# Patient Record
Sex: Male | Born: 1945 | Race: White | Hispanic: No | Marital: Married | Smoking: Former smoker
Health system: Southern US, Community
[De-identification: ages and names within clinical notes are randomized; demographics above are authoritative.]

## PROBLEM LIST (undated history)

## (undated) DIAGNOSIS — I2510 Atherosclerotic heart disease of native coronary artery without angina pectoris: Secondary | ICD-10-CM

## (undated) DIAGNOSIS — G2581 Restless legs syndrome: Secondary | ICD-10-CM

## (undated) DIAGNOSIS — M5136 Other intervertebral disc degeneration, lumbar region: Secondary | ICD-10-CM

## (undated) DIAGNOSIS — G4733 Obstructive sleep apnea (adult) (pediatric): Secondary | ICD-10-CM

## (undated) DIAGNOSIS — C61 Malignant neoplasm of prostate: Secondary | ICD-10-CM

## (undated) DIAGNOSIS — M51369 Other intervertebral disc degeneration, lumbar region without mention of lumbar back pain or lower extremity pain: Secondary | ICD-10-CM

## (undated) DIAGNOSIS — G4700 Insomnia, unspecified: Secondary | ICD-10-CM

## (undated) DIAGNOSIS — E119 Type 2 diabetes mellitus without complications: Secondary | ICD-10-CM

## (undated) DIAGNOSIS — I10 Essential (primary) hypertension: Secondary | ICD-10-CM

## (undated) DIAGNOSIS — K219 Gastro-esophageal reflux disease without esophagitis: Secondary | ICD-10-CM

## (undated) DIAGNOSIS — J45909 Unspecified asthma, uncomplicated: Secondary | ICD-10-CM

## (undated) DIAGNOSIS — J309 Allergic rhinitis, unspecified: Secondary | ICD-10-CM

## (undated) DIAGNOSIS — J189 Pneumonia, unspecified organism: Secondary | ICD-10-CM

## (undated) HISTORY — DX: Atherosclerotic heart disease of native coronary artery without angina pectoris: I25.10

## (undated) HISTORY — DX: Essential (primary) hypertension: I10

## (undated) HISTORY — DX: Other intervertebral disc degeneration, lumbar region: M51.36

## (undated) HISTORY — DX: Malignant neoplasm of prostate: C61

## (undated) HISTORY — DX: Unspecified asthma, uncomplicated: J45.909

## (undated) HISTORY — PX: TONSILLECTOMY: SUR1361

## (undated) HISTORY — DX: Obstructive sleep apnea (adult) (pediatric): G47.33

## (undated) HISTORY — PX: CORONARY ANGIOPLASTY: SHX604

## (undated) HISTORY — DX: Type 2 diabetes mellitus without complications: E11.9

## (undated) HISTORY — DX: Insomnia, unspecified: G47.00

## (undated) HISTORY — DX: Allergic rhinitis, unspecified: J30.9

## (undated) HISTORY — DX: Gastro-esophageal reflux disease without esophagitis: K21.9

## (undated) HISTORY — DX: Other intervertebral disc degeneration, lumbar region without mention of lumbar back pain or lower extremity pain: M51.369

---

## 1981-05-26 HISTORY — PX: OTHER SURGICAL HISTORY: SHX169

## 2002-05-17 ENCOUNTER — Ambulatory Visit (HOSPITAL_COMMUNITY): Admission: RE | Admit: 2002-05-17 | Discharge: 2002-05-17 | Payer: Self-pay | Admitting: Family Medicine

## 2002-05-17 ENCOUNTER — Encounter: Payer: Self-pay | Admitting: Family Medicine

## 2003-09-07 ENCOUNTER — Ambulatory Visit (HOSPITAL_COMMUNITY): Admission: RE | Admit: 2003-09-07 | Discharge: 2003-09-07 | Payer: Self-pay | Admitting: Family Medicine

## 2003-09-15 ENCOUNTER — Other Ambulatory Visit: Payer: Self-pay

## 2007-03-05 ENCOUNTER — Ambulatory Visit (HOSPITAL_COMMUNITY): Admission: RE | Admit: 2007-03-05 | Discharge: 2007-03-05 | Payer: Self-pay | Admitting: Family Medicine

## 2007-03-05 ENCOUNTER — Encounter: Payer: Self-pay | Admitting: Orthopedic Surgery

## 2007-04-12 ENCOUNTER — Ambulatory Visit: Payer: Self-pay | Admitting: Orthopedic Surgery

## 2007-04-12 DIAGNOSIS — M25569 Pain in unspecified knee: Secondary | ICD-10-CM

## 2007-04-12 DIAGNOSIS — I10 Essential (primary) hypertension: Secondary | ICD-10-CM | POA: Insufficient documentation

## 2007-04-12 DIAGNOSIS — M1710 Unilateral primary osteoarthritis, unspecified knee: Secondary | ICD-10-CM

## 2007-04-12 DIAGNOSIS — E1159 Type 2 diabetes mellitus with other circulatory complications: Secondary | ICD-10-CM | POA: Insufficient documentation

## 2009-05-26 HISTORY — PX: PROSTATECTOMY: SHX69

## 2009-12-11 ENCOUNTER — Encounter (INDEPENDENT_AMBULATORY_CARE_PROVIDER_SITE_OTHER): Payer: Self-pay | Admitting: *Deleted

## 2009-12-31 ENCOUNTER — Encounter: Payer: Self-pay | Admitting: Gastroenterology

## 2009-12-31 ENCOUNTER — Ambulatory Visit: Payer: Self-pay | Admitting: Gastroenterology

## 2009-12-31 DIAGNOSIS — D649 Anemia, unspecified: Secondary | ICD-10-CM

## 2009-12-31 DIAGNOSIS — K625 Hemorrhage of anus and rectum: Secondary | ICD-10-CM

## 2010-01-09 ENCOUNTER — Ambulatory Visit (HOSPITAL_COMMUNITY): Admission: RE | Admit: 2010-01-09 | Discharge: 2010-01-09 | Payer: Self-pay | Admitting: Gastroenterology

## 2010-01-09 ENCOUNTER — Ambulatory Visit: Payer: Self-pay | Admitting: Gastroenterology

## 2010-01-15 ENCOUNTER — Ambulatory Visit (HOSPITAL_COMMUNITY): Admission: RE | Admit: 2010-01-15 | Discharge: 2010-01-15 | Payer: Self-pay | Admitting: Family Medicine

## 2010-01-16 ENCOUNTER — Encounter: Payer: Self-pay | Admitting: Gastroenterology

## 2010-01-25 ENCOUNTER — Encounter: Payer: Self-pay | Admitting: Gastroenterology

## 2010-01-30 LAB — CONVERTED CEMR LAB
BUN: 20 mg/dL (ref 6–23)
Calcium: 9.1 mg/dL (ref 8.4–10.5)
Creatinine, Ser: 1.07 mg/dL (ref 0.40–1.50)
Ferritin: 21 ng/mL — ABNORMAL LOW (ref 22–322)

## 2010-06-13 ENCOUNTER — Encounter (INDEPENDENT_AMBULATORY_CARE_PROVIDER_SITE_OTHER): Payer: Self-pay | Admitting: *Deleted

## 2010-06-25 NOTE — Miscellaneous (Signed)
Summary: Orders Update  Clinical Lists Changes  Orders: Added new Test order of T-CBC w/Diff [phone]) - Signed Added new Test order of T-Basic Metabolic Panel [phone]) - Signed Added new Test order of T-Ferritin [phone]) - Signed

## 2010-06-25 NOTE — Assessment & Plan Note (Signed)
Summary: ANEMIA,CONSULT FOR TCS/SS   Visit Type:  consult Referring Provider:  Oval Linsey Primary Care Provider:  Oval Linsey  Chief Complaint:  anemia and consult for tcs.  History of Present Illness: Johnny Huber is a pleasant 65 y/o WM, patient of Dr. Janna Arch, who presents for further evaluation of anemia and colonoscopy. BM regular. No abd pain. Appetite good. No heartburn. No dysphagia. Some brbpr on toilet tissue occasionally. No melena. Weight stable. No prior TCS.   On Naproxen since 2007, for OA of right knee. Rarely takes Advil. No other NSAIDS.  Recent Hgb reportedly 9.6. Per patient, hemoccult negative X 3.  Current Medications (verified): 1)  Janumet 50-500 Mg Tabs (Sitagliptin-Metformin Hcl) .... Two Times A Day 2)  Naproxen 500 Mg Tabs (Naproxen) .... Two Times A Day 3)  Maxifed-G 40-400 Mg Tabs (Pseudoephedrine-Guaifenesin) .... Two Times A Day As Needed 4)  Diovan Hct 320-25 Mg Tabs (Valsartan-Hydrochlorothiazide) .... Once Daily 5)  Amlodipine Besylate 5 Mg Tabs (Amlodipine Besylate) .... Once Daily 6)  Cipro 500 Mg Tabs (Ciprofloxacin Hcl) .... Two Times A Day X 14 Days 7)  Iron Otc .... Two Daily  Allergies (verified): No Known Drug Allergies  Past History:  Past Medical History: High Blood Pressure Diabetes, diagnosed 1987 Sinusitis/allergies Prostatitis Arthritis  Past Surgical History: Reviewed history from 04/12/2007 and no changes required. knee  arthroscopy rt 1983/arthritis  Family History: FH heart disease No FH of CRC, liver, chronic GI illnesses. Mother, breast cancer.  Social History: Patient is married. One biological son. Two step-dgts. Quit tob in 1984. [place]. No alcohol, none in six years, used to drink socially.  Review of Systems General:  Denies fever, chills, sweats, anorexia, fatigue, weakness, and weight loss. Eyes:  Denies vision loss. ENT:  Denies nasal congestion, sore throat, hoarseness, and difficulty  swallowing. CV:  Denies chest pains, angina, palpitations, dyspnea on exertion, and peripheral edema. Resp:  Denies dyspnea at rest, dyspnea with exercise, cough, sputum, and wheezing. GI:  See HPI. GU:  Denies urinary burning and blood in urine. MS:  Complains of joint pain / LOM. Derm:  Denies rash and itching. Neuro:  Denies weakness, frequent headaches, memory loss, and confusion. Psych:  Denies depression and anxiety. Endo:  Denies unusual weight change. Heme:  Denies bruising and bleeding. Allergy:  Denies hives and rash.  Vital Signs:  Patient profile:   65 year old male Height:      71 inches Weight:      224 pounds BMI:     31.35 Temp:     98.3 degrees F oral Pulse rate:   76 / minute BP sitting:   112 / 70  (left arm) Cuff size:   regular  Vitals Entered By: Hendricks Limes LPN (December 31, 2009 10:39 AM)  Physical Exam  General:  Well developed, well nourished, no acute distress. Head:  Normocephalic and atraumatic. Eyes:  No sclera icterus. Mouth:  Oropharyngeal mucosa moist, pink.  No lesions, erythema or exudate.    Neck:  Supple; no masses or thyromegaly. Lungs:  Clear throughout to auscultation. Heart:  Regular rate and rhythm; no murmurs, rubs,  or bruits. Abdomen:  Bowel sounds normal.  Abdomen is soft, nontender, nondistended.  No rebound or guarding.  No hepatosplenomegaly, masses or hernias.  No abdominal bruits.  Rectal:  deferred until time of colonoscopy.   Extremities:  No clubbing, cyanosis, edema or deformities noted. Neurologic:  Alert and  oriented x4;  grossly normal neurologically. Skin:  Intact  without significant lesions or rashes. Cervical Nodes:  No significant cervical adenopathy. Psych:  Alert and cooperative. Normal mood and affect.  Impression & Recommendations:  Problem # 1:  ANEMIA (ICD-285.9)  Recent anemia. Heme negative X 3 per pt. Have requested records. He has brbpr on toilet tissue at times. Colonoscopy to be performed in near  future.  Risks, alternatives, and benefits including but not limited to the risk of reaction to medication, bleeding, infection, and perforation were addressed.  Patient voiced understanding and provided verbal consent.   Day of prep, 1/2 dose Janumet. Hold iron 7 days.  Orders: Consultation Level III (16109) I would like to thank Dr. Janna Arch for allowing [place] to take part in the care of this nice patient.  Appended Document: ANEMIA,CONSULT FOR TCS/SS ? NSAID COLITIS, polyp or less likely colon CA.

## 2010-06-25 NOTE — Letter (Signed)
Summary: Unable to Reach, Consult Scheduled  [facility]  [address]   [place], [place] [address]   Phone: [phone]  Fax: [phone]    12/11/2009  Johnny Huber [address] [place], [place]  [address] 30-Apr-1946   Dear Mr. Isidro,   We have been unable to reach you by phone.  Please contact our office with an updated phone number.  At the recommendation of DR DONDIEGO  we have been asked to schedule you a consult with DR FIELDS for ANEMIA/CONSULT FOR COLONOSCOPY.  Your appointment is scheduled for  12/31/09 @ 1030AM . If you are unable to keep this appointment, or if you have any questions,  please call our office at [phone].     Thank you,    Diana Eves  [facility] R. Roetta Sessions, M.D.    Jonette Eva, M.D. Lorenza Burton, FNP-BC    Tana Coast, PA-C Phone: [phone]    Fax: [phone]

## 2010-06-25 NOTE — Letter (Signed)
Summary: TCS Order   TCS Order   Imported By: Peggyann Shoals 12/31/2009 11:53:23  _____________________________________________________________________  External Attachment:    Type:   Image     Comment:   External Document

## 2010-06-27 NOTE — Letter (Signed)
Summary: Recall Office Visit  [facility]  [address]   [place], [place] [address]   Phone: [phone]  Fax: [phone]      June 13, 2010   Johnny Huber [address] [place], [place]  [address] 04-13-1946   Dear Johnny Huber,   According to our records, it is time for you to schedule a follow-up office visit with [place].   At your convenience, please call [phone] to schedule an office visit. If you have any questions, concerns, or feel that this letter is in error, we would appreciate your call.   Sincerely,    Diana Eves  [facility] Ph: [phone]   Fax: [phone]  Appended Document: Recall Office Visit PATIENT IS FOLLOWING ONCOLOGY WITH LABS AND RIGHT NOW DOES NOT NEED GASTROENTEROLOGY FOLLOW UP PER PATIENTS WIFE

## 2010-08-09 LAB — BASIC METABOLIC PANEL
CO2: 26 mEq/L (ref 19–32)
Calcium: 8.6 mg/dL (ref 8.4–10.5)
Chloride: 105 mEq/L (ref 96–112)
Creatinine, Ser: 1.09 mg/dL (ref 0.4–1.5)
GFR calc Af Amer: >60 mL/min (ref >60)
Glucose, Bld: 140 mg/dL — ABNORMAL HIGH (ref 70–99)
Potassium: 4.1 mEq/L (ref 3.5–5.1)
Sodium: 135 mEq/L (ref 135–145)

## 2010-08-09 LAB — CBC
HCT: 35.1 % — ABNORMAL LOW (ref 39.0–52.0)
Hemoglobin: 11.5 g/dL — ABNORMAL LOW (ref 13.0–17.0)
MCV: 86.9 fL (ref 78.0–100.0)
RBC: 4.04 MIL/uL — ABNORMAL LOW (ref 4.22–5.81)
RDW: 19.8 % — ABNORMAL HIGH (ref 11.5–15.5)
WBC: 3.7 10*3/uL — ABNORMAL LOW (ref 4.0–10.5)

## 2010-08-09 LAB — FERRITIN: Ferritin: 23 ng/mL (ref 22–322)

## 2013-05-26 HISTORY — PX: CARDIAC CATHETERIZATION: SHX172

## 2016-01-31 ENCOUNTER — Encounter: Payer: Self-pay | Admitting: *Deleted

## 2016-01-31 LAB — HEMOGLOBIN A1C: Hemoglobin A1C: 10.2

## 2016-02-01 ENCOUNTER — Encounter: Payer: Self-pay | Admitting: Cardiology

## 2016-02-01 ENCOUNTER — Ambulatory Visit (INDEPENDENT_AMBULATORY_CARE_PROVIDER_SITE_OTHER): Payer: Medicare Other | Admitting: Cardiology

## 2016-02-01 VITALS — BP 127/72 | HR 62 | Ht 71.0 in | Wt 251.0 lb

## 2016-02-01 DIAGNOSIS — I2510 Atherosclerotic heart disease of native coronary artery without angina pectoris: Secondary | ICD-10-CM | POA: Diagnosis not present

## 2016-02-01 DIAGNOSIS — E785 Hyperlipidemia, unspecified: Secondary | ICD-10-CM | POA: Diagnosis not present

## 2016-02-01 DIAGNOSIS — Z139 Encounter for screening, unspecified: Secondary | ICD-10-CM

## 2016-02-01 DIAGNOSIS — I10 Essential (primary) hypertension: Secondary | ICD-10-CM | POA: Diagnosis not present

## 2016-02-01 MED ORDER — ATORVASTATIN CALCIUM 40 MG PO TABS: 40.0000 mg | ORAL_TABLET | Freq: Every day | ORAL | 3 refills | Status: DC

## 2016-02-01 NOTE — Progress Notes (Signed)
Clinical Summary Mr. Hockey is a 70 y.o.male seen today as a new patient, referred by Dr Wenda Overland. He is a former patient of Dr Hamilton Capri at [facility] cardiology  1. CAD - pci to RCA Nov 2015 after abnormal stress test. Normal LV function at that time.  -  No recent chest pain. No SOB or DOE.  - compliant with meds  2. DM2 - followed by pcp  3. HTN - compliant with meds  4. Hyperlipidemia - unclear why not on statin. Last pcp note lists he had been on atorvastatin.  - 01/2016 TC 147 TG 205 HDL 28 LDL 77  5. AAA screen - former smoker x 11 years  Past Medical History:  Diagnosis Date  . Asthmatic bronchitis   . Coronary atherosclerosis of native coronary artery   . DDD (degenerative disc disease), lumbar   . Diabetes ([place])   . Gastroesophageal reflux disease   . Hypertension   . Insomnia, unspecified   . Obstructive sleep apnea   . Prostate cancer ([place])   . Rhinitis, allergic      Allergies no known allergies   Current Outpatient Prescriptions  Medication Sig Dispense Refill  . albuterol (PROVENTIL HFA;VENTOLIN HFA) 108 (90 Base) MCG/ACT inhaler Inhale into the lungs every 6 (six) hours as needed for wheezing or shortness of breath.    Marland Kitchen amLODipine (NORVASC) 10 MG tablet Take 10 mg by mouth daily.    Marland Kitchen aspirin EC 81 MG tablet Take 81 mg by mouth daily.    Marland Kitchen atorvastatin (LIPITOR) 20 MG tablet Take 20 mg by mouth daily.    . cetirizine (ZYRTEC) 10 MG tablet Take 10 mg by mouth daily.    . Cholecalciferol (VITAMIN D3) 1000 units CAPS Take by mouth daily.    . citalopram (CELEXA) 20 MG tablet Take 20 mg by mouth daily.    Marland Kitchen glimepiride (AMARYL) 4 MG tablet Take 4 mg by mouth daily with breakfast.    . glucose monitoring kit (FREESTYLE) monitoring kit 1 each by Does not apply route as needed for other.    . metFORMIN (GLUCOPHAGE) 500 MG tablet Take 500 mg by mouth 2 (two) times daily with a meal.    . mometasone (NASONEX) 50 MCG/ACT nasal spray Place 2 sprays into the  nose daily.    . nitroGLYCERIN (NITROSTAT) 0.4 MG SL tablet Place 0.4 mg under the tongue every 5 (five) minutes as needed for chest pain.    . prasugrel (EFFIENT) 10 MG TABS tablet Take 10 mg by mouth daily.    . sitaGLIPtin (JANUVIA) 100 MG tablet Take 100 mg by mouth daily.    . traMADol (ULTRAM) 50 MG tablet Take 50 mg by mouth every 6 (six) hours as needed.    . valsartan-hydrochlorothiazide (DIOVAN-HCT) 160-12.5 MG tablet Take 1 tablet by mouth daily.     No current facility-administered medications for this visit.      Past Surgical History:  Procedure Laterality Date  . CARDIAC CATHETERIZATION  2015  . OTHER SURGICAL HISTORY  1983   R KNEE ARTHORSCOPY     Allergies no known allergies    Family History  Problem Relation Age of Onset  . Family history unknown: Yes     Social History Mr. Golebiewski reports that he has quit smoking. He does not have any smokeless tobacco history on file. Mr. Mcpartlin has no alcohol history on file.   Review of Systems CONSTITUTIONAL: No weight loss, fever, chills, weakness or fatigue.  HEENT: Eyes: No visual loss, blurred vision, double vision or yellow sclerae.No hearing loss, sneezing, congestion, runny nose or sore throat.  SKIN: No rash or itching.  CARDIOVASCULAR: per HPI RESPIRATORY: No shortness of breath, cough or sputum.  GASTROINTESTINAL: No anorexia, nausea, vomiting or diarrhea. No abdominal pain or blood.  GENITOURINARY: No burning on urination, no polyuria NEUROLOGICAL: No headache, dizziness, syncope, paralysis, ataxia, numbness or tingling in the extremities. No change in bowel or bladder control.  MUSCULOSKELETAL: No muscle, back pain, joint pain or stiffness.  LYMPHATICS: No enlarged nodes. No history of splenectomy.  PSYCHIATRIC: No history of depression or anxiety.  ENDOCRINOLOGIC: No reports of sweating, cold or heat intolerance. No polyuria or polydipsia.  Marland Kitchen   Physical Examination Vitals:   02/01/16 0835  BP:  127/72  Pulse: 62   Vitals:   02/01/16 0835  Weight: 251 lb (113.9 kg)  Height: _0  (1.803 m)    Gen: resting comfortably, no acute distress HEENT: no scleral icterus, pupils equal round and reactive, no palptable cervical adenopathy,  CV: RRR, no m/r/g, no jvd Resp: Clear to auscultation bilaterally GI: abdomen is soft, non-tender, non-distended, normal bowel sounds, no hepatosplenomegaly MSK: extremities are warm, no edema.  Skin: warm, no rash Neuro:  no focal deficits Psych: appropriate affect      Assessment and Plan  1. CAD - no recent symptoms, no indicaiton for continued effient, we will d/c - EKG in clinic shows NSR, no ischemic changes  2 .HTN - at goal, continue current meds  3. Hyperlipidemia - restart statin, atorva 78m daily  4. AAA screen  male over 89with tobacco history, order AAA screening [place]     JArnoldo Lenis M.D.

## 2016-02-01 NOTE — Patient Instructions (Signed)
Medication Instructions:   Stop Effient.  Increase Atorvastatin to 40mg  daily.  Continue all other medications.    Labwork: none  Testing/Procedures:  Your physician has requested that you have an abdominal aorta duplex. During this test, an ultrasound is used to evaluate the aorta. Allow 30 minutes for this exam. Do not eat after midnight the day before and avoid carbonated beverages  Office will contact with results via phone or letter.    Follow-Up: Your physician wants you to follow up in: 6 months.  You will receive a reminder letter in the mail one-two months in advance.  If you don't receive a letter, please call our office to schedule the follow up appointment   Any Other Special Instructions Will Be Listed Below (If Applicable).  If you need a refill on your cardiac medications before your next appointment, please call your pharmacy.

## 2016-03-11 ENCOUNTER — Ambulatory Visit (INDEPENDENT_AMBULATORY_CARE_PROVIDER_SITE_OTHER): Payer: Medicare Other | Admitting: "Endocrinology

## 2016-03-11 ENCOUNTER — Encounter: Payer: Self-pay | Admitting: "Endocrinology

## 2016-03-11 VITALS — BP 109/65 | HR 65 | Ht 71.0 in | Wt 252.0 lb

## 2016-03-11 DIAGNOSIS — I10 Essential (primary) hypertension: Secondary | ICD-10-CM

## 2016-03-11 DIAGNOSIS — IMO0002 Reserved for concepts with insufficient information to code with codable children: Secondary | ICD-10-CM

## 2016-03-11 DIAGNOSIS — E1165 Type 2 diabetes mellitus with hyperglycemia: Secondary | ICD-10-CM

## 2016-03-11 DIAGNOSIS — E1159 Type 2 diabetes mellitus with other circulatory complications: Secondary | ICD-10-CM | POA: Diagnosis not present

## 2016-03-11 DIAGNOSIS — E7800 Pure hypercholesterolemia, unspecified: Secondary | ICD-10-CM

## 2016-03-11 DIAGNOSIS — I2510 Atherosclerotic heart disease of native coronary artery without angina pectoris: Secondary | ICD-10-CM | POA: Diagnosis not present

## 2016-03-11 DIAGNOSIS — Z6834 Body mass index (BMI) 34.0-34.9, adult: Secondary | ICD-10-CM

## 2016-03-11 DIAGNOSIS — E6609 Other obesity due to excess calories: Secondary | ICD-10-CM | POA: Diagnosis not present

## 2016-03-11 DIAGNOSIS — E118 Type 2 diabetes mellitus with unspecified complications: Secondary | ICD-10-CM | POA: Diagnosis not present

## 2016-03-11 NOTE — Progress Notes (Signed)
Subjective:    Patient ID: Johnny Huber, male    DOB: 12-31-1945. Patient is being seen in consultation for management of diabetes requested by  Celedonio Savage, MD  Past Medical History:  Diagnosis Date  . Asthmatic bronchitis   . Coronary atherosclerosis of native coronary artery   . DDD (degenerative disc disease), lumbar   . Diabetes ([place])   . Gastroesophageal reflux disease   . Hypertension   . Insomnia, unspecified   . Obstructive sleep apnea   . Prostate cancer ([place])   . Rhinitis, allergic    Past Surgical History:  Procedure Laterality Date  . CARDIAC CATHETERIZATION  2015  . OTHER SURGICAL HISTORY  1983   R KNEE ARTHORSCOPY   Social History   Social History  . Marital status: Married    Spouse name: N/A  . Number of children: N/A  . Years of education: N/A   Social History Main Topics  . Smoking status: Former Smoker    Quit date: 09/01/1982  . Smokeless tobacco: Never Used  . Alcohol use No  . Drug use: No  . Sexual activity: Yes    Partners: Female   Other Topics Concern  . None   Social History Narrative  . None   Outpatient Encounter Prescriptions as of 03/11/2016  Medication Sig  . albuterol (PROVENTIL HFA;VENTOLIN HFA) 108 (90 Base) MCG/ACT inhaler Inhale into the lungs every 6 (six) hours as needed for wheezing or shortness of breath.  Marland Kitchen amLODipine (NORVASC) 10 MG tablet Take 10 mg by mouth daily.  Marland Kitchen aspirin EC 81 MG tablet Take 81 mg by mouth daily.  Marland Kitchen atorvastatin (LIPITOR) 40 MG tablet Take 1 tablet (40 mg total) by mouth daily.  . cetirizine (ZYRTEC) 10 MG tablet Take 10 mg by mouth daily.  . Cholecalciferol (VITAMIN D3) 1000 units CAPS Take by mouth daily.  . citalopram (CELEXA) 20 MG tablet Take 20 mg by mouth daily.  Marland Kitchen gabapentin (NEURONTIN) 300 MG capsule Take 300 mg by mouth 3 (three) times daily.   Marland Kitchen glucose monitoring kit (FREESTYLE) monitoring kit 1 each by Does not apply route as needed for other.  . metFORMIN (GLUCOPHAGE) 500  MG tablet Take 500 mg by mouth 2 (two) times daily with a meal.  . mometasone (NASONEX) 50 MCG/ACT nasal spray Place 2 sprays into the nose daily.  . nitroGLYCERIN (NITROSTAT) 0.4 MG SL tablet Place 0.4 mg under the tongue every 5 (five) minutes as needed for chest pain.  . sitaGLIPtin (JANUVIA) 100 MG tablet Take 100 mg by mouth daily.  . traMADol (ULTRAM) 50 MG tablet Take 50 mg by mouth every 6 (six) hours as needed.  . valsartan-hydrochlorothiazide (DIOVAN-HCT) 160-12.5 MG tablet Take 1 tablet by mouth daily.  . [DISCONTINUED] glimepiride (AMARYL) 4 MG tablet Take 4 mg by mouth daily with breakfast.   No facility-administered encounter medications on file as of 03/11/2016.    ALLERGIES: No Known Allergies VACCINATION STATUS:  There is no immunization history on file for this patient.  Diabetes  He presents for his initial diabetic visit. He has type 2 diabetes mellitus. Onset time: He was diagnosed at approximate age of 32 years. His disease course has been worsening. There are no hypoglycemic associated symptoms. Pertinent negatives for hypoglycemia include no confusion, headaches, pallor or seizures. Associated symptoms include blurred vision, polydipsia and polyuria. Pertinent negatives for diabetes include no chest pain, no fatigue, no polyphagia and no weakness. There are no hypoglycemic complications. Symptoms are  worsening. Risk factors for coronary artery disease include diabetes mellitus, dyslipidemia, hypertension, family history, male sex, obesity, sedentary lifestyle and tobacco exposure. Current diabetic treatment includes oral agent (triple therapy) (He is on Januvia 100 mg by mouth daily, metformin 500 mg by mouth twice a day, glimepiride 4 mg by mouth daily). His weight is stable. He is following a generally unhealthy diet. When asked about meal planning, he reported none. He has not had a previous visit with a dietitian. He rarely participates in exercise. Home blood sugar  record trend: He brought a log showing monitoring one time a day averaging between 120 and 200. His overall blood glucose range is 180-200 mg/dl. An ACE inhibitor/angiotensin II receptor blocker is being taken. Eye exam is current.  Hyperlipidemia  This is a chronic problem. The current episode started more than 1 year ago. Pertinent negatives include no chest pain, myalgias or shortness of breath. Current antihyperlipidemic treatment includes statins. Risk factors for coronary artery disease include dyslipidemia, diabetes mellitus, hypertension, male sex, obesity and a sedentary lifestyle.  Hypertension  This is a chronic problem. The current episode started more than 1 year ago. Associated symptoms include blurred vision. Pertinent negatives include no chest pain, headaches, neck pain, palpitations or shortness of breath. Risk factors for coronary artery disease include smoking/tobacco exposure, male gender, obesity, dyslipidemia, diabetes mellitus, sedentary lifestyle and family history. Past treatments include ACE inhibitors.       Review of Systems  Constitutional: Negative for chills, fatigue, fever and unexpected weight change.  HENT: Negative for dental problem, mouth sores and trouble swallowing.   Eyes: Positive for blurred vision. Negative for visual disturbance.  Respiratory: Negative for cough, choking, chest tightness, shortness of breath and wheezing.   Cardiovascular: Negative for chest pain, palpitations and leg swelling.  Gastrointestinal: Negative for abdominal distention, abdominal pain, constipation, diarrhea, nausea and vomiting.  Endocrine: Positive for polydipsia and polyuria. Negative for polyphagia.  Genitourinary: Negative for dysuria, flank pain, hematuria and urgency.  Musculoskeletal: Negative for back pain, gait problem, myalgias and neck pain.  Skin: Negative for pallor, rash and wound.  Neurological: Negative for seizures, syncope, weakness, numbness and  headaches.  Psychiatric/Behavioral: Negative.  Negative for confusion and dysphoric mood.    Objective:    BP 109/65   Pulse 65   Ht '5\' 11"'$  (1.803 m)   Wt 252 lb (114.3 kg)   BMI 35.15 kg/m   Wt Readings from Last 3 Encounters:  03/11/16 252 lb (114.3 kg)  02/01/16 251 lb (113.9 kg)  01/30/16 252 lb (114.3 kg)    Physical Exam  Constitutional: He is oriented to person, place, and time. He appears well-developed. He is cooperative. No distress.  HENT:  Head: Normocephalic and atraumatic.  Eyes: EOM are normal.  Neck: Normal range of motion. Neck supple. No tracheal deviation present. No thyromegaly present.  Cardiovascular: Normal rate, S1 normal, S2 normal and normal heart sounds.  Exam reveals no gallop.   No murmur heard. Pulses:      Dorsalis pedis pulses are 1+ on the right side, and 1+ on the left side.       Posterior tibial pulses are 1+ on the right side, and 1+ on the left side.  Pulmonary/Chest: Breath sounds normal. No respiratory distress. He has no wheezes.  Abdominal: Soft. Bowel sounds are normal. He exhibits no distension. There is no tenderness. There is no guarding and no CVA tenderness.  Musculoskeletal: He exhibits no edema.  Right shoulder: He exhibits no swelling and no deformity.  Neurological: He is alert and oriented to person, place, and time. He has normal strength and normal reflexes. No cranial nerve deficit or sensory deficit. Gait normal.  Skin: Skin is warm and dry. No rash noted. No cyanosis. Nails show no clubbing.  Psychiatric: He has a normal mood and affect. His speech is normal and behavior is normal. Judgment and thought content normal. Cognition and memory are normal.     CMP ( most recent) CMP     Component Value Date/Time   NA 135 01/25/2010 2350   K 4.0 01/25/2010 2350   CL 97 01/25/2010 2350   CO2 25 01/25/2010 2350   GLUCOSE 238 (H) 01/25/2010 2350   BUN 20 01/25/2010 2350   CREATININE 1.07 01/25/2010 2350   CALCIUM  9.1 01/25/2010 2350   GFRNONAA >60 01/09/2010 1138   GFRAA  01/09/2010 1138    >60        The eGFR has been calculated using the MDRD equation. This calculation has not been validated in all clinical situations. eGFR's persistently <60 mL/min signify possible Chronic Kidney Disease.       Assessment & Plan:   1. Uncontrolled type 2 diabetes mellitus with complication, without long-term current use of insulin ([place])   - Patient has currently uncontrolled symptomatic type 2 DM since  70 years of age,  with most recent A1c of 10.2 %. Recent labs reviewed.   His diabetes is complicated by coronary artery disease requiring stent placement, obesity/sedentary life and patient remains at a high risk for more acute and chronic complications of diabetes which include CAD, CVA, CKD, retinopathy, and neuropathy. These are all discussed in detail with the patient.  - I have counseled the patient on diet management and weight loss, by adopting a carbohydrate restricted/protein rich diet.  - Suggestion is made for patient to avoid simple carbohydrates   from their diet including Cakes , Desserts, Ice Cream,  Soda (  diet and regular) , Sweet Tea , Candies,  Chips, Cookies, Artificial Sweeteners,   and "Sugar-free" Products . This will help patient to have stable blood glucose profile and potentially avoid unintended weight gain.  - I encouraged the patient to switch to  unprocessed or minimally processed complex starch and increased protein intake (animal or plant source), fruits, and vegetables.  - Patient is advised to stick to a routine mealtimes to eat 3 meals  a day and avoid unnecessary snacks ( to snack only to correct hypoglycemia).  - The patient will be scheduled with Jearld Fenton, RDN, CDE for individualized DM education.  - I have approached patient with the following individualized plan to manage diabetes and patient agrees:   - Given his significant glycemic burden, he will  likely need insulin therapy. However, it is essential to assure his commitment for monitoring blood glucose properly. - I  will proceed to initiate strict monitoring of glucose  AC and HS. - He will return in 1 week with meter and logs. - I did not prescribe insulin today, however I advised him to continue metformin 500 mg by mouth twice a day and Januvia 100 mg by mouth every morning. -Patient is encouraged to call clinic for blood glucose levels less than 70 or above 300 mg /dl.  - I will discontinue glimepiride, risk outweighs benefit for this patient.  - Patient will be considered for  GLP1 therapy as appropriate next visit. - Patient specific target  A1c;  LDL, HDL, Triglycerides, and  Waist Circumference were discussed in detail.  2) BP/HTN: Controlled. Continue current medications including ACEI/ARB. 3) Lipids/HPL:  Control unknown, continue statins. 4)  Weight/Diet: CDE Consult will be initiated , exercise, and detailed carbohydrates information provided.  5) Chronic Care/Health Maintenance:  -Patient is on ACEI/ARB and Statin medications and encouraged to continue to follow up with Ophthalmology, Podiatrist at least yearly or according to recommendations, and advised to  stay away from smoking. I have recommended yearly flu vaccine and pneumonia vaccination at least every 5 years; moderate intensity exercise for up to 150 minutes weekly; and  sleep for at least 7 hours a day.  - 60 minutes of time was spent on the care of this patient , 50% of which was applied for counseling on diabetes complications and their preventions.  - Patient to bring meter and  blood glucose logs during their next visit.   - I advised patient to maintain close follow up with Celedonio Savage, MD for primary care needs.  Follow up plan: - Return in about 1 week (around 03/18/2016) for follow up with meter and logs- no labs.  Glade Lloyd, MD Phone: [phone]  Fax: [phone]   03/11/2016, 4:20 PM

## 2016-03-11 NOTE — Patient Instructions (Signed)

## 2016-03-17 ENCOUNTER — Telehealth: Payer: Self-pay | Admitting: Cardiology

## 2016-03-17 NOTE — Telephone Encounter (Signed)
Wife calleded about oxygen levels being in the low to medium 80s at rest   She is very concerned about him not feeling well

## 2016-03-17 NOTE — Telephone Encounter (Signed)
Pt wife aware

## 2016-03-17 NOTE — Telephone Encounter (Signed)
Initial workup for this would best be with his primary, who would be able to consider a broad range of possible causes and obtain initial sets of tests to help narrow down the possible causes. If after there initial workup they think its a heart related problem we would be happy to see him.    Zandra Abts MD

## 2016-03-17 NOTE — Telephone Encounter (Signed)
Pt wife says O2 was 86% - has been this way for 1 month - HR 66 BP WNL - denies chest pain SOB dizziness - says that for the last month pt has been sleeping a lot more (up to 4 hour naps daily) and remains tired most of the time. Will forward to Dr Harl Bowie for further suggestions.

## 2016-03-18 ENCOUNTER — Encounter: Payer: Self-pay | Admitting: Nutrition

## 2016-03-18 ENCOUNTER — Encounter: Payer: Medicare Other | Attending: "Endocrinology | Admitting: Nutrition

## 2016-03-18 VITALS — Ht 71.0 in | Wt 246.0 lb

## 2016-03-18 DIAGNOSIS — Z713 Dietary counseling and surveillance: Secondary | ICD-10-CM | POA: Insufficient documentation

## 2016-03-18 DIAGNOSIS — E118 Type 2 diabetes mellitus with unspecified complications: Secondary | ICD-10-CM | POA: Diagnosis not present

## 2016-03-18 DIAGNOSIS — E669 Obesity, unspecified: Secondary | ICD-10-CM

## 2016-03-18 DIAGNOSIS — E1165 Type 2 diabetes mellitus with hyperglycemia: Secondary | ICD-10-CM | POA: Diagnosis not present

## 2016-03-18 DIAGNOSIS — I10 Essential (primary) hypertension: Secondary | ICD-10-CM

## 2016-03-18 DIAGNOSIS — E7800 Pure hypercholesterolemia, unspecified: Secondary | ICD-10-CM

## 2016-03-18 NOTE — Patient Instructions (Addendum)
Goals 1. Follow My Plate 2. Eat 3-4 carb choices per meal (45-60 grams per meal) 3. No snacks between meals unless veggies or protein 4. Drink only water; no peach tea. May have UNSWEETENED tea without sweetners. Try adding a slice of fruit in water if desired. 5. Exercise 30 minutes daily. 6. Increase low carb vegetables. 7. Get A1C down to 7%

## 2016-03-18 NOTE — Progress Notes (Signed)
  Medical Nutrition Therapy:  Appt start time: 1100 end time:  1200.   Assessment:  Primary concerns today: Diabetes. Type 2. Lives with his wife. His wife does cooking and shopping.  Eats three meals per day.  Didn't bring meter and BS logs. Family history of DM. Just lost a  Niece from Type 1 DM.  FBS: 150-160's  PM 160-300's at night. Lab Results  Component Value Date   HGBA1C 10.2 01/31/2016      Preferred Learning Style:    Auditory  Visual  Hands on  Learning Readiness:   Ready  Change in progress  MEDICATIONS:    DIETARY INTAKE: 24-hr recall:  B ( AM): Sausage 2 patties, blueberry waffles, smalls 2, and sugar free syrup, water Snk ( AM): apples,  L ( PM): Steak and potato soup, cracker 6, water Snk ( PM): apple 2 water D ( PM): Pork loin, green beans, white cheddar mac and cheese and peaches, water Snk ( PM): apple,  Beverages: water  Usual physical activity:  ADL   Estimated energy needs: 1800 calories 200 g carbohydrates 135 g protein 50 g fat  Progress Towards Goal(s):  In progress.   Nutritional Diagnosis:  NB-1.1 Food and nutrition-related knowledge deficit As related to Diabetes.  As evidenced by A1C 10.2%.    Intervention: Nutrition and Diabetes education provided on My Plate, CHO counting, meal planning, portion sizes, timing of meals, avoiding snacks between meals unless having a low blood sugar, target ranges for A1C and blood sugars, signs/symptoms and treatment of hyper/hypoglycemia, monitoring blood sugars, taking medications as prescribed, benefits of exercising 30 minutes per day and prevention of complications of DM.  Goals 1. Follow My Plate 2. Eat 3-4 carb choices per meal (45-60 grams per meal) 3. No snacks between meals unless veggies or protein 4. Drink only water; no peach tea. May have UNSWEETENED tea without sweetners. Try adding a slice of fruit in water if desired. 5. Exercise 30 minutes daily. 6. Increase low carb  vegetables. 7. Get A1C down to 7%  Teaching Method Utilized:  Visual Auditory Hands on  Handouts given during visit include:  The Plate Method  Meal Plan Card  Diabetes Instructions.   Barriers to learning/adherence to lifestyle change: None  Demonstrated degree of understanding via:  Teach Back   Monitoring/Evaluation:  Dietary intake, exercise, meal planning, SBG, and body weight in 1 month(s). He would benefit from long acting insulin to help improve blood sugars.

## 2016-03-19 ENCOUNTER — Encounter: Payer: Self-pay | Admitting: "Endocrinology

## 2016-03-19 ENCOUNTER — Ambulatory Visit (INDEPENDENT_AMBULATORY_CARE_PROVIDER_SITE_OTHER): Payer: Medicare Other | Admitting: "Endocrinology

## 2016-03-19 VITALS — BP 100/64 | HR 68 | Ht 71.0 in | Wt 246.0 lb

## 2016-03-19 DIAGNOSIS — I2510 Atherosclerotic heart disease of native coronary artery without angina pectoris: Secondary | ICD-10-CM | POA: Diagnosis not present

## 2016-03-19 DIAGNOSIS — I10 Essential (primary) hypertension: Secondary | ICD-10-CM | POA: Diagnosis not present

## 2016-03-19 DIAGNOSIS — E1159 Type 2 diabetes mellitus with other circulatory complications: Secondary | ICD-10-CM

## 2016-03-19 DIAGNOSIS — E7800 Pure hypercholesterolemia, unspecified: Secondary | ICD-10-CM | POA: Diagnosis not present

## 2016-03-19 DIAGNOSIS — Z6834 Body mass index (BMI) 34.0-34.9, adult: Secondary | ICD-10-CM

## 2016-03-19 DIAGNOSIS — E6609 Other obesity due to excess calories: Secondary | ICD-10-CM | POA: Diagnosis not present

## 2016-03-19 MED ORDER — INSULIN PEN NEEDLE 31G X 8 MM MISC: 1.0000 | 3 refills | Status: AC

## 2016-03-19 MED ORDER — INSULIN GLARGINE 100 UNIT/ML SOLOSTAR PEN: 20.0000 [IU] | PEN_INJECTOR | Freq: Every day | SUBCUTANEOUS | 2 refills | Status: DC

## 2016-03-19 NOTE — Patient Instructions (Signed)
Advice for weight management -For most of us the best way to lose weight is by diet management. Generally speaking, diet management means restricting carbohydrate consumption to minimum possible (and to unprocessed or minimally processed complex starch) and increasing protein intake (animal or plant source), fruits, and vegetables.  -Sticking to a routine mealtime to eat 3 meals a day and avoiding unnecessary snacks is shown to have a big role in weight control.  -It is better to avoid simple carbohydrates including: Cakes, Desserts, Ice Cream, Soda (diet and regular), Sweet Tea, Candies, Chips, Cookies, Artificial Sweeteners, and "Sugar-free" Products.   -Exercise: 30 minutes a day 3-4 days a week, or 150 minutes a week. Combine stretch, strength, and aerobic activities. You may seek evaluation by your heart doctor prior to initiating exercise if you have high risk for heart disease.  -If you are interested, we can schedule a visit with Johnny Huber, RDN, CDE for individualized nutrition education.  

## 2016-03-19 NOTE — Progress Notes (Signed)
Subjective:    Patient ID: Johnny Huber, male    DOB: Jun 02, 1945. Patient is being seen in consultation for management of diabetes requested by  Celedonio Savage, MD  Past Medical History:  Diagnosis Date  . Asthmatic bronchitis   . Coronary atherosclerosis of native coronary artery   . DDD (degenerative disc disease), lumbar   . Diabetes ([place])   . Gastroesophageal reflux disease   . Hypertension   . Insomnia, unspecified   . Obstructive sleep apnea   . Prostate cancer ([place])   . Rhinitis, allergic    Past Surgical History:  Procedure Laterality Date  . CARDIAC CATHETERIZATION  2015  . OTHER SURGICAL HISTORY  1983   R KNEE ARTHORSCOPY   Social History   Social History  . Marital status: Married    Spouse name: N/A  . Number of children: N/A  . Years of education: N/A   Social History Main Topics  . Smoking status: Former Smoker    Quit date: 09/01/1982  . Smokeless tobacco: Never Used  . Alcohol use No  . Drug use: No  . Sexual activity: Yes    Partners: Female   Other Topics Concern  . None   Social History Narrative  . None   Outpatient Encounter Prescriptions as of 03/19/2016  Medication Sig  . albuterol (PROVENTIL HFA;VENTOLIN HFA) 108 (90 Base) MCG/ACT inhaler Inhale into the lungs every 6 (six) hours as needed for wheezing or shortness of breath.  Marland Kitchen amLODipine (NORVASC) 10 MG tablet Take 10 mg by mouth daily.  Marland Kitchen aspirin EC 81 MG tablet Take 81 mg by mouth daily.  Marland Kitchen atorvastatin (LIPITOR) 40 MG tablet Take 1 tablet (40 mg total) by mouth daily.  . cetirizine (ZYRTEC) 10 MG tablet Take 10 mg by mouth daily.  . Cholecalciferol (VITAMIN D3) 1000 units CAPS Take by mouth daily.  . citalopram (CELEXA) 20 MG tablet Take 20 mg by mouth daily.  Marland Kitchen gabapentin (NEURONTIN) 300 MG capsule Take 300 mg by mouth 3 (three) times daily.   Marland Kitchen glucose monitoring kit (FREESTYLE) monitoring kit 1 each by Does not apply route as needed for other.  . Insulin Glargine (LANTUS  SOLOSTAR) 100 UNIT/ML Solostar Pen Inject 20 Units into the skin daily at 10 pm.  . Insulin Pen Needle (B-D ULTRAFINE III SHORT PEN) 31G X 8 MM MISC 1 each by Does not apply route as directed.  . metFORMIN (GLUCOPHAGE) 500 MG tablet Take 500 mg by mouth 2 (two) times daily with a meal.  . mometasone (NASONEX) 50 MCG/ACT nasal spray Place 2 sprays into the nose daily.  . nitroGLYCERIN (NITROSTAT) 0.4 MG SL tablet Place 0.4 mg under the tongue every 5 (five) minutes as needed for chest pain.  . sitaGLIPtin (JANUVIA) 100 MG tablet Take 100 mg by mouth daily.  . traMADol (ULTRAM) 50 MG tablet Take 50 mg by mouth every 6 (six) hours as needed.  . valsartan-hydrochlorothiazide (DIOVAN-HCT) 160-12.5 MG tablet Take 1 tablet by mouth daily.   No facility-administered encounter medications on file as of 03/19/2016.    ALLERGIES: No Known Allergies VACCINATION STATUS:  There is no immunization history on file for this patient.  Diabetes  He presents for his follow-up diabetic visit. He has type 2 diabetes mellitus. Onset time: He was diagnosed at approximate age of 29 years. His disease course has been worsening. There are no hypoglycemic associated symptoms. Pertinent negatives for hypoglycemia include no confusion, headaches, pallor or seizures. Associated symptoms  include blurred vision, polydipsia and polyuria. Pertinent negatives for diabetes include no chest pain, no fatigue, no polyphagia and no weakness. There are no hypoglycemic complications. Symptoms are worsening. Risk factors for coronary artery disease include diabetes mellitus, dyslipidemia, hypertension, family history, male sex, obesity, sedentary lifestyle and tobacco exposure. Current diabetic treatment includes oral agent (triple therapy) (He is on Januvia 100 mg by mouth daily, metformin 500 mg by mouth twice a day, glimepiride 4 mg by mouth daily). His weight is decreasing steadily. He is following a generally unhealthy diet. When asked  about meal planning, he reported none. He has not had a previous visit with a dietitian. He rarely participates in exercise. Home blood sugar record trend: His average blood glucose for the last 7 days is 216 of 24 readings. His breakfast blood glucose range is generally >200 mg/dl. His lunch blood glucose range is generally >200 mg/dl. His dinner blood glucose range is generally >200 mg/dl. His overall blood glucose range is >200 mg/dl. An ACE inhibitor/angiotensin II receptor blocker is being taken. Eye exam is current.  Hyperlipidemia  This is a chronic problem. The current episode started more than 1 year ago. Pertinent negatives include no chest pain, myalgias or shortness of breath. Current antihyperlipidemic treatment includes statins. Risk factors for coronary artery disease include dyslipidemia, diabetes mellitus, hypertension, male sex, obesity and a sedentary lifestyle.  Hypertension  This is a chronic problem. The current episode started more than 1 year ago. Associated symptoms include blurred vision. Pertinent negatives include no chest pain, headaches, neck pain, palpitations or shortness of breath. Risk factors for coronary artery disease include smoking/tobacco exposure, male gender, obesity, dyslipidemia, diabetes mellitus, sedentary lifestyle and family history. Past treatments include ACE inhibitors.     Review of Systems  Constitutional: Negative for chills, fatigue, fever and unexpected weight change.  HENT: Negative for dental problem, mouth sores and trouble swallowing.   Eyes: Positive for blurred vision. Negative for visual disturbance.  Respiratory: Negative for cough, choking, chest tightness, shortness of breath and wheezing.   Cardiovascular: Negative for chest pain, palpitations and leg swelling.  Gastrointestinal: Negative for abdominal distention, abdominal pain, constipation, diarrhea, nausea and vomiting.  Endocrine: Positive for polydipsia and polyuria. Negative for  polyphagia.  Genitourinary: Negative for dysuria, flank pain, hematuria and urgency.  Musculoskeletal: Negative for back pain, gait problem, myalgias and neck pain.  Skin: Negative for pallor, rash and wound.  Neurological: Negative for seizures, syncope, weakness, numbness and headaches.  Psychiatric/Behavioral: Negative.  Negative for confusion and dysphoric mood.    Objective:    BP 100/64   Pulse 68   Ht '5\' 11"'$  (1.803 m)   Wt 246 lb (111.6 kg)   BMI 34.31 kg/m   Wt Readings from Last 3 Encounters:  03/19/16 246 lb (111.6 kg)  03/18/16 246 lb (111.6 kg)  03/11/16 252 lb (114.3 kg)    Physical Exam  Constitutional: He is oriented to person, place, and time. He appears well-developed. He is cooperative. No distress.  HENT:  Head: Normocephalic and atraumatic.  Eyes: EOM are normal.  Neck: Normal range of motion. Neck supple. No tracheal deviation present. No thyromegaly present.  Cardiovascular: Normal rate, S1 normal, S2 normal and normal heart sounds.  Exam reveals no gallop.   No murmur heard. Pulses:      Dorsalis pedis pulses are 1+ on the right side, and 1+ on the left side.       Posterior tibial pulses are 1+ on the right side,  and 1+ on the left side.  Pulmonary/Chest: Breath sounds normal. No respiratory distress. He has no wheezes.  Abdominal: Soft. Bowel sounds are normal. He exhibits no distension. There is no tenderness. There is no guarding and no CVA tenderness.  Musculoskeletal: He exhibits no edema.       Right shoulder: He exhibits no swelling and no deformity.  Neurological: He is alert and oriented to person, place, and time. He has normal strength and normal reflexes. No cranial nerve deficit or sensory deficit. Gait normal.  Skin: Skin is warm and dry. No rash noted. No cyanosis. Nails show no clubbing.  Psychiatric: He has a normal mood and affect. His speech is normal and behavior is normal. Judgment and thought content normal. Cognition and memory are  normal.     CMP ( most recent) CMP     Component Value Date/Time   NA 135 01/25/2010 2350   K 4.0 01/25/2010 2350   CL 97 01/25/2010 2350   CO2 25 01/25/2010 2350   GLUCOSE 238 (H) 01/25/2010 2350   BUN 20 01/25/2010 2350   CREATININE 1.07 01/25/2010 2350   CALCIUM 9.1 01/25/2010 2350   GFRNONAA >60 01/09/2010 1138   GFRAA  01/09/2010 1138    >60        The eGFR has been calculated using the MDRD equation. This calculation has not been validated in all clinical situations. eGFR's persistently <60 mL/min signify possible Chronic Kidney Disease.      Assessment & Plan:   1. Uncontrolled type 2 diabetes mellitus with complication, without long-term current use of insulin ([place])   - Patient has currently uncontrolled symptomatic type 2 DM since  70 years of age,  with most recent A1c of 10.2 %. Recent labs reviewed.   His diabetes is complicated by coronary artery disease requiring stent placement, obesity/sedentary life and patient remains at a high risk for more acute and chronic complications of diabetes which include CAD, CVA, CKD, retinopathy, and neuropathy. These are all discussed in detail with the patient.  - I have counseled the patient on diet management and weight loss, by adopting a carbohydrate restricted/protein rich diet.  - Suggestion is made for patient to avoid simple carbohydrates   from their diet including Cakes , Desserts, Ice Cream,  Soda (  diet and regular) , Sweet Tea , Candies,  Chips, Cookies, Artificial Sweeteners,   and "Sugar-free" Products . This will help patient to have stable blood glucose profile and potentially avoid unintended weight gain.  - I encouraged the patient to switch to  unprocessed or minimally processed complex starch and increased protein intake (animal or plant source), fruits, and vegetables.  - Patient is advised to stick to a routine mealtimes to eat 3 meals  a day and avoid unnecessary snacks ( to snack only to correct  hypoglycemia).  - The patient will be scheduled with Jearld Fenton, RDN, CDE for individualized DM education.  - I have approached patient with the following individualized plan to manage diabetes and patient agrees:   - Given his significant glycemic burden, he will need insulin therapy.  - I discussed with him and his wife and he agrees with plan. I will proceed to initiate basal insulin with Lantus 20 units daily at bedtime associated with monitoring of blood glucose before breakfast and at bedtime.  - I advised him to continue metformin 500 mg by mouth twice a day and Januvia 100 mg by mouth every morning. -Patient is encouraged to  call clinic for blood glucose levels less than 70 or above 300 mg /dl.  - Patient will be considered for  GLP1 therapy as appropriate next visit. - Patient specific target  A1c;  LDL, HDL, Triglycerides, and  Waist Circumference were discussed in detail.  2) BP/HTN: Controlled. Continue current medications including ACEI/ARB. 3) Lipids/HPL:  Control unknown, continue statins. 4)  Weight/Diet: CDE Consult will be initiated , exercise, and detailed carbohydrates information provided.  5) Chronic Care/Health Maintenance:  -Patient is on ACEI/ARB and Statin medications and encouraged to continue to follow up with Ophthalmology, Podiatrist at least yearly or according to recommendations, and advised to  stay away from smoking. I have recommended yearly flu vaccine and pneumonia vaccination at least every 5 years; moderate intensity exercise for up to 150 minutes weekly; and  sleep for at least 7 hours a day.  - 30 minutes of time was spent on the care of this patient , 50% of which was applied for counseling on diabetes complications and their preventions.  - Patient to bring meter and  blood glucose logs during their next visit.   - I advised patient to maintain close follow up with Celedonio Savage, MD for primary care needs.  Follow up plan: - Return in about 7  weeks (around 05/07/2016).  Glade Lloyd, MD Phone: [phone]  Fax: [phone]   03/19/2016, 9:37 AM

## 2016-04-09 ENCOUNTER — Ambulatory Visit: Payer: Medicare Other

## 2016-04-09 DIAGNOSIS — I700 Atherosclerosis of aorta: Secondary | ICD-10-CM | POA: Diagnosis not present

## 2016-04-09 DIAGNOSIS — Z136 Encounter for screening for cardiovascular disorders: Secondary | ICD-10-CM | POA: Diagnosis not present

## 2016-04-09 DIAGNOSIS — Z139 Encounter for screening, unspecified: Secondary | ICD-10-CM

## 2016-05-07 ENCOUNTER — Ambulatory Visit (INDEPENDENT_AMBULATORY_CARE_PROVIDER_SITE_OTHER): Payer: Medicare Other | Admitting: "Endocrinology

## 2016-05-07 ENCOUNTER — Encounter: Payer: Self-pay | Admitting: "Endocrinology

## 2016-05-07 VITALS — BP 109/66 | HR 66 | Ht 71.0 in | Wt 245.0 lb

## 2016-05-07 DIAGNOSIS — E1159 Type 2 diabetes mellitus with other circulatory complications: Secondary | ICD-10-CM

## 2016-05-07 DIAGNOSIS — I10 Essential (primary) hypertension: Secondary | ICD-10-CM | POA: Diagnosis not present

## 2016-05-07 DIAGNOSIS — I2510 Atherosclerotic heart disease of native coronary artery without angina pectoris: Secondary | ICD-10-CM | POA: Diagnosis not present

## 2016-05-07 DIAGNOSIS — E7800 Pure hypercholesterolemia, unspecified: Secondary | ICD-10-CM | POA: Diagnosis not present

## 2016-05-07 MED ORDER — INSULIN GLARGINE 100 UNIT/ML SOLOSTAR PEN: 30.0000 [IU] | PEN_INJECTOR | Freq: Every day | SUBCUTANEOUS | 2 refills | Status: DC

## 2016-05-07 NOTE — Progress Notes (Signed)
Subjective:    Patient ID: Johnny Huber, male    DOB: May 25, 1946. Patient is being seen in f/u  for management of diabetes requested by  Celedonio Savage, MD  Past Medical History:  Diagnosis Date  . Asthmatic bronchitis   . Coronary atherosclerosis of native coronary artery   . DDD (degenerative disc disease), lumbar   . Diabetes ([place])   . Gastroesophageal reflux disease   . Hypertension   . Insomnia, unspecified   . Obstructive sleep apnea   . Prostate cancer ([place])   . Rhinitis, allergic    Past Surgical History:  Procedure Laterality Date  . CARDIAC CATHETERIZATION  2015  . OTHER SURGICAL HISTORY  1983   R KNEE ARTHORSCOPY   Social History   Social History  . Marital status: Married    Spouse name: N/A  . Number of children: N/A  . Years of education: N/A   Social History Main Topics  . Smoking status: Former Smoker    Quit date: 09/01/1982  . Smokeless tobacco: Never Used  . Alcohol use No  . Drug use: No  . Sexual activity: Yes    Partners: Female   Other Topics Concern  . None   Social History Narrative  . None   Outpatient Encounter Prescriptions as of 05/07/2016  Medication Sig  . albuterol (PROVENTIL HFA;VENTOLIN HFA) 108 (90 Base) MCG/ACT inhaler Inhale into the lungs every 6 (six) hours as needed for wheezing or shortness of breath.  Marland Kitchen amLODipine (NORVASC) 10 MG tablet Take 10 mg by mouth daily.  Marland Kitchen aspirin EC 81 MG tablet Take 81 mg by mouth daily.  Marland Kitchen atorvastatin (LIPITOR) 40 MG tablet Take 1 tablet (40 mg total) by mouth daily.  . cetirizine (ZYRTEC) 10 MG tablet Take 10 mg by mouth daily.  . Cholecalciferol (VITAMIN D3) 1000 units CAPS Take by mouth daily.  . citalopram (CELEXA) 20 MG tablet Take 20 mg by mouth daily.  Marland Kitchen gabapentin (NEURONTIN) 300 MG capsule Take 300 mg by mouth 3 (three) times daily.   Marland Kitchen glucose monitoring kit (FREESTYLE) monitoring kit 1 each by Does not apply route as needed for other.  . Insulin Glargine (LANTUS SOLOSTAR)  100 UNIT/ML Solostar Pen Inject 20 Units into the skin daily at 10 pm.  . Insulin Pen Needle (B-D ULTRAFINE III SHORT PEN) 31G X 8 MM MISC 1 each by Does not apply route as directed.  . mometasone (NASONEX) 50 MCG/ACT nasal spray Place 2 sprays into the nose daily.  . nitroGLYCERIN (NITROSTAT) 0.4 MG SL tablet Place 0.4 mg under the tongue every 5 (five) minutes as needed for chest pain.  . sitaGLIPtin (JANUVIA) 100 MG tablet Take 100 mg by mouth daily.  . traMADol (ULTRAM) 50 MG tablet Take 50 mg by mouth every 6 (six) hours as needed.  . valsartan-hydrochlorothiazide (DIOVAN-HCT) 160-12.5 MG tablet Take 1 tablet by mouth daily.  . [DISCONTINUED] metFORMIN (GLUCOPHAGE) 500 MG tablet Take 500 mg by mouth 2 (two) times daily with a meal.   No facility-administered encounter medications on file as of 05/07/2016.    ALLERGIES: No Known Allergies VACCINATION STATUS:  There is no immunization history on file for this patient.  Diabetes  He presents for his follow-up diabetic visit. He has type 2 diabetes mellitus. Onset time: He was diagnosed at approximate age of 70 years. His disease course has been worsening. There are no hypoglycemic associated symptoms. Pertinent negatives for hypoglycemia include no confusion, headaches, pallor or seizures.  Associated symptoms include blurred vision, polydipsia and polyuria. Pertinent negatives for diabetes include no chest pain, no fatigue, no polyphagia and no weakness. There are no hypoglycemic complications. Symptoms are worsening. Risk factors for coronary artery disease include diabetes mellitus, dyslipidemia, hypertension, family history, male sex, obesity, sedentary lifestyle and tobacco exposure. Current diabetic treatment includes oral agent (triple therapy) (He is on Januvia 100 mg by mouth daily, metformin 500 mg by mouth twice a day, glimepiride 4 mg by mouth daily). His weight is decreasing steadily. He is following a generally unhealthy diet. When  asked about meal planning, he reported none. He has not had a previous visit with a dietitian. He rarely participates in exercise. Home blood sugar record trend: His average blood glucose for the last 7 days is 216 of 24 readings. His breakfast blood glucose range is generally >200 mg/dl. His lunch blood glucose range is generally >200 mg/dl. His dinner blood glucose range is generally >200 mg/dl. His overall blood glucose range is >200 mg/dl. An ACE inhibitor/angiotensin II receptor blocker is being taken. Eye exam is current.  Hyperlipidemia  This is a chronic problem. The current episode started more than 1 year ago. Pertinent negatives include no chest pain, myalgias or shortness of breath. Current antihyperlipidemic treatment includes statins. Risk factors for coronary artery disease include dyslipidemia, diabetes mellitus, hypertension, male sex, obesity and a sedentary lifestyle.  Hypertension  This is a chronic problem. The current episode started more than 1 year ago. Associated symptoms include blurred vision. Pertinent negatives include no chest pain, headaches, neck pain, palpitations or shortness of breath. Risk factors for coronary artery disease include smoking/tobacco exposure, male gender, obesity, dyslipidemia, diabetes mellitus, sedentary lifestyle and family history. Past treatments include ACE inhibitors.     Review of Systems  Constitutional: Negative for chills, fatigue, fever and unexpected weight change.  HENT: Negative for dental problem, mouth sores and trouble swallowing.   Eyes: Positive for blurred vision. Negative for visual disturbance.  Respiratory: Negative for cough, choking, chest tightness, shortness of breath and wheezing.   Cardiovascular: Negative for chest pain, palpitations and leg swelling.  Gastrointestinal: Negative for abdominal distention, abdominal pain, constipation, diarrhea, nausea and vomiting.  Endocrine: Positive for polydipsia and polyuria.  Negative for polyphagia.  Genitourinary: Negative for dysuria, flank pain, hematuria and urgency.  Musculoskeletal: Negative for back pain, gait problem, myalgias and neck pain.  Skin: Negative for pallor, rash and wound.  Neurological: Negative for seizures, syncope, weakness, numbness and headaches.  Psychiatric/Behavioral: Negative.  Negative for confusion and dysphoric mood.    Objective:    BP 109/66   Pulse 66   Ht '5\' 11"'$  (1.803 m)   Wt 245 lb (111.1 kg)   BMI 34.17 kg/m   Wt Readings from Last 3 Encounters:  05/07/16 245 lb (111.1 kg)  03/19/16 246 lb (111.6 kg)  03/18/16 246 lb (111.6 kg)    Physical Exam  Constitutional: He is oriented to person, place, and time. He appears well-developed. He is cooperative. No distress.  HENT:  Head: Normocephalic and atraumatic.  Eyes: EOM are normal.  Neck: Normal range of motion. Neck supple. No tracheal deviation present. No thyromegaly present.  Cardiovascular: Normal rate, S1 normal, S2 normal and normal heart sounds.  Exam reveals no gallop.   No murmur heard. Pulses:      Dorsalis pedis pulses are 1+ on the right side, and 1+ on the left side.       Posterior tibial pulses are 1+ on the  right side, and 1+ on the left side.  Pulmonary/Chest: Breath sounds normal. No respiratory distress. He has no wheezes.  Abdominal: Soft. Bowel sounds are normal. He exhibits no distension. There is no tenderness. There is no guarding and no CVA tenderness.  Musculoskeletal: He exhibits no edema.       Right shoulder: He exhibits no swelling and no deformity.  Neurological: He is alert and oriented to person, place, and time. He has normal strength and normal reflexes. No cranial nerve deficit or sensory deficit. Gait normal.  Skin: Skin is warm and dry. No rash noted. No cyanosis. Nails show no clubbing.  Psychiatric: He has a normal mood and affect. His speech is normal and behavior is normal. Judgment and thought content normal. Cognition  and memory are normal.     CMP ( most recent) CMP     Component Value Date/Time   NA 135 01/25/2010 2350   K 4.0 01/25/2010 2350   CL 97 01/25/2010 2350   CO2 25 01/25/2010 2350   GLUCOSE 238 (H) 01/25/2010 2350   BUN 20 01/25/2010 2350   CREATININE 1.07 01/25/2010 2350   CALCIUM 9.1 01/25/2010 2350   GFRNONAA >60 01/09/2010 1138   GFRAA  01/09/2010 1138    >60        The eGFR has been calculated using the MDRD equation. This calculation has not been validated in all clinical situations. eGFR's persistently <60 mL/min signify possible Chronic Kidney Disease.      Assessment & Plan:   1. Uncontrolled type 2 diabetes mellitus with complication, without long-term current use of insulin ([place])   - Patient has currently uncontrolled symptomatic type 2 DM since  70 years of age.  - His A1c prior to his last visit was 10.2%, he was supposed to do lab tests before this visit however he missed his blood work appointment.  His diabetes is complicated by coronary artery disease requiring stent placement, obesity/sedentary life and patient remains at a high risk for more acute and chronic complications of diabetes which include CAD, CVA, CKD, retinopathy, and neuropathy. These are all discussed in detail with the patient.  - I have counseled the patient on diet management and weight loss, by adopting a carbohydrate restricted/protein rich diet.  - Suggestion is made for patient to avoid simple carbohydrates   from their diet including Cakes , Desserts, Ice Cream,  Soda (  diet and regular) , Sweet Tea , Candies,  Chips, Cookies, Artificial Sweeteners,   and "Sugar-free" Products . This will help patient to have stable blood glucose profile and potentially avoid unintended weight gain.  - I encouraged the patient to switch to  unprocessed or minimally processed complex starch and increased protein intake (animal or plant source), fruits, and vegetables.  - Patient is advised to stick  to a routine mealtimes to eat 3 meals  a day and avoid unnecessary snacks ( to snack only to correct hypoglycemia).  - The patient will be scheduled with Jearld Fenton, RDN, CDE for individualized DM education.  - I have approached patient with the following individualized plan to manage diabetes and patient agrees:   - Given his significant glycemic burden, he will  Still need insulin therapy.  - I will proceed to increase basal insulin with Lantus to 30 units daily at bedtime associated with monitoring of blood glucose before breakfast and at bedtime.  - He misread My last instructions and did not pick up  metformin. He would still benefit  from metformin therapy. I advised him to continue metformin 500 mg by mouth twice a day and Januvia 100 mg by mouth every morning. -Patient is encouraged to call clinic for blood glucose levels less than 70 or above 300 mg /dl.  - Patient specific target  A1c;  LDL, HDL, Triglycerides, and  Waist Circumference were discussed in detail.  2) BP/HTN: Controlled. Continue current medications including ACEI/ARB. 3) Lipids/HPL:  Control unknown, continue statins. 4)  Weight/Diet: CDE Consult will be initiated , exercise, and detailed carbohydrates information provided.  5) Chronic Care/Health Maintenance:  -Patient is on ACEI/ARB and Statin medications and encouraged to continue to follow up with Ophthalmology, Podiatrist at least yearly or according to recommendations, and advised to  stay away from smoking. I have recommended yearly flu vaccine and pneumonia vaccination at least every 5 years; moderate intensity exercise for up to 150 minutes weekly; and  sleep for at least 7 hours a day.  - 30 minutes of time was spent on the care of this patient , 50% of which was applied for counseling on diabetes complications and their preventions.  He would have fasting labwork this week and will return in 2 weeks with his meter and logs. - I advised patient to  maintain close follow up with Celedonio Savage, MD for primary care needs.  Follow up plan: - Return in about 2 weeks (around 05/21/2016) for meter, and logs.  Glade Lloyd, MD Phone: [phone]  Fax: [phone]   05/07/2016, 2:19 PM

## 2016-05-07 NOTE — Patient Instructions (Signed)

## 2016-05-27 ENCOUNTER — Ambulatory Visit: Payer: Medicare Other | Admitting: "Endocrinology

## 2016-06-10 ENCOUNTER — Other Ambulatory Visit: Payer: Self-pay | Admitting: "Endocrinology

## 2016-06-10 LAB — COMPREHENSIVE METABOLIC PANEL
ALBUMIN: 4.1 g/dL (ref 3.6–5.1)
ALT: 32 U/L (ref 9–46)
AST: 37 U/L — ABNORMAL HIGH (ref 10–35)
Alkaline Phosphatase: 91 U/L (ref 40–115)
BILIRUBIN TOTAL: 0.6 mg/dL (ref 0.2–1.2)
BUN: 11 mg/dL (ref 7–25)
CHLORIDE: 101 mmol/L (ref 98–110)
CO2: 27 mmol/L (ref 20–31)
CREATININE: 1.28 mg/dL — AB (ref 0.70–1.18)
Calcium: 9.5 mg/dL (ref 8.6–10.3)
Glucose, Bld: 173 mg/dL — ABNORMAL HIGH (ref 65–99)
Potassium: 4.2 mmol/L (ref 3.5–5.3)
SODIUM: 139 mmol/L (ref 135–146)
TOTAL PROTEIN: 7 g/dL (ref 6.1–8.1)

## 2016-06-10 LAB — LIPID PANEL
Cholesterol: 147 mg/dL (ref <200)
HDL: 30 mg/dL — ABNORMAL LOW (ref >40)
LDL Cholesterol: 91 mg/dL (ref <100)
Total CHOL/HDL Ratio: 4.9 Ratio (ref <5.0)
Triglycerides: 132 mg/dL (ref <150)
VLDL: 26 mg/dL (ref <30)

## 2016-06-11 LAB — MICROALBUMIN / CREATININE URINE RATIO
Creatinine, Urine: 204 mg/dL (ref 20–370)
Microalb Creat Ratio: 7 mcg/mg creat (ref <30)
Microalb, Ur: 1.4 mg/dL

## 2016-06-11 LAB — HEMOGLOBIN A1C
HEMOGLOBIN A1C: 9.2 % — AB (ref <5.7)
MEAN PLASMA GLUCOSE: 217 mg/dL

## 2016-06-16 ENCOUNTER — Encounter: Payer: Self-pay | Admitting: "Endocrinology

## 2016-06-16 ENCOUNTER — Ambulatory Visit (INDEPENDENT_AMBULATORY_CARE_PROVIDER_SITE_OTHER): Payer: Medicare Other | Admitting: "Endocrinology

## 2016-06-16 VITALS — BP 136/74 | HR 71 | Ht 71.0 in | Wt 249.0 lb

## 2016-06-16 DIAGNOSIS — I10 Essential (primary) hypertension: Secondary | ICD-10-CM | POA: Diagnosis not present

## 2016-06-16 DIAGNOSIS — E1159 Type 2 diabetes mellitus with other circulatory complications: Secondary | ICD-10-CM

## 2016-06-16 DIAGNOSIS — E7800 Pure hypercholesterolemia, unspecified: Secondary | ICD-10-CM

## 2016-06-16 MED ORDER — INSULIN GLARGINE 100 UNIT/ML SOLOSTAR PEN: 20.0000 [IU] | PEN_INJECTOR | Freq: Every day | SUBCUTANEOUS | 2 refills | Status: DC

## 2016-06-16 NOTE — Progress Notes (Signed)
Subjective:    Patient ID: Johnny Huber, male    DOB: 1946-01-07. Patient is being seen in f/u  for management of diabetes requested by  Celedonio Savage, MD  Past Medical History:  Diagnosis Date  . Asthmatic bronchitis   . Coronary atherosclerosis of native coronary artery   . DDD (degenerative disc disease), lumbar   . Diabetes ([place])   . Gastroesophageal reflux disease   . Hypertension   . Insomnia, unspecified   . Obstructive sleep apnea   . Prostate cancer ([place])   . Rhinitis, allergic    Past Surgical History:  Procedure Laterality Date  . CARDIAC CATHETERIZATION  2015  . OTHER SURGICAL HISTORY  1983   R KNEE ARTHORSCOPY   Social History   Social History  . Marital status: Married    Spouse name: N/A  . Number of children: N/A  . Years of education: N/A   Social History Main Topics  . Smoking status: Former Smoker    Quit date: 09/01/1982  . Smokeless tobacco: Never Used  . Alcohol use No  . Drug use: No  . Sexual activity: Yes    Partners: Female   Other Topics Concern  . None   Social History Narrative  . None   Outpatient Encounter Prescriptions as of 06/16/2016  Medication Sig  . metFORMIN (GLUCOPHAGE) 500 MG tablet Take 500 mg by mouth 2 (two) times daily with a meal.  . albuterol (PROVENTIL HFA;VENTOLIN HFA) 108 (90 Base) MCG/ACT inhaler Inhale into the lungs every 6 (six) hours as needed for wheezing or shortness of breath.  Marland Kitchen amLODipine (NORVASC) 10 MG tablet Take 10 mg by mouth daily.  Marland Kitchen aspirin EC 81 MG tablet Take 81 mg by mouth daily.  Marland Kitchen atorvastatin (LIPITOR) 40 MG tablet Take 1 tablet (40 mg total) by mouth daily.  . cetirizine (ZYRTEC) 10 MG tablet Take 10 mg by mouth daily.  . Cholecalciferol (VITAMIN D3) 1000 units CAPS Take by mouth daily.  . citalopram (CELEXA) 20 MG tablet Take 20 mg by mouth daily.  Marland Kitchen gabapentin (NEURONTIN) 300 MG capsule Take 300 mg by mouth 3 (three) times daily.   Marland Kitchen glucose monitoring kit (FREESTYLE) monitoring  kit 1 each by Does not apply route as needed for other.  . Insulin Glargine (LANTUS SOLOSTAR) 100 UNIT/ML Solostar Pen Inject 20 Units into the skin daily at 10 pm.  . Insulin Pen Needle (B-D ULTRAFINE III SHORT PEN) 31G X 8 MM MISC 1 each by Does not apply route as directed.  . mometasone (NASONEX) 50 MCG/ACT nasal spray Place 2 sprays into the nose daily.  . nitroGLYCERIN (NITROSTAT) 0.4 MG SL tablet Place 0.4 mg under the tongue every 5 (five) minutes as needed for chest pain.  . traMADol (ULTRAM) 50 MG tablet Take 50 mg by mouth every 6 (six) hours as needed.  . valsartan-hydrochlorothiazide (DIOVAN-HCT) 160-12.5 MG tablet Take 1 tablet by mouth daily.  . [DISCONTINUED] Insulin Glargine (LANTUS SOLOSTAR) 100 UNIT/ML Solostar Pen Inject 30 Units into the skin daily at 10 pm. (Patient taking differently: Inject 25 Units into the skin daily at 10 pm. )  . [DISCONTINUED] sitaGLIPtin (JANUVIA) 100 MG tablet Take 100 mg by mouth daily.   No facility-administered encounter medications on file as of 06/16/2016.    ALLERGIES: No Known Allergies VACCINATION STATUS:  There is no immunization history on file for this patient.  Diabetes  He presents for his follow-up diabetic visit. He has type 2 diabetes  mellitus. Onset time: He was diagnosed at approximate age of 59 years. His disease course has been improving. There are no hypoglycemic associated symptoms. Pertinent negatives for hypoglycemia include no confusion, headaches, pallor or seizures. Associated symptoms include blurred vision. Pertinent negatives for diabetes include no chest pain, no fatigue, no polydipsia, no polyphagia, no polyuria and no weakness. There are no hypoglycemic complications. Symptoms are improving. Risk factors for coronary artery disease include diabetes mellitus, dyslipidemia, hypertension, family history, male sex, obesity, sedentary lifestyle and tobacco exposure. Current diabetic treatment includes oral agent (triple  therapy) (He is on Januvia 100 mg by mouth daily, metformin 500 mg by mouth twice a day, glimepiride 4 mg by mouth daily). His weight is increasing steadily. He is following a generally unhealthy diet. When asked about meal planning, he reported none. He has not had a previous visit with a dietitian. He rarely participates in exercise. Home blood sugar record trend: His average blood glucose for the last 7 days is 216 of 24 readings. His breakfast blood glucose range is generally 180-200 mg/dl. His dinner blood glucose range is generally 180-200 mg/dl. His overall blood glucose range is 180-200 mg/dl. An ACE inhibitor/angiotensin II receptor blocker is being taken. Eye exam is current.  Hyperlipidemia  This is a chronic problem. The current episode started more than 1 year ago. Pertinent negatives include no chest pain, myalgias or shortness of breath. Current antihyperlipidemic treatment includes statins. Risk factors for coronary artery disease include dyslipidemia, diabetes mellitus, hypertension, male sex, obesity and a sedentary lifestyle.  Hypertension  This is a chronic problem. The current episode started more than 1 year ago. Associated symptoms include blurred vision. Pertinent negatives include no chest pain, headaches, neck pain, palpitations or shortness of breath. Risk factors for coronary artery disease include smoking/tobacco exposure, male gender, obesity, dyslipidemia, diabetes mellitus, sedentary lifestyle and family history. Past treatments include ACE inhibitors.     Review of Systems  Constitutional: Negative for chills, fatigue, fever and unexpected weight change.  HENT: Negative for dental problem, mouth sores and trouble swallowing.   Eyes: Positive for blurred vision. Negative for visual disturbance.  Respiratory: Negative for cough, choking, chest tightness, shortness of breath and wheezing.   Cardiovascular: Negative for chest pain, palpitations and leg swelling.   Gastrointestinal: Negative for abdominal distention, abdominal pain, constipation, diarrhea, nausea and vomiting.  Endocrine: Negative for polydipsia, polyphagia and polyuria.  Genitourinary: Negative for dysuria, flank pain, hematuria and urgency.  Musculoskeletal: Negative for back pain, gait problem, myalgias and neck pain.  Skin: Negative for pallor, rash and wound.  Neurological: Negative for seizures, syncope, weakness, numbness and headaches.  Psychiatric/Behavioral: Negative.  Negative for confusion and dysphoric mood.    Objective:    BP 136/74   Pulse 71   Ht 5' 11" (1.803 m)   Wt 249 lb (112.9 kg)   BMI 34.73 kg/m   Wt Readings from Last 3 Encounters:  06/16/16 249 lb (112.9 kg)  05/07/16 245 lb (111.1 kg)  03/19/16 246 lb (111.6 kg)    Physical Exam  Constitutional: He is oriented to person, place, and time. He appears well-developed. He is cooperative. No distress.  HENT:  Head: Normocephalic and atraumatic.  Eyes: EOM are normal.  Neck: Normal range of motion. Neck supple. No tracheal deviation present. No thyromegaly present.  Cardiovascular: Normal rate, S1 normal, S2 normal and normal heart sounds.  Exam reveals no gallop.   No murmur heard. Pulses:      Dorsalis pedis pulses  are 1+ on the right side, and 1+ on the left side.       Posterior tibial pulses are 1+ on the right side, and 1+ on the left side.  Pulmonary/Chest: Breath sounds normal. No respiratory distress. He has no wheezes.  Abdominal: Soft. Bowel sounds are normal. He exhibits no distension. There is no tenderness. There is no guarding and no CVA tenderness.  Musculoskeletal: He exhibits no edema.       Right shoulder: He exhibits no swelling and no deformity.  Neurological: He is alert and oriented to person, place, and time. He has normal strength and normal reflexes. No cranial nerve deficit or sensory deficit. Gait normal.  Skin: Skin is warm and dry. No rash noted. No cyanosis. Nails show  no clubbing.  Psychiatric: He has a normal mood and affect. His speech is normal and behavior is normal. Judgment and thought content normal. Cognition and memory are normal.    Recent Results (from the past 2160 hour(s))  Microalbumin / creatinine urine ratio     Status: None   Collection Time: 06/10/16  7:42 AM  Result Value Ref Range   Creatinine, Urine 204 20 - 370 mg/dL   Microalb, Ur 1.4 Not estab mg/dL   Microalb Creat Ratio 7 <30 mcg/mg creat    Comment: The ADA has defined abnormalities in albumin excretion as follows:           Category           Result                            (mcg/mg creatinine)                 Normal:    <30       Microalbuminuria:    30 - 299   Clinical albuminuria:    > or = 300   The ADA recommends that at least two of three specimens collected within a 3 - 6 month period be abnormal before considering a patient to be within a diagnostic category.     Comprehensive metabolic panel     Status: Abnormal   Collection Time: 06/10/16  7:42 AM  Result Value Ref Range   Sodium 139 135 - 146 mmol/L   Potassium 4.2 3.5 - 5.3 mmol/L   Chloride 101 98 - 110 mmol/L   CO2 27 20 - 31 mmol/L   Glucose, Bld 173 (H) 65 - 99 mg/dL   BUN 11 7 - 25 mg/dL   Creat 1.28 (H) 0.70 - 1.18 mg/dL    Comment:   For patients > or = 71 years of age: The upper reference limit for Creatinine is approximately 13% higher for people identified as African-American.      Total Bilirubin 0.6 0.2 - 1.2 mg/dL   Alkaline Phosphatase 91 40 - 115 U/L   AST 37 (H) 10 - 35 U/L   ALT 32 9 - 46 U/L   Total Protein 7.0 6.1 - 8.1 g/dL   Albumin 4.1 3.6 - 5.1 g/dL   Calcium 9.5 8.6 - 10.3 mg/dL  Lipid panel     Status: Abnormal   Collection Time: 06/10/16  7:42 AM  Result Value Ref Range   Cholesterol 147 <200 mg/dL   Triglycerides 132 <150 mg/dL   HDL 30 (L) >40 mg/dL   Total CHOL/HDL Ratio 4.9 <5.0 Ratio   VLDL 26 <30 mg/dL   LDL  Cholesterol 91 <100 mg/dL  Hemoglobin A1c      Status: Abnormal   Collection Time: 06/10/16  7:42 AM  Result Value Ref Range   Hgb A1c MFr Bld 9.2 (H) <5.7 %    Comment:   For someone without known diabetes, a hemoglobin A1c value of 6.5% or greater indicates that they may have diabetes and this should be confirmed with a follow-up test.   For someone with known diabetes, a value <7% indicates that their diabetes is well controlled and a value greater than or equal to 7% indicates suboptimal control. A1c targets should be individualized based on duration of diabetes, age, comorbid conditions, and other considerations.   Currently, no consensus exists for use of hemoglobin A1c for diagnosis of diabetes for children.      Mean Plasma Glucose 217 mg/dL     Assessment & Plan:   1. Uncontrolled type 2 diabetes mellitus with complication, without long-term current use of insulin ([place])   - Patient has currently uncontrolled symptomatic type 2 DM since  71 years of age. - He continued to improve and tolerated his insulin and metformin.  - His A1c Is improved to 9.2% from 10.2%.   His diabetes is complicated by coronary artery disease requiring stent placement, obesity/sedentary life and patient remains at a high risk for more acute and chronic complications of diabetes which include CAD, CVA, CKD, retinopathy, and neuropathy. These are all discussed in detail with the patient.  - I have counseled the patient on diet management and weight loss, by adopting a carbohydrate restricted/protein rich diet.  - Suggestion is made for patient to avoid simple carbohydrates   from their diet including Cakes , Desserts, Ice Cream,  Soda (  diet and regular) , Sweet Tea , Candies,  Chips, Cookies, Artificial Sweeteners,   and "Sugar-free" Products . This will help patient to have stable blood glucose profile and potentially avoid unintended weight gain.  - I encouraged the patient to switch to  unprocessed or minimally processed complex starch  and increased protein intake (animal or plant source), fruits, and vegetables.  - Patient is advised to stick to a routine mealtimes to eat 3 meals  a day and avoid unnecessary snacks ( to snack only to correct hypoglycemia).  - The patient will be scheduled with Jearld Fenton, RDN, CDE for individualized DM education.  - I have approached patient with the following individualized plan to manage diabetes and patient agrees:     - I will proceed to decrease basal insulin with Lantus to 20 units daily at bedtime associated with monitoring of blood glucose before breakfast and at bedtime.  - I advised him to continue metformin 500 mg by mouth twice a day and Januvia 100 mg by mouth every morning- as long as he has it. -Patient is encouraged to call clinic for blood glucose levels less than 70 or above 300 mg /dl.  - Patient specific target  A1c;  LDL, HDL, Triglycerides, and  Waist Circumference were discussed in detail.  2) BP/HTN: Controlled. Continue current medications including ACEI/ARB. 3) Lipids/HPL:  Control unknown, continue statins. 4)  Weight/Diet: CDE Consult will be initiated , exercise, and detailed carbohydrates information provided.  5) Chronic Care/Health Maintenance:  -Patient is on ACEI/ARB and Statin medications and encouraged to continue to follow up with Ophthalmology, Podiatrist at least yearly or according to recommendations, and advised to  stay away from smoking. I have recommended yearly flu vaccine and pneumonia vaccination at least  every 5 years; moderate intensity exercise for up to 150 minutes weekly; and  sleep for at least 7 hours a day.  - 30 minutes of time was spent on the care of this patient , 50% of which was applied for counseling on diabetes complications and their preventions.  He would have fasting labwork this week and will return in 2 weeks with his meter and logs. - I advised patient to maintain close follow up with Celedonio Savage, MD for primary  care needs.  Follow up plan: - Return in about 3 months (around 09/14/2016) for meter, and logs.  Glade Lloyd, MD Phone: [phone]  Fax: [phone]   06/16/2016, 10:16 AM

## 2016-06-16 NOTE — Patient Instructions (Signed)

## 2016-06-19 ENCOUNTER — Telehealth: Payer: Self-pay | Admitting: "Endocrinology

## 2016-06-19 NOTE — Telephone Encounter (Signed)
Correct dosage given to pt

## 2016-06-19 NOTE — Telephone Encounter (Signed)
Patient's wife left VM asking for nurse to please call her to help her clarify his Metformin dosage.

## 2016-09-15 ENCOUNTER — Ambulatory Visit: Payer: Medicare Other | Admitting: "Endocrinology

## 2016-09-23 ENCOUNTER — Other Ambulatory Visit: Payer: Self-pay | Admitting: "Endocrinology

## 2016-09-23 LAB — COMPREHENSIVE METABOLIC PANEL
ALT: 19 U/L (ref 9–46)
AST: 26 U/L (ref 10–35)
Albumin: 3.9 g/dL (ref 3.6–5.1)
Alkaline Phosphatase: 79 U/L (ref 40–115)
BUN: 19 mg/dL (ref 7–25)
CHLORIDE: 101 mmol/L (ref 98–110)
CO2: 27 mmol/L (ref 20–31)
CREATININE: 1.23 mg/dL — AB (ref 0.70–1.18)
Calcium: 9 mg/dL (ref 8.6–10.3)
Glucose, Bld: 161 mg/dL — ABNORMAL HIGH (ref 65–99)
Potassium: 4.1 mmol/L (ref 3.5–5.3)
SODIUM: 138 mmol/L (ref 135–146)
Total Bilirubin: 0.9 mg/dL (ref 0.2–1.2)
Total Protein: 6.7 g/dL (ref 6.1–8.1)

## 2016-09-24 ENCOUNTER — Ambulatory Visit: Payer: Medicare Other | Admitting: "Endocrinology

## 2016-09-24 LAB — HEMOGLOBIN A1C
HEMOGLOBIN A1C: 8.3 % — AB (ref <5.7)
Mean Plasma Glucose: 192 mg/dL

## 2016-09-30 ENCOUNTER — Encounter: Payer: Self-pay | Admitting: "Endocrinology

## 2016-09-30 ENCOUNTER — Ambulatory Visit (INDEPENDENT_AMBULATORY_CARE_PROVIDER_SITE_OTHER): Payer: Medicare Other | Admitting: "Endocrinology

## 2016-09-30 VITALS — BP 124/67 | HR 64 | Ht 71.0 in | Wt 245.0 lb

## 2016-09-30 DIAGNOSIS — E7800 Pure hypercholesterolemia, unspecified: Secondary | ICD-10-CM | POA: Diagnosis not present

## 2016-09-30 DIAGNOSIS — E1159 Type 2 diabetes mellitus with other circulatory complications: Secondary | ICD-10-CM

## 2016-09-30 DIAGNOSIS — E6609 Other obesity due to excess calories: Secondary | ICD-10-CM

## 2016-09-30 DIAGNOSIS — I10 Essential (primary) hypertension: Secondary | ICD-10-CM | POA: Diagnosis not present

## 2016-09-30 DIAGNOSIS — Z6834 Body mass index (BMI) 34.0-34.9, adult: Secondary | ICD-10-CM | POA: Diagnosis not present

## 2016-09-30 NOTE — Progress Notes (Signed)
Subjective:    Patient ID: Johnny Huber, male    DOB: [birth year]. Patient is being seen in f/u  for management of diabetes requested by  Celedonio Savage, MD  Past Medical History:  Diagnosis Date  . Asthmatic bronchitis   . Coronary atherosclerosis of native coronary artery   . DDD (degenerative disc disease), lumbar   . Diabetes ([place])   . Gastroesophageal reflux disease   . Hypertension   . Insomnia, unspecified   . Obstructive sleep apnea   . Prostate cancer ([place])   . Rhinitis, allergic    Past Surgical History:  Procedure Laterality Date  . CARDIAC CATHETERIZATION  2015  . OTHER SURGICAL HISTORY  1983   R KNEE ARTHORSCOPY   Social History   Social History  . Marital status: Married    Spouse name: N/A  . Number of children: N/A  . Years of education: N/A   Social History Main Topics  . Smoking status: Former Smoker    Quit date: 09/01/1982  . Smokeless tobacco: Never Used  . Alcohol use No  . Drug use: No  . Sexual activity: Yes    Partners: Female   Other Topics Concern  . None   Social History Narrative  . None   Outpatient Encounter Prescriptions as of 09/30/2016  Medication Sig  . sitaGLIPtin (JANUVIA) 100 MG tablet Take 100 mg by mouth daily.  Marland Kitchen albuterol (PROVENTIL HFA;VENTOLIN HFA) 108 (90 Base) MCG/ACT inhaler Inhale into the lungs every 6 (six) hours as needed for wheezing or shortness of breath.  Marland Kitchen amLODipine (NORVASC) 10 MG tablet Take 10 mg by mouth daily.  Marland Kitchen aspirin EC 81 MG tablet Take 81 mg by mouth daily.  Marland Kitchen atorvastatin (LIPITOR) 40 MG tablet Take 1 tablet (40 mg total) by mouth daily.  . cetirizine (ZYRTEC) 10 MG tablet Take 10 mg by mouth daily.  . Cholecalciferol (VITAMIN D3) 1000 units CAPS Take by mouth daily.  . citalopram (CELEXA) 20 MG tablet Take 20 mg by mouth daily.  Marland Kitchen gabapentin (NEURONTIN) 300 MG capsule Take 300 mg by mouth 3 (three) times daily.   Marland Kitchen glucose monitoring kit (FREESTYLE) monitoring kit 1 each by Does not apply  route as needed for other.  . Insulin Pen Needle (B-D ULTRAFINE III SHORT PEN) 31G X 8 MM MISC 1 each by Does not apply route as directed.  . metFORMIN (GLUCOPHAGE) 500 MG tablet Take 500 mg by mouth 2 (two) times daily with a meal.  . mometasone (NASONEX) 50 MCG/ACT nasal spray Place 2 sprays into the nose daily.  . nitroGLYCERIN (NITROSTAT) 0.4 MG SL tablet Place 0.4 mg under the tongue every 5 (five) minutes as needed for chest pain.  . traMADol (ULTRAM) 50 MG tablet Take 50 mg by mouth every 6 (six) hours as needed.  . valsartan-hydrochlorothiazide (DIOVAN-HCT) 160-12.5 MG tablet Take 1 tablet by mouth daily.  . [DISCONTINUED] Insulin Glargine (LANTUS SOLOSTAR) 100 UNIT/ML Solostar Pen Inject 20 Units into the skin daily at 10 pm. (Patient not taking: Reported on 09/30/2016)   No facility-administered encounter medications on file as of 09/30/2016.    ALLERGIES: No Known Allergies VACCINATION STATUS:  There is no immunization history on file for this patient.  Diabetes  He presents for his follow-up diabetic visit. He has type 2 diabetes mellitus. Onset time: He was diagnosed at approximate age of 45 years. His disease course has been improving. There are no hypoglycemic associated symptoms. Pertinent negatives for hypoglycemia include  no confusion, headaches, pallor or seizures. Associated symptoms include blurred vision. Pertinent negatives for diabetes include no chest pain, no fatigue, no polydipsia, no polyphagia, no polyuria and no weakness. There are no hypoglycemic complications. Symptoms are improving. Risk factors for coronary artery disease include diabetes mellitus, dyslipidemia, hypertension, family history, male sex, obesity, sedentary lifestyle and tobacco exposure. Current diabetic treatment includes oral agent (triple therapy) (He is on Januvia 100 mg by mouth daily, metformin 500 mg by mouth twice a day, glimepiride 4 mg by mouth daily). His weight is stable. He is following a  generally unhealthy diet. When asked about meal planning, he reported none. He has not had a previous visit with a dietitian. He rarely participates in exercise. Home blood sugar record trend: His average blood glucose for the last 7 days is 216 of 24 readings. His breakfast blood glucose range is generally 140-180 mg/dl. His dinner blood glucose range is generally 180-200 mg/dl. His overall blood glucose range is 180-200 mg/dl. An ACE inhibitor/angiotensin II receptor blocker is being taken. Eye exam is current.  Hyperlipidemia  This is a chronic problem. The current episode started more than 1 year ago. Pertinent negatives include no chest pain, myalgias or shortness of breath. Current antihyperlipidemic treatment includes statins. Risk factors for coronary artery disease include dyslipidemia, diabetes mellitus, hypertension, male sex, obesity and a sedentary lifestyle.  Hypertension  This is a chronic problem. The current episode started more than 1 year ago. Associated symptoms include blurred vision. Pertinent negatives include no chest pain, headaches, neck pain, palpitations or shortness of breath. Risk factors for coronary artery disease include smoking/tobacco exposure, male gender, obesity, dyslipidemia, diabetes mellitus, sedentary lifestyle and family history. Past treatments include ACE inhibitors.     Review of Systems  Constitutional: Negative for chills, fatigue, fever and unexpected weight change.  HENT: Negative for dental problem, mouth sores and trouble swallowing.   Eyes: Positive for blurred vision. Negative for visual disturbance.  Respiratory: Negative for cough, choking, chest tightness, shortness of breath and wheezing.   Cardiovascular: Negative for chest pain, palpitations and leg swelling.  Gastrointestinal: Negative for abdominal distention, abdominal pain, constipation, diarrhea, nausea and vomiting.  Endocrine: Negative for polydipsia, polyphagia and polyuria.   Genitourinary: Negative for dysuria, flank pain, hematuria and urgency.  Musculoskeletal: Negative for back pain, gait problem, myalgias and neck pain.  Skin: Negative for pallor, rash and wound.  Neurological: Negative for seizures, syncope, weakness, numbness and headaches.  Psychiatric/Behavioral: Negative.  Negative for confusion and dysphoric mood.    Objective:    BP 124/67   Pulse 64   Ht '5\' 11"'$  (1.803 m)   Wt 245 lb (111.1 kg)   BMI 34.17 kg/m   Wt Readings from Last 3 Encounters:  09/30/16 245 lb (111.1 kg)  06/16/16 249 lb (112.9 kg)  05/07/16 245 lb (111.1 kg)    Physical Exam  Constitutional: He is oriented to person, place, and time. He appears well-developed. He is cooperative. No distress.  HENT:  Head: Normocephalic and atraumatic.  Eyes: EOM are normal.  Neck: Normal range of motion. Neck supple. No tracheal deviation present. No thyromegaly present.  Cardiovascular: Normal rate, S1 normal, S2 normal and normal heart sounds.  Exam reveals no gallop.   No murmur heard. Pulses:      Dorsalis pedis pulses are 1+ on the right side, and 1+ on the left side.       Posterior tibial pulses are 1+ on the right side, and 1+ on  the left side.  Pulmonary/Chest: Breath sounds normal. No respiratory distress. He has no wheezes.  Abdominal: Soft. Bowel sounds are normal. He exhibits no distension. There is no tenderness. There is no guarding and no CVA tenderness.  Musculoskeletal: He exhibits no edema.       Right shoulder: He exhibits no swelling and no deformity.  Neurological: He is alert and oriented to person, place, and time. He has normal strength and normal reflexes. No cranial nerve deficit or sensory deficit. Gait normal.  Skin: Skin is warm and dry. No rash noted. No cyanosis. Nails show no clubbing.  Psychiatric: He has a normal mood and affect. His speech is normal and behavior is normal. Judgment and thought content normal. Cognition and memory are normal.     Recent Results (from the past 2160 hour(s))  Comprehensive metabolic panel     Status: Abnormal   Collection Time: 09/23/16  9:04 AM  Result Value Ref Range   Sodium 138 135 - 146 mmol/L   Potassium 4.1 3.5 - 5.3 mmol/L   Chloride 101 98 - 110 mmol/L   CO2 27 20 - 31 mmol/L   Glucose, Bld 161 (H) 65 - 99 mg/dL   BUN 19 7 - 25 mg/dL   Creat 1.23 (H) 0.70 - 1.18 mg/dL    Comment:   For patients > or = 71 years of age: The upper reference limit for Creatinine is approximately 13% higher for people identified as African-American.      Total Bilirubin 0.9 0.2 - 1.2 mg/dL   Alkaline Phosphatase 79 40 - 115 U/L   AST 26 10 - 35 U/L   ALT 19 9 - 46 U/L   Total Protein 6.7 6.1 - 8.1 g/dL   Albumin 3.9 3.6 - 5.1 g/dL   Calcium 9.0 8.6 - 10.3 mg/dL  Hemoglobin A1c     Status: Abnormal   Collection Time: 09/23/16  9:04 AM  Result Value Ref Range   Hgb A1c MFr Bld 8.3 (H) <5.7 %    Comment:   For someone without known diabetes, a hemoglobin A1c value of 6.5% or greater indicates that they may have diabetes and this should be confirmed with a follow-up test.   For someone with known diabetes, a value <7% indicates that their diabetes is well controlled and a value greater than or equal to 7% indicates suboptimal control. A1c targets should be individualized based on duration of diabetes, age, comorbid conditions, and other considerations.   Currently, no consensus exists for use of hemoglobin A1c for diagnosis of diabetes for children.      Mean Plasma Glucose 192 mg/dL     Assessment & Plan:   1. Uncontrolled type 2 diabetes mellitus with complication, without long-term current use of insulin ([place])   - Patient has currently uncontrolled symptomatic type 2 DM since  71 years of age. - He Stopped Lantus due to cost. He stayed on Januvia and metformin, tolerating both.  - His A1c Continued to improve to 8.3%, slowly improving from 10.2%.   His diabetes is complicated by  coronary artery disease requiring stent placement, obesity/sedentary life and patient remains at a high risk for more acute and chronic complications of diabetes which include CAD, CVA, CKD, retinopathy, and neuropathy. These are all discussed in detail with the patient.  - I have counseled the patient on diet management and weight loss, by adopting a carbohydrate restricted/protein rich diet.  - Suggestion is made for patient to avoid simple  carbohydrates   from their diet including Cakes , Desserts, Ice Cream,  Soda (  diet and regular) , Sweet Tea , Candies,  Chips, Cookies, Artificial Sweeteners,   and "Sugar-free" Products . This will help patient to have stable blood glucose profile and potentially avoid unintended weight gain.  - I encouraged the patient to switch to  unprocessed or minimally processed complex starch and increased protein intake (animal or plant source), fruits, and vegetables.  - Patient is advised to stick to a routine mealtimes to eat 3 meals  a day and avoid unnecessary snacks ( to snack only to correct hypoglycemia).  - The patient will be scheduled with Jearld Fenton, RDN, CDE for individualized DM education.  - I have approached patient with the following individualized plan to manage diabetes and patient agrees:     - He wishes to stay away from insulin for now mainly due to cost. I offered the cheaper premixed insulin Novolin 70/30, however, he will consider this if he loses control by next visit.  - He will continue  monitoring of blood glucose before breakfast and at bedtime.  - I advised him to continue metformin 500 mg by mouth twice a day and Januvia 100 mg by mouth every morning- as long as he has it. -Patient is encouraged to call clinic for blood glucose levels less than 70 or above 300 mg /dl.  - Patient specific target  A1c;  LDL, HDL, Triglycerides, and  Waist Circumference were discussed in detail.  2) BP/HTN: Controlled. Continue current  medications including ACEI/ARB. 3) Lipids/HPL:  Control unknown, continue statins. 4)  Weight/Diet: CDE Consult will be initiated , exercise, and detailed carbohydrates information provided.  5) Chronic Care/Health Maintenance:  -Patient is on ACEI/ARB and Statin medications and encouraged to continue to follow up with Ophthalmology, Podiatrist at least yearly or according to recommendations, and advised to  stay away from smoking. I have recommended yearly flu vaccine and pneumonia vaccination at least every 5 years; moderate intensity exercise for up to 150 minutes weekly; and  sleep for at least 7 hours a day.  - 30 minutes of time was spent on the care of this patient , 50% of which was applied for counseling on diabetes complications and their preventions.   - I advised patient to maintain close follow up with Celedonio Savage, MD for primary care needs.  Follow up plan: - Return in about 3 months (around 12/31/2016) for meter, and logs.  Glade Lloyd, MD Phone: [phone]  Fax: [phone]   09/30/2016, 11:55 AM

## 2016-09-30 NOTE — Patient Instructions (Signed)

## 2016-10-08 ENCOUNTER — Ambulatory Visit (INDEPENDENT_AMBULATORY_CARE_PROVIDER_SITE_OTHER): Payer: Medicare Other | Admitting: Cardiology

## 2016-10-08 ENCOUNTER — Encounter: Payer: Self-pay | Admitting: Cardiology

## 2016-10-08 VITALS — BP 123/72 | HR 65 | Ht 71.0 in | Wt 246.0 lb

## 2016-10-08 DIAGNOSIS — I10 Essential (primary) hypertension: Secondary | ICD-10-CM | POA: Diagnosis not present

## 2016-10-08 DIAGNOSIS — E1159 Type 2 diabetes mellitus with other circulatory complications: Secondary | ICD-10-CM

## 2016-10-08 DIAGNOSIS — E669 Obesity, unspecified: Secondary | ICD-10-CM

## 2016-10-08 DIAGNOSIS — I2510 Atherosclerotic heart disease of native coronary artery without angina pectoris: Secondary | ICD-10-CM | POA: Diagnosis not present

## 2016-10-08 DIAGNOSIS — E782 Mixed hyperlipidemia: Secondary | ICD-10-CM | POA: Diagnosis not present

## 2016-10-08 MED ORDER — ATORVASTATIN CALCIUM 80 MG PO TABS: 80.0000 mg | ORAL_TABLET | Freq: Every day | ORAL | 1 refills | Status: DC

## 2016-10-08 NOTE — Patient Instructions (Signed)
Your physician wants you to follow-up in: Johnny Huber will receive a reminder letter in the mail two months in advance. If you don't receive a letter, please call our office to schedule the follow-up appointment.  Your physician has recommended you make the following change in your medication:   INCREASE ATORVASTATIN 80 MG DAILY  You have been referred to NUTRITION   Thank you for choosing [facility]!!

## 2016-10-08 NOTE — Progress Notes (Signed)
Clinical Summary Johnny Huber is a 71 y.o.male seen today for follow up of the following medical problems.   1. CAD - pci to RCA Nov 2015 after abnormal stress test. Normal LV function at that time.    - no recent chest pain. No SOB or DOE - compliant with meds  2. DM2 - followed by pcp  3. HTN -he iscompliant with bp meds   4. Hyperlipidemia - Jan 2018 TC 147 TG 132 HDL 30 LDL 91 - he is on atorvastatin 62m daily   Past Medical History:  Diagnosis Date  . Asthmatic bronchitis   . Coronary atherosclerosis of native coronary artery   . DDD (degenerative disc disease), lumbar   . [place]   . Gastroesophageal reflux disease   . Hypertension   . Insomnia, unspecified   . Obstructive sleep apnea   . [place]   . Rhinitis, allergic      No Known Allergies   Current Outpatient Prescriptions  Medication Sig Dispense Refill  . albuterol (PROVENTIL HFA;VENTOLIN HFA) 108 (90 Base) MCG/ACT inhaler Inhale into the lungs every 6 (six) hours as needed for wheezing or shortness of breath.    .Marland KitchenamLODipine (NORVASC) 10 MG tablet Take 10 mg by mouth daily.    .Marland Kitchenaspirin EC 81 MG tablet Take 81 mg by mouth daily.    .Marland Kitchenatorvastatin (LIPITOR) 40 MG tablet Take 1 tablet (40 mg total) by mouth daily. 90 tablet 3  . cetirizine (ZYRTEC) 10 MG tablet Take 10 mg by mouth daily.    . Cholecalciferol (VITAMIN D3) 1000 units CAPS Take by mouth daily.    . citalopram (CELEXA) 20 MG tablet Take 20 mg by mouth daily.    .Marland Kitchengabapentin (NEURONTIN) 300 MG capsule Take 300 mg by mouth 3 (three) times daily.     .Marland Kitchenglucose monitoring kit (FREESTYLE) monitoring kit 1 each by Does not apply route as needed for other.    . Insulin Pen Needle (B-D ULTRAFINE III SHORT PEN) 31G X 8 MM MISC 1 each by Does not apply route as directed. 100 each 3  . metFORMIN (GLUCOPHAGE) 500 MG tablet Take 500 mg by mouth 2 (two) times daily with a meal.    . mometasone (NASONEX) 50 MCG/ACT nasal  spray Place 2 sprays into the nose daily.    . nitroGLYCERIN (NITROSTAT) 0.4 MG SL tablet Place 0.4 mg under the tongue every 5 (five) minutes as needed for chest pain.    . sitaGLIPtin (JANUVIA) 100 MG tablet Take 100 mg by mouth daily.    . traMADol (ULTRAM) 50 MG tablet Take 50 mg by mouth every 6 (six) hours as needed.    . valsartan-hydrochlorothiazide (DIOVAN-HCT) 160-12.5 MG tablet Take 1 tablet by mouth daily.     No current facility-administered medications for this visit.      Past Surgical History:  Procedure Laterality Date  . CARDIAC CATHETERIZATION  2015  . OTHER SURGICAL HISTORY  1983   R KNEE ARTHORSCOPY     No Known Allergies    Family History  Problem Relation Age of Onset  . Cancer Sister      Social History Mr. BKnerrreports that he quit smoking about 34 years ago. He has never used smokeless tobacco. Mr. BBoneyreports that he does not drink alcohol.   Review of Systems CONSTITUTIONAL: No weight loss, fever, chills, weakness or fatigue.  HEENT: Eyes: No visual loss, blurred vision, double vision or  yellow sclerae.No hearing loss, sneezing, congestion, runny nose or sore throat.  SKIN: No rash or itching.  CARDIOVASCULAR: per hpi RESPIRATORY: No shortness of breath, cough or sputum.  GASTROINTESTINAL: No anorexia, nausea, vomiting or diarrhea. No abdominal pain or blood.  GENITOURINARY: No burning on urination, no polyuria NEUROLOGICAL: No headache, dizziness, syncope, paralysis, ataxia, numbness or tingling in the extremities. No change in bowel or bladder control.  MUSCULOSKELETAL: No muscle, back pain, joint pain or stiffness.  LYMPHATICS: No enlarged nodes. No history of splenectomy.  PSYCHIATRIC: No history of depression or anxiety.  ENDOCRINOLOGIC: No reports of sweating, cold or heat intolerance. No polyuria or polydipsia.  Marland Kitchen   Physical Examination Vitals:   10/08/16 1309  BP: 123/72  Pulse: 65   Vitals:   10/08/16 1309  Weight: 246  lb (111.6 kg)  Height: _0  (1.803 m)    Gen: resting comfortably, no acute distress HEENT: no scleral icterus, pupils equal round and reactive, no palptable cervical adenopathy,  CV: RRR, no m/r/g, no jvd Resp: Clear to auscultation bilaterally GI: abdomen is soft, non-tender, non-distended, normal bowel sounds, no hepatosplenomegaly MSK: extremities are warm, no edema.  Skin: warm, no rash Neuro:  no focal deficits Psych: appropriate affect   Diagnostic Studies  03/2016 [facility] [place] No anerusym   Assessment and Plan   1. CAD - no chest pain or SOB - continue current meds  2 .HTN - his bp is at goal, continue current meds  3. Hyperlipidemia - increase atorvastatin 2m, LDL not at goal.   4. Obesity - refer to nutrtion  5. DM2 - last A1c 8.3, pcp working to intensify glucose control - from cardiac standpoint he is on ASA, statin, ARB - refer to nutrition    JArnoldo Lenis M.D.

## 2016-10-14 ENCOUNTER — Telehealth: Payer: Self-pay | Admitting: "Endocrinology

## 2016-10-14 MED ORDER — GLUCOSE BLOOD VI STRP: ORAL_STRIP | 1 refills | Status: AC

## 2016-10-14 MED ORDER — GLUCOSE BLOOD VI STRP: ORAL_STRIP | 1 refills | Status: DC

## 2016-10-14 NOTE — Telephone Encounter (Signed)
Needs new Rx for test strips free style life testing twice a day send 1 refill to [facility] in [place] and then [facility] for home delivery, please advise?

## 2016-10-30 ENCOUNTER — Encounter: Payer: Self-pay | Admitting: "Endocrinology

## 2016-10-30 ENCOUNTER — Ambulatory Visit (INDEPENDENT_AMBULATORY_CARE_PROVIDER_SITE_OTHER): Payer: Medicare Other | Admitting: "Endocrinology

## 2016-10-30 VITALS — BP 116/66 | HR 69 | Ht 71.0 in | Wt 249.0 lb

## 2016-10-30 DIAGNOSIS — Z6834 Body mass index (BMI) 34.0-34.9, adult: Secondary | ICD-10-CM

## 2016-10-30 DIAGNOSIS — I2510 Atherosclerotic heart disease of native coronary artery without angina pectoris: Secondary | ICD-10-CM | POA: Diagnosis not present

## 2016-10-30 DIAGNOSIS — E7800 Pure hypercholesterolemia, unspecified: Secondary | ICD-10-CM

## 2016-10-30 DIAGNOSIS — I10 Essential (primary) hypertension: Secondary | ICD-10-CM

## 2016-10-30 DIAGNOSIS — E1159 Type 2 diabetes mellitus with other circulatory complications: Secondary | ICD-10-CM | POA: Diagnosis not present

## 2016-10-30 DIAGNOSIS — E6609 Other obesity due to excess calories: Secondary | ICD-10-CM

## 2016-10-30 MED ORDER — INSULIN DEGLUDEC 200 UNIT/ML ~~LOC~~ SOPN: 20.0000 [IU] | PEN_INJECTOR | Freq: Every day | SUBCUTANEOUS | 0 refills | Status: DC

## 2016-10-30 NOTE — Progress Notes (Signed)
Subjective:    Patient ID: Johnny Huber, male    DOB: June 04, 1945. Patient is being seen in f/u  for management of diabetes requested by  Celedonio Savage, MD  Past Medical History:  Diagnosis Date  . Asthmatic bronchitis   . Coronary atherosclerosis of native coronary artery   . DDD (degenerative disc disease), lumbar   . Diabetes ([place])   . Gastroesophageal reflux disease   . Hypertension   . Insomnia, unspecified   . Obstructive sleep apnea   . Prostate cancer ([place])   . Rhinitis, allergic    Past Surgical History:  Procedure Laterality Date  . CARDIAC CATHETERIZATION  2015  . OTHER SURGICAL HISTORY  1983   R KNEE ARTHORSCOPY   Social History   Social History  . Marital status: Married    Spouse name: N/A  . Number of children: N/A  . Years of education: N/A   Social History Main Topics  . Smoking status: Former Smoker    Quit date: 09/01/1982  . Smokeless tobacco: Never Used  . Alcohol use No  . Drug use: No  . Sexual activity: Yes    Partners: Female   Other Topics Concern  . None   Social History Narrative  . None   Outpatient Encounter Prescriptions as of 10/30/2016  Medication Sig  . albuterol (PROVENTIL HFA;VENTOLIN HFA) 108 (90 Base) MCG/ACT inhaler Inhale into the lungs every 6 (six) hours as needed for wheezing or shortness of breath.  Marland Kitchen amLODipine (NORVASC) 10 MG tablet Take 10 mg by mouth daily.  Marland Kitchen aspirin EC 81 MG tablet Take 81 mg by mouth daily.  Marland Kitchen atorvastatin (LIPITOR) 80 MG tablet Take 1 tablet (80 mg total) by mouth daily.  . cetirizine (ZYRTEC) 10 MG tablet Take 10 mg by mouth daily.  . Cholecalciferol (VITAMIN D3) 1000 units CAPS Take by mouth daily.  . citalopram (CELEXA) 20 MG tablet Take 20 mg by mouth daily.  Marland Kitchen gabapentin (NEURONTIN) 300 MG capsule Take 300 mg by mouth 3 (three) times daily.   Marland Kitchen glucose blood (FREESTYLE LITE) test strip Use as instructed bid  . glucose monitoring kit (FREESTYLE) monitoring kit 1 each by Does not apply  route as needed for other.  . Insulin Degludec (TRESIBA FLEXTOUCH) 200 UNIT/ML SOPN Inject 20 Units into the skin at bedtime.  . Insulin Pen Needle (B-D ULTRAFINE III SHORT PEN) 31G X 8 MM MISC 1 each by Does not apply route as directed.  . metFORMIN (GLUCOPHAGE) 500 MG tablet Take 500 mg by mouth 2 (two) times daily with a meal.  . mometasone (NASONEX) 50 MCG/ACT nasal spray Place 2 sprays into the nose daily.  . nitroGLYCERIN (NITROSTAT) 0.4 MG SL tablet Place 0.4 mg under the tongue every 5 (five) minutes as needed for chest pain.  . sitaGLIPtin (JANUVIA) 100 MG tablet Take 100 mg by mouth daily.  . traMADol (ULTRAM) 50 MG tablet Take 50 mg by mouth every 6 (six) hours as needed.  . valsartan-hydrochlorothiazide (DIOVAN-HCT) 160-12.5 MG tablet Take 1 tablet by mouth daily.   No facility-administered encounter medications on file as of 10/30/2016.    ALLERGIES: No Known Allergies VACCINATION STATUS:  There is no immunization history on file for this patient.  Diabetes  He presents for his follow-up (Returns before his scheduled appointment due to hyperglycemia.) diabetic visit. He has type 2 diabetes mellitus. Onset time: He was diagnosed at approximate age of 7 years. His disease course has been worsening. There  are no hypoglycemic associated symptoms. Pertinent negatives for hypoglycemia include no confusion, headaches, pallor or seizures. Associated symptoms include blurred vision. Pertinent negatives for diabetes include no chest pain, no fatigue, no polydipsia, no polyphagia, no polyuria and no weakness. There are no hypoglycemic complications. Symptoms are worsening. Risk factors for coronary artery disease include diabetes mellitus, dyslipidemia, hypertension, family history, male sex, obesity, sedentary lifestyle and tobacco exposure. Current diabetic treatment includes oral agent (triple therapy) (He is on Januvia 100 mg by mouth daily, metformin 500 mg by mouth twice a day, glimepiride  4 mg by mouth daily). His weight is increasing steadily. He is following a generally unhealthy diet. When asked about meal planning, he reported none. He has not had a previous visit with a dietitian. He rarely participates in exercise. Home blood sugar record trend: His average blood glucose for the last 7 days is 216 of 24 readings. His breakfast blood glucose range is generally >200 mg/dl. His dinner blood glucose range is generally >200 mg/dl. His overall blood glucose range is >200 mg/dl. An ACE inhibitor/angiotensin II receptor blocker is being taken. Eye exam is current.  Hyperlipidemia  This is a chronic problem. The current episode started more than 1 year ago. Pertinent negatives include no chest pain, myalgias or shortness of breath. Current antihyperlipidemic treatment includes statins. Risk factors for coronary artery disease include dyslipidemia, diabetes mellitus, hypertension, male sex, obesity and a sedentary lifestyle.  Hypertension  This is a chronic problem. The current episode started more than 1 year ago. Associated symptoms include blurred vision. Pertinent negatives include no chest pain, headaches, neck pain, palpitations or shortness of breath. Risk factors for coronary artery disease include smoking/tobacco exposure, male gender, obesity, dyslipidemia, diabetes mellitus, sedentary lifestyle and family history. Past treatments include ACE inhibitors.     Review of Systems  Constitutional: Negative for chills, fatigue, fever and unexpected weight change.  HENT: Negative for dental problem, mouth sores and trouble swallowing.   Eyes: Positive for blurred vision. Negative for visual disturbance.  Respiratory: Negative for cough, choking, chest tightness, shortness of breath and wheezing.   Cardiovascular: Negative for chest pain, palpitations and leg swelling.  Gastrointestinal: Negative for abdominal distention, abdominal pain, constipation, diarrhea, nausea and vomiting.   Endocrine: Negative for polydipsia, polyphagia and polyuria.  Genitourinary: Negative for dysuria, flank pain, hematuria and urgency.  Musculoskeletal: Negative for back pain, gait problem, myalgias and neck pain.  Skin: Negative for pallor, rash and wound.  Neurological: Negative for seizures, syncope, weakness, numbness and headaches.  Psychiatric/Behavioral: Negative.  Negative for confusion and dysphoric mood.    Objective:    BP 116/66   Pulse 69   Ht _0  (1.803 m)   Wt 249 lb (112.9 kg)   BMI 34.73 kg/m   Wt Readings from Last 3 Encounters:  10/30/16 249 lb (112.9 kg)  10/08/16 246 lb (111.6 kg)  09/30/16 245 lb (111.1 kg)    Physical Exam  Constitutional: He is oriented to person, place, and time. He appears well-developed. He is cooperative. No distress.  HENT:  Head: Normocephalic and atraumatic.  Eyes: EOM are normal.  Neck: Normal range of motion. Neck supple. No tracheal deviation present. No thyromegaly present.  Cardiovascular: Normal rate, S1 normal, S2 normal and normal heart sounds.  Exam reveals no gallop.   No murmur heard. Pulses:      Dorsalis pedis pulses are 1+ on the right side, and 1+ on the left side.       Posterior  tibial pulses are 1+ on the right side, and 1+ on the left side.  Pulmonary/Chest: Breath sounds normal. No respiratory distress. He has no wheezes.  Abdominal: Soft. Bowel sounds are normal. He exhibits no distension. There is no tenderness. There is no guarding and no CVA tenderness.  Musculoskeletal: He exhibits no edema.       Right shoulder: He exhibits no swelling and no deformity.  Neurological: He is alert and oriented to person, place, and time. He has normal strength and normal reflexes. No cranial nerve deficit or sensory deficit. Gait normal.  Skin: Skin is warm and dry. No rash noted. No cyanosis. Nails show no clubbing.  Psychiatric: He has a normal mood and affect. His speech is normal and behavior is normal. Judgment  and thought content normal. Cognition and memory are normal.    Recent Results (from the past 2160 hour(s))  Comprehensive metabolic panel     Status: Abnormal   Collection Time: 09/23/16  9:04 AM  Result Value Ref Range   Sodium 138 135 - 146 mmol/L   Potassium 4.1 3.5 - 5.3 mmol/L   Chloride 101 98 - 110 mmol/L   CO2 27 20 - 31 mmol/L   Glucose, Bld 161 (H) 65 - 99 mg/dL   BUN 19 7 - 25 mg/dL   Creat 1.23 (H) 0.70 - 1.18 mg/dL    Comment:   For patients > or = 72 years of age: The upper reference limit for Creatinine is approximately 13% higher for people identified as African-American.      Total Bilirubin 0.9 0.2 - 1.2 mg/dL   Alkaline Phosphatase 79 40 - 115 U/L   AST 26 10 - 35 U/L   ALT 19 9 - 46 U/L   Total Protein 6.7 6.1 - 8.1 g/dL   Albumin 3.9 3.6 - 5.1 g/dL   Calcium 9.0 8.6 - 10.3 mg/dL  Hemoglobin A1c     Status: Abnormal   Collection Time: 09/23/16  9:04 AM  Result Value Ref Range   Hgb A1c MFr Bld 8.3 (H) <5.7 %    Comment:   For someone without known diabetes, a hemoglobin A1c value of 6.5% or greater indicates that they may have diabetes and this should be confirmed with a follow-up test.   For someone with known diabetes, a value <7% indicates that their diabetes is well controlled and a value greater than or equal to 7% indicates suboptimal control. A1c targets should be individualized based on duration of diabetes, age, comorbid conditions, and other considerations.   Currently, no consensus exists for use of hemoglobin A1c for diagnosis of diabetes for children.      Mean Plasma Glucose 192 mg/dL     Assessment & Plan:   1. Uncontrolled type 2 diabetes mellitus with complication, without long-term current use of insulin ([place])   - Patient has currently uncontrolled symptomatic type 2 DM since  71 years of age. - He Stopped Lantus due to cost. He stayed on Januvia and metformin, tolerating both.  - His A1c Continued to improve to 8.3%,  slowly improving from 10.2%.   His diabetes is complicated by coronary artery disease requiring stent placement, obesity/sedentary life and patient remains at a high risk for more acute and chronic complications of diabetes which include CAD, CVA, CKD, retinopathy, and neuropathy. These are all discussed in detail with the patient.  - I have counseled the patient on diet management and weight loss, by adopting a carbohydrate restricted/protein rich  diet.  - Suggestion is made for patient to avoid simple carbohydrates   from their diet including Cakes , Desserts, Ice Cream,  Soda (  diet and regular) , Sweet Tea , Candies,  Chips, Cookies, Artificial Sweeteners,   and "Sugar-free" Products . This will help patient to have stable blood glucose profile and potentially avoid unintended weight gain.  - I encouraged the patient to switch to  unprocessed or minimally processed complex starch and increased protein intake (animal or plant source), fruits, and vegetables.  - Patient is advised to stick to a routine mealtimes to eat 3 meals  a day and avoid unnecessary snacks ( to snack only to correct hypoglycemia).  - The patient will be scheduled with Jearld Fenton, RDN, CDE for individualized DM education.  - I have approached patient with the following individualized plan to manage diabetes and patient agrees:     - He returns to due to loss of control of glycemia averaging above 200 mg/dL since last visit. -   he  and his wife express his wish now to get treated with his next options.  - I discussed and initiated basal insulin with Tresiba 20 units daily at bedtime associated with monitoring of blood glucose 2 times a day-before breakfast and at bedtime. -Patient is encouraged to call clinic for blood glucose levels less than 70 or above 200 mg /dl. - I advised him to continue metformin 500 mg by mouth twice a day and Januvia 100 mg by mouth every morning- as long as he has it.  - Patient specific  target  A1c;  LDL, HDL, Triglycerides, and  Waist Circumference were discussed in detail.  2) BP/HTN: Controlled. Continue current medications including ACEI/ARB. 3) Lipids/HPL:  Control unknown, continue statins. 4)  Weight/Diet: CDE Consult will be initiated , exercise, and detailed carbohydrates information provided.  5) Chronic Care/Health Maintenance:  -Patient is on ACEI/ARB and Statin medications and encouraged to continue to follow up with Ophthalmology, Podiatrist at least yearly or according to recommendations, and advised to  stay away from smoking. I have recommended yearly flu vaccine and pneumonia vaccination at least every 5 years; moderate intensity exercise for up to 150 minutes weekly; and  sleep for at least 7 hours a day.  - 30 minutes of time was spent on the care of this patient , 50% of which was applied for counseling on diabetes complications and their preventions. - He is advised to bring his meter and logs on his next return visit in 10 weeks with labs.  - I advised patient to maintain close follow up with Celedonio Savage, MD for primary care needs.  Follow up plan: - Return in about 10 weeks (around 01/08/2017) for follow up with pre-visit labs, meter, and logs.  Glade Lloyd, MD Phone: [phone]  Fax: [phone]   10/30/2016, 1:59 PM

## 2016-10-30 NOTE — Patient Instructions (Signed)

## 2016-11-03 ENCOUNTER — Telehealth: Payer: Self-pay

## 2016-11-03 NOTE — Telephone Encounter (Signed)
Pts insurance will not cover [place]. Which insulin would you like to switch to

## 2016-11-04 NOTE — Telephone Encounter (Signed)
Try Toujeo , if not, Lantus or Levemir, same dose

## 2016-11-06 ENCOUNTER — Other Ambulatory Visit: Payer: Self-pay

## 2016-11-06 ENCOUNTER — Ambulatory Visit (INDEPENDENT_AMBULATORY_CARE_PROVIDER_SITE_OTHER): Payer: Medicare Other

## 2016-11-06 ENCOUNTER — Ambulatory Visit (INDEPENDENT_AMBULATORY_CARE_PROVIDER_SITE_OTHER): Payer: Medicare Other | Admitting: Orthopaedic Surgery

## 2016-11-06 ENCOUNTER — Encounter (INDEPENDENT_AMBULATORY_CARE_PROVIDER_SITE_OTHER): Payer: Self-pay | Admitting: Orthopaedic Surgery

## 2016-11-06 VITALS — BP 119/60 | HR 62 | Ht 70.0 in | Wt 250.0 lb

## 2016-11-06 DIAGNOSIS — I2510 Atherosclerotic heart disease of native coronary artery without angina pectoris: Secondary | ICD-10-CM

## 2016-11-06 DIAGNOSIS — M545 Low back pain: Secondary | ICD-10-CM | POA: Diagnosis not present

## 2016-11-06 DIAGNOSIS — G8929 Other chronic pain: Secondary | ICD-10-CM

## 2016-11-06 MED ORDER — INSULIN DEGLUDEC 200 UNIT/ML ~~LOC~~ SOPN: 20.0000 [IU] | PEN_INJECTOR | Freq: Every day | SUBCUTANEOUS | 0 refills | Status: DC

## 2016-11-06 NOTE — Progress Notes (Signed)
Office Visit Note   Patient: Johnny Huber           Date of Birth: [birth year]           MRN: [id number] Visit Date: 11/06/2016              Requested by: Celedonio Savage, MD [address] [place], [place] [id number] PCP: Celedonio Savage, MD   Assessment & Plan: Visit Diagnoses:  1. Chronic bilateral low back pain, with sciatica presence unspecified          With some neurogenic claudication.  Plan: At present his symptoms are not severe enough to consider diagnostic imaging or consideration for surgery. He has increased problems and is able to stand a much shorter time. Then current 15 minutes or has more difficulty walking with increased pain he can let us know otherwise I will recheck him in 4 months. We discussed walking ,resting ,and repeating to maintain cardiovascular fitness and help with his diabetes.  Follow-Up Instructions: Return in about 4 months (around 03/08/2017).   Orders:  Orders Placed This Encounter  Procedures  . XR Lumbar Spine 2-3 Views   No orders of the defined types were placed in this encounter.     Procedures: No procedures performed   Clinical Data: No additional findings.   Subjective: Chief Complaint  Patient presents with  . Lower Back - Pain    HPI 71 year old male with one-year history of back pain that radiates down the posterior knees with cramping type pain. He states is worse with driving. He can walk around the grocery store as long as he leans on a cart. He can stand for 15 minutes and then has to sit. He has not fallen. He is used ibuprofen with slight improvement. For longer drive such as going to gr versus stop get out and walk. Past history of prostate cancer 2011 knee arthroscopy 2010. He denies associated bowel or bladder symptoms no fever chills. He uses tramadol for pain and also is on Neurontin. He does have diabetes.  Review of Systems 14 point review of systems updated positive for type 2 diabetes., Hypertension, high  cholesterol. Class I obesity. Otherwise negative as it pertains to his history of present illness.   Objective: Vital Signs: BP 119/60   Pulse 62   Ht 5\' 10"  (1.778 m)   Wt 250 lb (113.4 kg)   BMI 35.87 kg/m   Physical Exam  Constitutional: He is oriented to person, place, and time. He appears well-developed and well-nourished.  HENT:  Head: Normocephalic and atraumatic.  Eyes: EOM are normal. Pupils are equal, round, and reactive to light.  Neck: No tracheal deviation present. No thyromegaly present.  Cardiovascular: Normal rate.   Pulmonary/Chest: Effort normal. He has no wheezes.  Abdominal: Soft. Bowel sounds are normal.  Musculoskeletal:  Patient can get from sitting standing on off the exam table. He's able to heel and toe walk. Posterior tibial pulses are palpable. Mild sciatic notch tenderness. Knee and ankle jerk are symmetrical. No calf atrophy. Normal hip range of motion these reach full extension mild crepitus right knee with range of motion. Negative Faber test.  Neurological: He is alert and oriented to person, place, and time.  Skin: Skin is warm and dry. Capillary refill takes less than 2 seconds.  Psychiatric: He has a normal mood and affect. His behavior is normal. Judgment and thought content normal.    Ortho Exam  Specialty Comments:  No specialty comments available.  Imaging: No results found.   PMFS History: Patient Active Problem List   Diagnosis Date Noted  . Hypercholesteremia 03/11/2016  . Class 1 obesity due to excess calories with serious comorbidity and body mass index (BMI) of 34.0 to 34.9 in adult 03/11/2016  . ANEMIA 12/31/2009  . RECTAL BLEEDING 12/31/2009  . DM type 2 causing vascular disease ([place]) 04/12/2007  . DEGENERATIVE JOINT DISEASE, RIGHT KNEE 04/12/2007  . KNEE PAIN 04/12/2007  . Essential hypertension, benign 04/12/2007   Past Medical History:  Diagnosis Date  . Asthmatic bronchitis   . Coronary atherosclerosis of native  coronary artery   . DDD (degenerative disc disease), lumbar   . Diabetes ([place])   . Gastroesophageal reflux disease   . Hypertension   . Insomnia, unspecified   . Obstructive sleep apnea   . Prostate cancer ([place])   . Rhinitis, allergic     Family History  Problem Relation Age of Onset  . Cancer Sister     Past Surgical History:  Procedure Laterality Date  . CARDIAC CATHETERIZATION  2015  . OTHER SURGICAL HISTORY  1983   R KNEE ARTHORSCOPY   Social History   Occupational History  . Not on file.   Social History Main Topics  . Smoking status: Former Smoker    Quit date: 09/01/1982  . Smokeless tobacco: Never Used  . Alcohol use No  . Drug use: No  . Sexual activity: Yes    Partners: Female

## 2016-11-06 NOTE — Telephone Encounter (Signed)
Will try to appeal PA. Pt happy with [place]. Samples given

## 2016-11-27 ENCOUNTER — Encounter (INDEPENDENT_AMBULATORY_CARE_PROVIDER_SITE_OTHER): Payer: Self-pay | Admitting: Orthopaedic Surgery

## 2016-11-27 ENCOUNTER — Ambulatory Visit (INDEPENDENT_AMBULATORY_CARE_PROVIDER_SITE_OTHER): Payer: Medicare Other | Admitting: Orthopaedic Surgery

## 2016-11-27 ENCOUNTER — Telehealth (INDEPENDENT_AMBULATORY_CARE_PROVIDER_SITE_OTHER): Payer: Self-pay

## 2016-11-27 VITALS — BP 103/61 | HR 64 | Ht 70.0 in | Wt 250.0 lb

## 2016-11-27 DIAGNOSIS — I2510 Atherosclerotic heart disease of native coronary artery without angina pectoris: Secondary | ICD-10-CM | POA: Diagnosis not present

## 2016-11-27 DIAGNOSIS — M5442 Lumbago with sciatica, left side: Secondary | ICD-10-CM | POA: Diagnosis not present

## 2016-11-27 DIAGNOSIS — G8929 Other chronic pain: Secondary | ICD-10-CM

## 2016-11-27 DIAGNOSIS — M5441 Lumbago with sciatica, right side: Secondary | ICD-10-CM

## 2016-11-27 NOTE — Progress Notes (Signed)
Office Visit Note   Patient: Johnny Huber           Date of Birth: 1945/08/02           MRN: [id number] Visit Date: 11/27/2016              Requested by: Celedonio Savage, MD [address], [place] [address] PCP: Celedonio Savage, MD   Assessment & Plan: Visit Diagnoses:  1. Chronic midline low back pain with bilateral sciatica     Plan: We'll proceed with lumbar MRI scan for evaluation of his persistent symptoms greater than the right than left leg with some claudication symptoms.  Follow-Up Instructions: No Follow-up on file.   Orders:  No orders of the defined types were placed in this encounter.  No orders of the defined types were placed in this encounter.     Procedures: No procedures performed   Clinical Data: No additional findings.   Subjective: Chief Complaint  Patient presents with  . Lower Back - Pain    HPI 71 year old male here for chronic low back pain with worse pain down the right than left leg. Patient states that he is here and ready to discuss surgery. He has increased pain with standing after 15 minutes and difficulty walking more than 10 minutes. He gets relief with sitting or supine position. Does better leaning over grocery cart. No associated bowel or bladder symptoms. Past history of prostate cancer 2011. Negative for chills or fever. Patient states his pain prohibits him from doing many things that he wants to do. Review of Systems 14 point review of systems is updated and is unchanged from 11/06/2016 office visit other than as mentioned above. Of note his obesity increased cholesterol, hypertension, type 2 diabetes with some PAD, degenerative right knee osteoarthritis.   Objective: Vital Signs: BP 103/61   Pulse 64   Ht 5\' 10"  (1.778 m)   Wt 250 lb (113.4 kg)   BMI 35.87 kg/m   Physical Exam  Constitutional: He is oriented to person, place, and time. He appears well-developed and well-nourished.  HENT:  Head: Normocephalic and  atraumatic.  Eyes: Pupils are equal, round, and reactive to light. EOM are normal.  Neck: No tracheal deviation present. No thyromegaly present.  Cardiovascular: Normal rate.   Pulmonary/Chest: Effort normal. He has no wheezes.  Abdominal: Soft. Bowel sounds are normal.  Neurological: He is alert and oriented to person, place, and time.  Skin: Skin is warm and dry. Capillary refill takes less than 2 seconds.  Psychiatric: He has a normal mood and affect. His behavior is normal. Judgment and thought content normal.    Ortho Exam patient is able to heel and toe walk posterior tibial pulses are palpable. He has mild sciatic notch tenderness. Knee jerk and ankle jerk 1+ and symmetrical. No calf atrophy negative [place]. Normal hip range of motion. No rash over exposed skin.  Specialty Comments:  No specialty comments available.  Imaging: No results found.   PMFS History: Patient Active Problem List   Diagnosis Date Noted  . Hypercholesteremia 03/11/2016  . Class 1 obesity due to excess calories with serious comorbidity and body mass index (BMI) of 34.0 to 34.9 in adult 03/11/2016  . ANEMIA 12/31/2009  . RECTAL BLEEDING 12/31/2009  . DM type 2 causing vascular disease ([place]) 04/12/2007  . DEGENERATIVE JOINT DISEASE, RIGHT KNEE 04/12/2007  . KNEE PAIN 04/12/2007  . Essential hypertension, benign 04/12/2007   Past Medical History:  Diagnosis  Date  . Asthmatic bronchitis   . Coronary atherosclerosis of native coronary artery   . DDD (degenerative disc disease), lumbar   . Diabetes ([place])   . Gastroesophageal reflux disease   . Hypertension   . Insomnia, unspecified   . Obstructive sleep apnea   . Prostate cancer ([place])   . Rhinitis, allergic     Family History  Problem Relation Age of Onset  . Cancer Sister     Past Surgical History:  Procedure Laterality Date  . CARDIAC CATHETERIZATION  2015  . OTHER SURGICAL HISTORY  1983   R KNEE ARTHORSCOPY   Social History    Occupational History  . Not on file.   Social History Main Topics  . Smoking status: Former Smoker    Quit date: 09/01/1982  . Smokeless tobacco: Never Used  . Alcohol use No  . Drug use: No  . Sexual activity: Yes    Partners: Female

## 2016-11-27 NOTE — Telephone Encounter (Signed)
Patient was seen by Dr Lorin Mercy today in [facility]. Was referred for lumbar spine MRI eval claudication. This will be precerted by staff that works in [place]. It was scheduled for 07/11/18o arriving at 845am

## 2016-12-03 ENCOUNTER — Encounter: Payer: Self-pay | Admitting: Orthopaedic Surgery

## 2016-12-04 ENCOUNTER — Telehealth: Payer: Self-pay | Admitting: "Endocrinology

## 2016-12-04 ENCOUNTER — Ambulatory Visit (INDEPENDENT_AMBULATORY_CARE_PROVIDER_SITE_OTHER): Payer: Medicare Other | Admitting: Orthopaedic Surgery

## 2016-12-04 ENCOUNTER — Encounter (INDEPENDENT_AMBULATORY_CARE_PROVIDER_SITE_OTHER): Payer: Self-pay | Admitting: Orthopaedic Surgery

## 2016-12-04 VITALS — BP 121/64 | HR 57 | Ht 70.0 in | Wt 250.0 lb

## 2016-12-04 DIAGNOSIS — I2510 Atherosclerotic heart disease of native coronary artery without angina pectoris: Secondary | ICD-10-CM

## 2016-12-04 DIAGNOSIS — M5136 Other intervertebral disc degeneration, lumbar region: Secondary | ICD-10-CM | POA: Diagnosis not present

## 2016-12-04 NOTE — Telephone Encounter (Signed)
[name] is calling on behalf of Johnny Huber they have questions regarding Metformin dosage, please advise?

## 2016-12-04 NOTE — Progress Notes (Signed)
Office Visit Note   Patient: Johnny Huber           Date of Birth: 14-Mar-1946           MRN: [id number] Visit Date: 12/04/2016              Requested by: Celedonio Savage, MD [address] [place], [place] [address] PCP: Celedonio Savage, MD   Assessment & Plan: Visit Diagnoses:  1. Other intervertebral disc degeneration, lumbar region     L5-S1   Plan: We'll set him up for physical therapy needs to work on weight loss core strengthening. I'll see him back in 4-5 weeks if he is not making progress will consider a single epidural 5 one level where he has broad-based disc protrusion with osteophyte formation.  Follow-Up Instructions: Return in about 4 weeks (around 01/01/2017).   Orders:  No orders of the defined types were placed in this encounter.  No orders of the defined types were placed in this encounter.     Procedures: No procedures performed   Clinical Data: No additional findings.   Subjective: Chief Complaint  Patient presents with  . Lower Back - Pain, Follow-up    HPI 71 year old male returns with ongoing chronic back pain that radiates into his thighs. He's worse with increased activities turning twisting. It's bothering him for many years gradually since tense to increase. He denies bowel or bladder symptoms no claudication symptoms. He does have diabetes and obesity.  Review of Systems 14 point review of systems updated and is unchanged from 11/06/2016 other than as mentioned in history of present illness. MRI scan lumbar has been obtained and is reviewed today with the patient and his wife. I gave him a copy of the report.   Objective: Vital Signs: BP 121/64   Pulse (!) 57   Ht 5\' 10"  (1.778 m)   Wt 250 lb (113.4 kg)   BMI 35.87 kg/m   Physical Exam  Constitutional: He is oriented to person, place, and time. He appears well-developed and well-nourished.  HENT:  Head: Normocephalic and atraumatic.  Eyes: Pupils are equal, round, and reactive to light.  EOM are normal.  Neck: No tracheal deviation present. No thyromegaly present.  Cardiovascular: Normal rate.   Pulmonary/Chest: Effort normal. He has no wheezes.  Abdominal: Soft. Bowel sounds are normal.  Truncal obesity  Musculoskeletal:  Patient has tenderness lumbar spine. Some discomfort straight leg raising mild sciatic notch tenderness. Normal hip range of motion. Reflexes are 2+ and symmetrical. Distal pulses are intact. No lymphadenopathy. Normal hip and knee range of motion.  Neurological: He is alert and oriented to person, place, and time.  Skin: Skin is warm and dry. Capillary refill takes less than 2 seconds.  Psychiatric: He has a normal mood and affect. His behavior is normal. Judgment and thought content normal.    Ortho Exam  Specialty Comments:  No specialty comments available.  Imaging:Lumbar MRI 12/03/2016 shows chronic disc degeneration at L5-S1 with small broad-based disc protrusion and accompanying spurring without neural impingement. He has some disc degeneration at other levels without impingement.    PMFS History: Patient Active Problem List   Diagnosis Date Noted  . Hypercholesteremia 03/11/2016  . Class 1 obesity due to excess calories with serious comorbidity and body mass index (BMI) of 34.0 to 34.9 in adult 03/11/2016  . ANEMIA 12/31/2009  . RECTAL BLEEDING 12/31/2009  . DM type 2 causing vascular disease ([place]) 04/12/2007  . DEGENERATIVE JOINT DISEASE,  RIGHT KNEE 04/12/2007  . KNEE PAIN 04/12/2007  . Essential hypertension, benign 04/12/2007   Past Medical History:  Diagnosis Date  . Asthmatic bronchitis   . Coronary atherosclerosis of native coronary artery   . DDD (degenerative disc disease), lumbar   . Diabetes ([place])   . Gastroesophageal reflux disease   . Hypertension   . Insomnia, unspecified   . Obstructive sleep apnea   . Prostate cancer ([place])   . Rhinitis, allergic     Family History  Problem Relation Age of Onset  . Cancer Sister      Past Surgical History:  Procedure Laterality Date  . CARDIAC CATHETERIZATION  2015  . OTHER SURGICAL HISTORY  1983   R KNEE ARTHORSCOPY   Social History   Occupational History  . Not on file.   Social History Main Topics  . Smoking status: Former Smoker    Quit date: 09/01/1982  . Smokeless tobacco: Never Used  . Alcohol use No  . Drug use: No  . Sexual activity: Yes    Partners: Female

## 2016-12-04 NOTE — Telephone Encounter (Signed)
Pt given Dr Emi Holes instruction on Metformin

## 2016-12-04 NOTE — Addendum Note (Signed)
Addended by: Meyer Cory on: 12/04/2016 11:52 AM   Modules accepted: Orders

## 2016-12-29 ENCOUNTER — Ambulatory Visit: Payer: Medicare Other | Admitting: Nutrition

## 2016-12-31 ENCOUNTER — Ambulatory Visit: Payer: Medicare Other | Admitting: "Endocrinology

## 2017-01-01 ENCOUNTER — Encounter (INDEPENDENT_AMBULATORY_CARE_PROVIDER_SITE_OTHER): Payer: Self-pay | Admitting: Orthopaedic Surgery

## 2017-01-01 ENCOUNTER — Ambulatory Visit (INDEPENDENT_AMBULATORY_CARE_PROVIDER_SITE_OTHER): Payer: Medicare Other | Admitting: Orthopaedic Surgery

## 2017-01-01 VITALS — BP 105/51 | HR 62 | Ht 71.0 in | Wt 249.0 lb

## 2017-01-01 DIAGNOSIS — M5441 Lumbago with sciatica, right side: Secondary | ICD-10-CM

## 2017-01-01 DIAGNOSIS — M1711 Unilateral primary osteoarthritis, right knee: Secondary | ICD-10-CM | POA: Diagnosis not present

## 2017-01-01 DIAGNOSIS — G8929 Other chronic pain: Secondary | ICD-10-CM | POA: Insufficient documentation

## 2017-01-01 DIAGNOSIS — I2510 Atherosclerotic heart disease of native coronary artery without angina pectoris: Secondary | ICD-10-CM | POA: Diagnosis not present

## 2017-01-01 NOTE — Progress Notes (Signed)
Office Visit Note   Patient: Johnny Huber           Date of Birth: 12-Feb-1946           MRN: [id number] Visit Date: 01/01/2017              Requested by: Celedonio Savage, MD [address] [place], [place] [address] PCP: Celedonio Savage, MD   Assessment & Plan: Visit Diagnoses:  1. Chronic right-sided low back pain with right-sided sciatica   2. Unilateral primary osteoarthritis, right knee     Plan: Patient got improvement with therapy. He's also but await his sugars are running better. He has problems with arthritis in his right knee but this point wants to defer knee arthroplasty. Check him back again as needed.  Follow-Up Instructions: Return if symptoms worsen or fail to improve.   Orders:  No orders of the defined types were placed in this encounter.  No orders of the defined types were placed in this encounter.     Procedures: No procedures performed   Clinical Data: No additional findings.   Subjective: Chief Complaint  Patient presents with  . Lower Back - Pain    HPI patient turns his gotten improvement in his back back is no longer hurting still has some pain in his knee on the right side sometimes it runs down his leg into his calf. He is working on losing weight which is helping his diabetes. His mobility is improved. He is sleeping better.  Review of Systems updated from 12/04/2016 and is unchanged other than mentioned in history of present illness.   Objective: Vital Signs: BP (!) 105/51   Pulse 62   Ht 5\' 11"  (1.803 m)   Wt 249 lb (112.9 kg)   BMI 34.73 kg/m   Physical Exam  Constitutional: He is oriented to person, place, and time. He appears well-developed and well-nourished.  HENT:  Head: Normocephalic and atraumatic.  Eyes: Pupils are equal, round, and reactive to light. EOM are normal.  Neck: No tracheal deviation present. No thyromegaly present.  Cardiovascular: Normal rate.   Pulmonary/Chest: Effort normal. He has no wheezes.    Abdominal: Soft. Bowel sounds are normal.  Musculoskeletal:  Persistent History leg raising normal heel toe gait. No hitting edema. No rash or exposed skin. Crepitus with right knee range of motion. He's wearing a knee brace on the outside of his jeans which helps his knee. No sciatic notch tenderness. EXAM Table Comfortably. Anterior Tib EHL Gastrocsoleus Is Strong.   Neurological: He is alert and oriented to person, place, and time.  Skin: Skin is warm and dry. Capillary refill takes less than 2 seconds.  Psychiatric: He has a normal mood and affect. His behavior is normal. Judgment and thought content normal.    Ortho Exam  Specialty Comments:  No specialty comments available.  Imaging: No results found.   PMFS History: Patient Active Problem List   Diagnosis Date Noted  . Chronic right-sided low back pain with right-sided sciatica 01/01/2017  . Hypercholesteremia 03/11/2016  . Class 1 obesity due to excess calories with serious comorbidity and body mass index (BMI) of 34.0 to 34.9 in adult 03/11/2016  . ANEMIA 12/31/2009  . RECTAL BLEEDING 12/31/2009  . DM type 2 causing vascular disease ([place]) 04/12/2007  . DEGENERATIVE JOINT DISEASE, RIGHT KNEE 04/12/2007  . KNEE PAIN 04/12/2007  . Essential hypertension, benign 04/12/2007   Past Medical History:  Diagnosis Date  . Asthmatic bronchitis   .  Coronary atherosclerosis of native coronary artery   . DDD (degenerative disc disease), lumbar   . Diabetes ([place])   . Gastroesophageal reflux disease   . Hypertension   . Insomnia, unspecified   . Obstructive sleep apnea   . Prostate cancer ([place])   . Rhinitis, allergic     Family History  Problem Relation Age of Onset  . Cancer Sister     Past Surgical History:  Procedure Laterality Date  . CARDIAC CATHETERIZATION  2015  . OTHER SURGICAL HISTORY  1983   R KNEE ARTHORSCOPY   Social History   Occupational History  . Not on file.   Social History Main Topics  .  Smoking status: Former Smoker    Quit date: 09/01/1982  . Smokeless tobacco: Never Used  . Alcohol use No  . Drug use: No  . Sexual activity: Yes    Partners: Female

## 2017-01-14 ENCOUNTER — Ambulatory Visit: Payer: Medicare Other | Admitting: "Endocrinology

## 2017-02-24 ENCOUNTER — Ambulatory Visit: Payer: Medicare Other | Admitting: Cardiology

## 2017-02-24 NOTE — Progress Notes (Deleted)
Clinical Summary Johnny Huber is a 71 y.o.male seen today for follow up of the following medical problems.   1. CAD - pci to RCA Nov 2015 after abnormal stress test. Normal LV function at that time.    - no recent chest pain. No SOB or DOE - compliant with meds  2. DM2 - followed by pcp  3. HTN -he iscompliant with bp meds   4. Hyperlipidemia - Jan 2018 TC 147 TG 132 HDL 30 LDL 91 - he is on atorvastatin '40mg'$  daily   Past Medical History:  Diagnosis Date  . Asthmatic bronchitis   . Coronary atherosclerosis of native coronary artery   . DDD (degenerative disc disease), lumbar   . Diabetes ([place])   . Gastroesophageal reflux disease   . Hypertension   . Insomnia, unspecified   . Obstructive sleep apnea   . Prostate cancer ([place])   . Rhinitis, allergic      No Known Allergies   Current Outpatient Prescriptions  Medication Sig Dispense Refill  . albuterol (PROVENTIL HFA;VENTOLIN HFA) 108 (90 Base) MCG/ACT inhaler Inhale into the lungs every 6 (six) hours as needed for wheezing or shortness of breath.    Marland Kitchen amLODipine (NORVASC) 10 MG tablet Take 10 mg by mouth daily.    Marland Kitchen aspirin EC 81 MG tablet Take 81 mg by mouth daily.    Marland Kitchen atorvastatin (LIPITOR) 80 MG tablet Take 1 tablet (80 mg total) by mouth daily. 90 tablet 1  . cetirizine (ZYRTEC) 10 MG tablet Take 10 mg by mouth daily.    . Cholecalciferol (VITAMIN D3) 1000 units CAPS Take by mouth daily.    . citalopram (CELEXA) 20 MG tablet Take 20 mg by mouth daily.    Marland Kitchen EFFIENT 10 MG TABS tablet     . gabapentin (NEURONTIN) 300 MG capsule Take 300 mg by mouth 3 (three) times daily.     Marland Kitchen glimepiride (AMARYL) 4 MG tablet     . glucose blood (FREESTYLE LITE) test strip Use as instructed bid 100 each 1  . glucose monitoring kit (FREESTYLE) monitoring kit 1 each by Does not apply route as needed for other.    . Insulin Degludec (TRESIBA FLEXTOUCH) 200 UNIT/ML SOPN Inject 20 Units into the skin at bedtime. 9 mL 0  .  Insulin Pen Needle (B-D ULTRAFINE III SHORT PEN) 31G X 8 MM MISC 1 each by Does not apply route as directed. 100 each 3  . metFORMIN (GLUCOPHAGE) 500 MG tablet Take 500 mg by mouth 2 (two) times daily with a meal.    . mometasone (NASONEX) 50 MCG/ACT nasal spray Place 2 sprays into the nose daily.    . nitroGLYCERIN (NITROSTAT) 0.4 MG SL tablet Place 0.4 mg under the tongue every 5 (five) minutes as needed for chest pain.    . sitaGLIPtin (JANUVIA) 100 MG tablet Take 100 mg by mouth daily.    . traMADol (ULTRAM) 50 MG tablet Take 50 mg by mouth every 6 (six) hours as needed.    . valsartan-hydrochlorothiazide (DIOVAN-HCT) 160-12.5 MG tablet Take 1 tablet by mouth daily.     No current facility-administered medications for this visit.      Past Surgical History:  Procedure Laterality Date  . CARDIAC CATHETERIZATION  2015  . OTHER SURGICAL HISTORY  1983   R KNEE ARTHORSCOPY     No Known Allergies    Family History  Problem Relation Age of Onset  . Cancer Sister  Social History Johnny Huber reports that he quit smoking about 34 years ago. He has never used smokeless tobacco. Johnny Huber reports that he does not drink alcohol.   Review of Systems CONSTITUTIONAL: No weight loss, fever, chills, weakness or fatigue.  HEENT: Eyes: No visual loss, blurred vision, double vision or yellow sclerae.No hearing loss, sneezing, congestion, runny nose or sore throat.  SKIN: No rash or itching.  CARDIOVASCULAR:  RESPIRATORY: No shortness of breath, cough or sputum.  GASTROINTESTINAL: No anorexia, nausea, vomiting or diarrhea. No abdominal pain or blood.  GENITOURINARY: No burning on urination, no polyuria NEUROLOGICAL: No headache, dizziness, syncope, paralysis, ataxia, numbness or tingling in the extremities. No change in bowel or bladder control.  MUSCULOSKELETAL: No muscle, back pain, joint pain or stiffness.  LYMPHATICS: No enlarged nodes. No history of splenectomy.  PSYCHIATRIC: No  history of depression or anxiety.  ENDOCRINOLOGIC: No reports of sweating, cold or heat intolerance. No polyuria or polydipsia.  Marland Kitchen   Physical Examination There were no vitals filed for this visit. There were no vitals filed for this visit.  Gen: resting comfortably, no acute distress HEENT: no scleral icterus, pupils equal round and reactive, no palptable cervical adenopathy,  CV Resp: Clear to auscultation bilaterally GI: abdomen is soft, non-tender, non-distended, normal bowel sounds, no hepatosplenomegaly MSK: extremities are warm, no edema.  Skin: warm, no rash Neuro:  no focal deficits Psych: appropriate affect   Diagnostic Studies 03/2016 [facility] [place] No anerusym    Assessment and Plan  1. CAD - no chest pain or SOB - continue current meds  2 .HTN - his bp is at goal, continue current meds  3. Hyperlipidemia - increase atorvastatin '80mg'$ , LDL not at goal.   4. Obesity - refer to nutrtion  5. DM2 - last A1c 8.3, pcp working to intensify glucose control - from cardiac standpoint he is on ASA, statin, ARB - refer to nutrition       Arnoldo Lenis, M.D., F.A.C.C.

## 2017-03-05 ENCOUNTER — Encounter (INDEPENDENT_AMBULATORY_CARE_PROVIDER_SITE_OTHER): Payer: Self-pay | Admitting: Orthopaedic Surgery

## 2017-03-05 ENCOUNTER — Ambulatory Visit (INDEPENDENT_AMBULATORY_CARE_PROVIDER_SITE_OTHER): Payer: Medicare Other | Admitting: Orthopaedic Surgery

## 2017-03-05 VITALS — BP 102/56 | HR 60

## 2017-03-05 DIAGNOSIS — I2510 Atherosclerotic heart disease of native coronary artery without angina pectoris: Secondary | ICD-10-CM | POA: Diagnosis not present

## 2017-03-05 DIAGNOSIS — M5441 Lumbago with sciatica, right side: Secondary | ICD-10-CM | POA: Diagnosis not present

## 2017-03-05 DIAGNOSIS — G8929 Other chronic pain: Secondary | ICD-10-CM

## 2017-03-05 DIAGNOSIS — M1711 Unilateral primary osteoarthritis, right knee: Secondary | ICD-10-CM | POA: Diagnosis not present

## 2017-03-05 NOTE — Progress Notes (Signed)
Office Visit Note   Patient: Johnny Huber           Date of Birth: 14-Feb-1946           MRN: [id number] Visit Date: 03/05/2017              Requested by: Celedonio Savage, MD [address] [place], [place] [address] PCP: Celedonio Savage, MD   Assessment & Plan: Visit Diagnoses:  1. Unilateral primary osteoarthritis, right knee   2. Chronic right-sided low back pain with right-sided sciatica     Plan:Patient is making good progress physical therapy is walking better pain is improved. He still has some pain radiates from his back down his right leg related to the L5-S1 broad-based disc protrusion with some narrowing. Knee continues to bother me using a wrap on knee sleeve. He's been thinking about total knee arthroplasty with trying to put off as long as he can. I'll recheck him again in 4 months for recheck.  Follow-Up Instructions: Return in about 4 months (around 07/06/2017).   Orders:  No orders of the defined types were placed in this encounter.  No orders of the defined types were placed in this encounter.     Procedures: No procedures performed   Clinical Data: No additional findings.   Subjective: Chief Complaint  Patient presents with  . Lower Back - Pain, Follow-up    HPI patient returns for follow-up of back and right leg symptoms as well as primary knee osteoarthritis. He's gotten significant improvement with physical therapy is moving better as far as his back is concerned and has gotten decent relief with pain. He has diabetes he is working on some weight loss and with his core strengthening his back symptoms have improved.  Review of Systems updated and unchanged from last office visit   Objective: Vital Signs: BP (!) 102/56   Pulse 60   Physical Exam  Constitutional: He is oriented to person, place, and time. He appears well-developed and well-nourished.  HENT:  Head: Normocephalic and atraumatic.  Eyes: Pupils are equal, round, and reactive to light.  EOM are normal.  Neck: No tracheal deviation present. No thyromegaly present.  Cardiovascular: Normal rate.   Pulmonary/Chest: Effort normal. He has no wheezes.  Abdominal: Soft. Bowel sounds are normal.  Neurological: He is alert and oriented to person, place, and time.  Skin: Skin is warm and dry. Capillary refill takes less than 2 seconds.  Psychiatric: He has a normal mood and affect. His behavior is normal. Judgment and thought content normal.    Ortho Exam History leg raising 90. Distal pulses are intact he has some synovitis of the right knee crepitus with knee range of motion. He's been using a wraparound knee sleeve with a central hole. Medial joint line tenderness mild varus deformity. No pain with hip range of motion. Reflexes are 2+ and symmetrical.  Specialty Comments:  No specialty comments available.  Imaging: No results found.   PMFS History: Patient Active Problem List   Diagnosis Date Noted  . Unilateral primary osteoarthritis, right knee 03/05/2017  . Chronic right-sided low back pain with right-sided sciatica 01/01/2017  . Hypercholesteremia 03/11/2016  . Class 1 obesity due to excess calories with serious comorbidity and body mass index (BMI) of 34.0 to 34.9 in adult 03/11/2016  . ANEMIA 12/31/2009  . RECTAL BLEEDING 12/31/2009  . DM type 2 causing vascular disease ([place]) 04/12/2007  . DEGENERATIVE JOINT DISEASE, RIGHT KNEE 04/12/2007  . KNEE PAIN  04/12/2007  . Essential hypertension, benign 04/12/2007   Past Medical History:  Diagnosis Date  . Asthmatic bronchitis   . Coronary atherosclerosis of native coronary artery   . DDD (degenerative disc disease), lumbar   . Diabetes ([place])   . Gastroesophageal reflux disease   . Hypertension   . Insomnia, unspecified   . Obstructive sleep apnea   . Prostate cancer ([place])   . Rhinitis, allergic     Family History  Problem Relation Age of Onset  . Cancer Sister     Past Surgical History:  Procedure  Laterality Date  . CARDIAC CATHETERIZATION  2015  . OTHER SURGICAL HISTORY  1983   R KNEE ARTHORSCOPY   Social History   Occupational History  . Not on file.   Social History Main Topics  . Smoking status: Former Smoker    Quit date: 09/01/1982  . Smokeless tobacco: Never Used  . Alcohol use No  . Drug use: No  . Sexual activity: Yes    Partners: Female

## 2017-04-06 ENCOUNTER — Ambulatory Visit: Payer: Medicare Other | Admitting: Cardiology

## 2017-04-06 NOTE — Progress Notes (Deleted)
Clinical Summary Mr. Schnyder is a 71 y.o.male seen today for follow up of the following medical problems.   1. CAD - pci to RCA Nov 2015 after abnormal stress test. Normal LV function at that time.    - no recent chest pain. No SOB or DOE - compliant with meds  2. DM2 - followed by pcp  3. HTN -he iscompliant with bp meds   4. Hyperlipidemia - Jan 2018 TC 147 TG 132 HDL 30 LDL 91 - he is on atorvastatin 53m daily Past Medical History:  Diagnosis Date  . Asthmatic bronchitis   . Coronary atherosclerosis of native coronary artery   . DDD (degenerative disc disease), lumbar   . Diabetes ([place]   . Gastroesophageal reflux disease   . Hypertension   . Insomnia, unspecified   . Obstructive sleep apnea   . Prostate cancer ([place]   . Rhinitis, allergic      No Known Allergies   Current Outpatient Medications  Medication Sig Dispense Refill  . albuterol (PROVENTIL HFA;VENTOLIN HFA) 108 (90 Base) MCG/ACT inhaler Inhale into the lungs every 6 (six) hours as needed for wheezing or shortness of breath.    .Marland KitchenamLODipine (NORVASC) 10 MG tablet Take 10 mg by mouth daily.    .Marland Kitchenaspirin EC 81 MG tablet Take 81 mg by mouth daily.    .Marland Kitchenatorvastatin (LIPITOR) 80 MG tablet Take 1 tablet (80 mg total) by mouth daily. 90 tablet 1  . cetirizine (ZYRTEC) 10 MG tablet Take 10 mg by mouth daily.    . Cholecalciferol (VITAMIN D3) 1000 units CAPS Take by mouth daily.    . citalopram (CELEXA) 20 MG tablet Take 20 mg by mouth daily.    .Marland KitchenEFFIENT 10 MG TABS tablet     . gabapentin (NEURONTIN) 300 MG capsule Take 300 mg by mouth 3 (three) times daily.     .Marland Kitchenglimepiride (AMARYL) 4 MG tablet     . glucose blood (FREESTYLE LITE) test strip Use as instructed bid 100 each 1  . glucose monitoring kit (FREESTYLE) monitoring kit 1 each by Does not apply route as needed for other.    . Insulin Degludec (TRESIBA FLEXTOUCH) 200 UNIT/ML SOPN Inject 20 Units into the skin at bedtime. 9 mL 0  .  Insulin Pen Needle (B-D ULTRAFINE III SHORT PEN) 31G X 8 MM MISC 1 each by Does not apply route as directed. 100 each 3  . metFORMIN (GLUCOPHAGE) 500 MG tablet Take 500 mg by mouth 2 (two) times daily with a meal.    . mometasone (NASONEX) 50 MCG/ACT nasal spray Place 2 sprays into the nose daily.    . nitroGLYCERIN (NITROSTAT) 0.4 MG SL tablet Place 0.4 mg under the tongue every 5 (five) minutes as needed for chest pain.    . sitaGLIPtin (JANUVIA) 100 MG tablet Take 100 mg by mouth daily.    . traMADol (ULTRAM) 50 MG tablet Take 50 mg by mouth every 6 (six) hours as needed.    . valsartan-hydrochlorothiazide (DIOVAN-HCT) 160-12.5 MG tablet Take 1 tablet by mouth daily.     No current facility-administered medications for this visit.      Past Surgical History:  Procedure Laterality Date  . CARDIAC CATHETERIZATION  2015  . OTHER SURGICAL HISTORY  1983   R KNEE ARTHORSCOPY     No Known Allergies    Family History  Problem Relation Age of Onset  . Cancer Sister  Social History Mr. Shiraishi reports that he quit smoking about 34 years ago. he has never used smokeless tobacco. Mr. Rudden reports that he does not drink alcohol.   Review of Systems CONSTITUTIONAL: No weight loss, fever, chills, weakness or fatigue.  HEENT: Eyes: No visual loss, blurred vision, double vision or yellow sclerae.No hearing loss, sneezing, congestion, runny nose or sore throat.  SKIN: No rash or itching.  CARDIOVASCULAR:  RESPIRATORY: No shortness of breath, cough or sputum.  GASTROINTESTINAL: No anorexia, nausea, vomiting or diarrhea. No abdominal pain or blood.  GENITOURINARY: No burning on urination, no polyuria NEUROLOGICAL: No headache, dizziness, syncope, paralysis, ataxia, numbness or tingling in the extremities. No change in bowel or bladder control.  MUSCULOSKELETAL: No muscle, back pain, joint pain or stiffness.  LYMPHATICS: No enlarged nodes. No history of splenectomy.  PSYCHIATRIC: No  history of depression or anxiety.  ENDOCRINOLOGIC: No reports of sweating, cold or heat intolerance. No polyuria or polydipsia.  Marland Kitchen   Physical Examination There were no vitals filed for this visit. There were no vitals filed for this visit.  Gen: resting comfortably, no acute distress HEENT: no scleral icterus, pupils equal round and reactive, no palptable cervical adenopathy,  CV Resp: Clear to auscultation bilaterally GI: abdomen is soft, non-tender, non-distended, normal bowel sounds, no hepatosplenomegaly MSK: extremities are warm, no edema.  Skin: warm, no rash Neuro:  no focal deficits Psych: appropriate affect   Diagnostic Studies 03/2016 [facility] [place] No anerusym    Assessment and Plan  1. CAD - no chest pain or SOB - continue current meds  2 .HTN - his bp is at goal, continue current meds  3. Hyperlipidemia - increase atorvastatin 24m, LDL not at goal.   4. Obesity - refer to nutrtion  5. DM2 - last A1c 8.3, pcp working to intensify glucose control - from cardiac standpoint he is on ASA, statin, ARB - refer to nutrition      JArnoldo Lenis M.D.

## 2017-06-09 ENCOUNTER — Ambulatory Visit: Payer: Medicare Other | Admitting: Cardiology

## 2017-06-12 ENCOUNTER — Other Ambulatory Visit: Payer: Self-pay | Admitting: Cardiology

## 2017-07-01 ENCOUNTER — Ambulatory Visit (INDEPENDENT_AMBULATORY_CARE_PROVIDER_SITE_OTHER): Payer: Medicare Other | Admitting: Cardiology

## 2017-07-01 ENCOUNTER — Encounter: Payer: Self-pay | Admitting: Cardiology

## 2017-07-01 VITALS — BP 118/58 | HR 63 | Ht 71.0 in | Wt 248.4 lb

## 2017-07-01 DIAGNOSIS — I10 Essential (primary) hypertension: Secondary | ICD-10-CM

## 2017-07-01 DIAGNOSIS — I2510 Atherosclerotic heart disease of native coronary artery without angina pectoris: Secondary | ICD-10-CM

## 2017-07-01 DIAGNOSIS — E782 Mixed hyperlipidemia: Secondary | ICD-10-CM

## 2017-07-01 NOTE — Progress Notes (Signed)
Clinical Summary Mr. Colden is a 72 y.o.male seen today for follow up of the following medical problems.   1. CAD - former patient of [facility] - pci to RCA Nov 2015 after abnormal stress test. Normal LV function at that time.    - no chest pain. No SOB/DOE - compliant with meds.    2. DM2 - followed by pcp  3. HTN - compliant with meds   4. Hyperlipidemia - 04/2017 TC 125 TG 123 HDL 31 LDL 69 -compliant with statin.        Past Medical History:  Diagnosis Date  . Asthmatic bronchitis   . Coronary atherosclerosis of native coronary artery   . DDD (degenerative disc disease), lumbar   . Diabetes ([place])   . Gastroesophageal reflux disease   . Hypertension   . Insomnia, unspecified   . Obstructive sleep apnea   . Prostate cancer ([place])   . Rhinitis, allergic      No Known Allergies   Current Outpatient Medications  Medication Sig Dispense Refill  . albuterol (PROVENTIL HFA;VENTOLIN HFA) 108 (90 Base) MCG/ACT inhaler Inhale into the lungs every 6 (six) hours as needed for wheezing or shortness of breath.    Marland Kitchen amLODipine (NORVASC) 10 MG tablet Take 10 mg by mouth daily.    Marland Kitchen aspirin EC 81 MG tablet Take 81 mg by mouth daily.    Marland Kitchen atorvastatin (LIPITOR) 80 MG tablet TAKE 1 TABLET DAILY (DOSE INCREASE) 90 tablet 1  . cetirizine (ZYRTEC) 10 MG tablet Take 10 mg by mouth daily.    . Cholecalciferol (VITAMIN D3) 1000 units CAPS Take by mouth daily.    . citalopram (CELEXA) 20 MG tablet Take 20 mg by mouth daily.    Marland Kitchen EFFIENT 10 MG TABS tablet     . glimepiride (AMARYL) 4 MG tablet     . glucose blood (FREESTYLE LITE) test strip Use as instructed bid 100 each 1  . glucose monitoring kit (FREESTYLE) monitoring kit 1 each by Does not apply route as needed for other.    . Insulin Degludec (TRESIBA FLEXTOUCH) 200 UNIT/ML SOPN Inject 20 Units into the skin at bedtime. 9 mL 0  . Insulin Pen Needle (B-D ULTRAFINE III SHORT PEN) 31G X 8 MM MISC 1 each by  Does not apply route as directed. 100 each 3  . metFORMIN (GLUCOPHAGE) 500 MG tablet Take 500 mg by mouth 2 (two) times daily with a meal.    . mometasone (NASONEX) 50 MCG/ACT nasal spray Place 2 sprays into the nose daily.    . nitroGLYCERIN (NITROSTAT) 0.4 MG SL tablet Place 0.4 mg under the tongue every 5 (five) minutes as needed for chest pain.    Marland Kitchen rOPINIRole (REQUIP) 1 MG tablet Take 1 tablet by mouth 2 (two) times daily.    . sitaGLIPtin (JANUVIA) 100 MG tablet Take 100 mg by mouth daily.    . traMADol (ULTRAM) 50 MG tablet Take 50 mg by mouth every 6 (six) hours as needed.    . valsartan-hydrochlorothiazide (DIOVAN-HCT) 160-12.5 MG tablet Take 1 tablet by mouth daily.     No current facility-administered medications for this visit.      Past Surgical History:  Procedure Laterality Date  . CARDIAC CATHETERIZATION  2015  . OTHER SURGICAL HISTORY  1983   R KNEE ARTHORSCOPY     No Known Allergies    Family History  Problem Relation Age of Onset  . Cancer Sister  Social History Mr. Trumpower reports that he quit smoking about 34 years ago. he has never used smokeless tobacco. Mr. Rivet reports that he does not drink alcohol.   Review of Systems CONSTITUTIONAL: No weight loss, fever, chills, weakness or fatigue.  HEENT: Eyes: No visual loss, blurred vision, double vision or yellow sclerae.No hearing loss, sneezing, congestion, runny nose or sore throat.  SKIN: No rash or itching.  CARDIOVASCULAR: per hpi RESPIRATORY: No shortness of breath, cough or sputum.  GASTROINTESTINAL: No anorexia, nausea, vomiting or diarrhea. No abdominal pain or blood.  GENITOURINARY: No burning on urination, no polyuria NEUROLOGICAL: No headache, dizziness, syncope, paralysis, ataxia, numbness or tingling in the extremities. No change in bowel or bladder control.  MUSCULOSKELETAL: No muscle, back pain, joint pain or stiffness.  LYMPHATICS: No enlarged nodes. No history of splenectomy.    PSYCHIATRIC: No history of depression or anxiety.  ENDOCRINOLOGIC: No reports of sweating, cold or heat intolerance. No polyuria or polydipsia.  Marland Kitchen   Physical Examination Vitals:   07/01/17 1524  BP: (!) 118/58  Pulse: 63  SpO2: 95%   Filed Weights   07/01/17 1524  Weight: 248 lb 6.4 oz (112.7 kg)    Gen: resting comfortably, no acute distress HEENT: no scleral icterus, pupils equal round and reactive, no palptable cervical adenopathy,  CV: RRR, no m/r/g, no jvd Resp: Clear to auscultation bilaterally GI: abdomen is soft, non-tender, non-distended, normal bowel sounds, no hepatosplenomegaly MSK: extremities are warm, no edema.  Skin: warm, no rash Neuro:  no focal deficits Psych: appropriate affect   Diagnostic Studies 03/2016 AAA [place] No anerusym  03/2014 cath Impression:  1.Totally occluded LAD with strong right to left collaterals 2.Severe disease in the circumflex but small caliber vessel 3.Severe disease in the future dominant right coronary artery that was responsible for collaterals to the LAD. 4.Successful direct stenting with a bare-metal stent of the RCA 5.Preserved left ventricular function   Plan: 1.Dual and a platelet therapy  Assessment and Plan  1. CAD -no symptoms, continue current meds - EKG today SR without ischemic changes.   2 .HTN - at goal, continue meds.   3. Hyperlipidemia - at goal, continue statin.         Arnoldo Lenis, M.D.

## 2017-07-01 NOTE — Patient Instructions (Signed)
Medication Instructions:  Your physician has recommended you make the following change in your medication:   STOP effient  Please continue all other medications as prescribed  Labwork: NONE  Testing/Procedures: NONE  Follow-Up: Your physician wants you to follow-up in: [place] DR. BRANCH You will receive a reminder letter in the mail two months in advance. If you don't receive a letter, please call our office to schedule the follow-up appointment.  Any Other Special Instructions Will Be Listed Below (If Applicable).  If you need a refill on your cardiac medications before your next appointment, please call your pharmacy.

## 2017-07-02 ENCOUNTER — Ambulatory Visit (INDEPENDENT_AMBULATORY_CARE_PROVIDER_SITE_OTHER): Payer: Medicare Other | Admitting: Orthopaedic Surgery

## 2017-07-02 ENCOUNTER — Encounter (INDEPENDENT_AMBULATORY_CARE_PROVIDER_SITE_OTHER): Payer: Self-pay | Admitting: Orthopaedic Surgery

## 2017-07-02 ENCOUNTER — Ambulatory Visit (INDEPENDENT_AMBULATORY_CARE_PROVIDER_SITE_OTHER): Payer: Medicare Other

## 2017-07-02 VITALS — BP 106/66 | HR 62 | Ht 71.0 in | Wt 248.0 lb

## 2017-07-02 DIAGNOSIS — M25561 Pain in right knee: Secondary | ICD-10-CM

## 2017-07-02 DIAGNOSIS — G8929 Other chronic pain: Secondary | ICD-10-CM

## 2017-07-02 DIAGNOSIS — M5441 Lumbago with sciatica, right side: Secondary | ICD-10-CM

## 2017-07-02 DIAGNOSIS — I2510 Atherosclerotic heart disease of native coronary artery without angina pectoris: Secondary | ICD-10-CM | POA: Diagnosis not present

## 2017-07-02 NOTE — Progress Notes (Signed)
Office Visit Note   Patient: Johnny Huber           Date of Birth: [birth year]           MRN: [id number] Visit Date: 07/02/2017              Requested by: Celedonio Savage, MD [place], [place] [address] PCP: Bridget Hartshorn, NP   Assessment & Plan: Visit Diagnoses:  1. Chronic pain of right knee   2. Chronic right-sided low back pain with right-sided sciatica     Plan: Lumbar epidural for L5-S1 disc degeneration broad-based protrusion with right leg symptoms.  Continue knee sleeve for his arthritis in his knee.  The knee symptoms are not significant enough to consider total knee arthroplasty at this time.  Follow-up after the epidural.  Follow-Up Instructions: No Follow-up on file.   Orders:  Orders Placed This Encounter  Procedures  . XR Knee 1-2 Views Right  . Ambulatory referral to Physical Medicine Rehab   No orders of the defined types were placed in this encounter.     Procedures: No procedures performed   Clinical Data: No additional findings.   Subjective: Chief Complaint  Patient presents with  . Lower Back - Pain, Follow-up    HPI 72 year old male returns with ongoing back pain 29-month follow-up with right greater than left leg symptoms.  Symptoms are progressing and wants to discuss surgery.  he also has ongoing problems with right knee pain  Review of Systems positive for high cholesterol hypertension, PAD, class I obesity, type 2 diabetes right knee osteoarthritis low back pain with L5-S1 broad-based disc protrusion.  Otherwise negative as it pertains HPI.   Objective: Vital Signs: BP 106/66   Pulse 62   Ht 5\' 11"  (1.803 m)   Wt 248 lb (112.5 kg)   BMI 34.59 kg/m   Physical Exam  Constitutional: He is oriented to person, place, and time. He appears well-developed and well-nourished.  HENT:  Head: Normocephalic and atraumatic.  Eyes: EOM are normal. Pupils are equal, round, and reactive to light.  Neck: No tracheal  deviation present. No thyromegaly present.  Cardiovascular: Normal rate.  Pulmonary/Chest: Effort normal. He has no wheezes.  Abdominal: Soft. Bowel sounds are normal.  Neurological: He is alert and oriented to person, place, and time.  Skin: Skin is warm and dry. Capillary refill takes less than 2 seconds.  Psychiatric: He has a normal mood and affect. His behavior is normal. Judgment and thought content normal.    Ortho Exam some pain with straight leg raising on the right at 80 degrees.  Palpable distal pulse.  Crepitus with knee range of motion he is using more medial than lateral joint line tenderness.  Mild varus deformity of his right knee.  Jerk are intact anterior tib gastrocsoleus EHL is intact.  Specialty Comments:  No specialty comments available.  Imaging: No results found.   PMFS History: Patient Active Problem List   Diagnosis Date Noted  . Unilateral primary osteoarthritis, right knee 03/05/2017  . Chronic right-sided low back pain with right-sided sciatica 01/01/2017  . Hypercholesteremia 03/11/2016  . Class 1 obesity due to excess calories with serious comorbidity and body mass index (BMI) of 34.0 to 34.9 in adult 03/11/2016  . ANEMIA 12/31/2009  . RECTAL BLEEDING 12/31/2009  . DM type 2 causing vascular disease ([place]) 04/12/2007  . DEGENERATIVE JOINT DISEASE, RIGHT KNEE 04/12/2007  . KNEE PAIN 04/12/2007  . Essential hypertension, benign  04/12/2007   Past Medical History:  Diagnosis Date  . Asthmatic bronchitis   . Coronary atherosclerosis of native coronary artery   . DDD (degenerative disc disease), lumbar   . Diabetes ([place])   . Gastroesophageal reflux disease   . Hypertension   . Insomnia, unspecified   . Obstructive sleep apnea   . Prostate cancer ([place])   . Rhinitis, allergic     Family History  Problem Relation Age of Onset  . Cancer Sister     Past Surgical History:  Procedure Laterality Date  . CARDIAC CATHETERIZATION  2015  . OTHER  SURGICAL HISTORY  1983   R KNEE ARTHORSCOPY   Social History   Occupational History  . Not on file  Tobacco Use  . Smoking status: Former Smoker    Last attempt to quit: 09/01/1982    Years since quitting: 34.8  . Smokeless tobacco: Never Used  Substance and Sexual Activity  . Alcohol use: No  . Drug use: No  . Sexual activity: Yes    Partners: Female

## 2017-07-04 ENCOUNTER — Encounter: Payer: Self-pay | Admitting: Cardiology

## 2017-07-16 ENCOUNTER — Encounter (INDEPENDENT_AMBULATORY_CARE_PROVIDER_SITE_OTHER): Payer: Self-pay | Admitting: Physical Medicine and Rehabilitation

## 2017-07-16 ENCOUNTER — Ambulatory Visit (INDEPENDENT_AMBULATORY_CARE_PROVIDER_SITE_OTHER): Payer: Self-pay

## 2017-07-16 ENCOUNTER — Ambulatory Visit (INDEPENDENT_AMBULATORY_CARE_PROVIDER_SITE_OTHER): Payer: Medicare Other | Admitting: Physical Medicine and Rehabilitation

## 2017-07-16 VITALS — BP 126/68 | HR 60 | Temp 98.0°F

## 2017-07-16 DIAGNOSIS — M5416 Radiculopathy, lumbar region: Secondary | ICD-10-CM

## 2017-07-16 MED ORDER — BETAMETHASONE SOD PHOS & ACET 6 (3-3) MG/ML IJ SUSP
12.0000 mg | Freq: Once | INTRAMUSCULAR | Status: AC
  Administered 2017-07-16: 12 mg

## 2017-07-16 NOTE — Patient Instructions (Signed)

## 2017-07-16 NOTE — Progress Notes (Deleted)
Pt states a sharp pain in lower back that radiates down both right and left leg mostly the right leg. Pt states pain started several months ago. Pt states working hard and sitting makes pain worse, heating pad and rest makes pain better. +Driver, -BT, -Dye Allergies.

## 2017-07-20 ENCOUNTER — Encounter (INDEPENDENT_AMBULATORY_CARE_PROVIDER_SITE_OTHER): Payer: Self-pay | Admitting: Orthopaedic Surgery

## 2017-07-24 NOTE — Procedures (Signed)
Lumbar Epidural Steroid Injection - Interlaminar Approach with Fluoroscopic Guidance  Patient: Johnny Huber      Date of Birth: [birth year] MRN: [id number] PCP: Bridget Hartshorn, NP      Visit Date: 07/16/2017   Universal Protocol:     Consent Given By: the patient  Position: PRONE  Additional Comments: Vital signs were monitored before and after the procedure. Patient was prepped and draped in the usual sterile fashion. The correct patient, procedure, and site was verified.   Injection Procedure Details:  Procedure Site One Meds Administered:  Meds ordered this encounter  Medications  . betamethasone acetate-betamethasone sodium phosphate (CELESTONE) injection 12 mg     Laterality: Right  Location/Site:  L5-S1  Needle size: 20 G  Needle type: Tuohy  Needle Placement: Paramedian epidural  Findings:   -Comments: Excellent flow of contrast into the epidural space.  Procedure Details: Using a paramedian approach from the side mentioned above, the region overlying the inferior lamina was localized under fluoroscopic visualization and the soft tissues overlying this structure were infiltrated with 4 ml. of 1% Lidocaine without Epinephrine. The Tuohy needle was inserted into the epidural space using a paramedian approach.   The epidural space was localized using loss of resistance along with lateral and bi-planar fluoroscopic views.  After negative aspirate for air, blood, and CSF, a 2 ml. volume of Isovue-250 was injected into the epidural space and the flow of contrast was observed. Radiographs were obtained for documentation purposes.    The injectate was administered into the level noted above.   Additional Comments:  The patient tolerated the procedure well Dressing: Band-Aid    Post-procedure details: Patient was observed during the procedure. Post-procedure instructions were reviewed.  Patient left the clinic in stable condition.

## 2017-07-24 NOTE — Progress Notes (Signed)
Johnny Huber - 72 y.o. male MRN [id number]  Date of birth: [birth year]  Office Visit Note: Visit Date: 07/16/2017 PCP: Bridget Hartshorn, NP Referred by: Bridget Hartshorn, NP  Subjective: Chief Complaint  Patient presents with  . Lower Back - Pain  . Right Leg - Pain  . Left Leg - Pain   HPI: Johnny Huber is a 72 year old gentleman who comes in today at the request of Dr. Lorin Mercy for right L5-S1 interlaminar epidural steroid injection for right hip and leg pain.  He also has left-sided symptoms as well but more on the right.  MRI findings show small disc protrusions and broad-based protrusion without significant stenosis or significant facet arthropathy.  Patient reports pain for several months without specific injury.  He reports that working hard and sitting makes the pain worse while heating pads and rest make it somewhat better.  He has had no prior lumbar surgery.  The injection  will be diagnostic and hopefully therapeutic. The patient has failed conservative care including time, medications and activity modification.    ROS Otherwise per HPI.  Assessment & Plan: Visit Diagnoses:  1. Lumbar radiculopathy     Plan: No additional findings.   Meds & Orders:  Meds ordered this encounter  Medications  . betamethasone acetate-betamethasone sodium phosphate (CELESTONE) injection 12 mg    Orders Placed This Encounter  Procedures  . XR C-ARM NO REPORT  . Epidural Steroid injection    Follow-up: Return if symptoms worsen or fail to improve.   Procedures: No procedures performed  Lumbar Epidural Steroid Injection - Interlaminar Approach with Fluoroscopic Guidance  Patient: Johnny Huber      Date of Birth: 22-Apr-1946 MRN: [id number] PCP: Bridget Hartshorn, NP      Visit Date: 07/16/2017   Universal Protocol:     Consent Given By: the patient  Position: PRONE  Additional Comments: Vital signs were monitored before and after the procedure. Patient was  prepped and draped in the usual sterile fashion. The correct patient, procedure, and site was verified.   Injection Procedure Details:  Procedure Site One Meds Administered:  Meds ordered this encounter  Medications  . betamethasone acetate-betamethasone sodium phosphate (CELESTONE) injection 12 mg     Laterality: Right  Location/Site:  L5-S1  Needle size: 20 G  Needle type: Tuohy  Needle Placement: Paramedian epidural  Findings:   -Comments: Excellent flow of contrast into the epidural space.  Procedure Details: Using a paramedian approach from the side mentioned above, the region overlying the inferior lamina was localized under fluoroscopic visualization and the soft tissues overlying this structure were infiltrated with 4 ml. of 1% Lidocaine without Epinephrine. The Tuohy needle was inserted into the epidural space using a paramedian approach.   The epidural space was localized using loss of resistance along with lateral and bi-planar fluoroscopic views.  After negative aspirate for air, blood, and CSF, a 2 ml. volume of Isovue-250 was injected into the epidural space and the flow of contrast was observed. Radiographs were obtained for documentation purposes.    The injectate was administered into the level noted above.   Additional Comments:  The patient tolerated the procedure well Dressing: Band-Aid    Post-procedure details: Patient was observed during the procedure. Post-procedure instructions were reviewed.  Patient left the clinic in stable condition.   Clinical History: MRI LUMBAR SPINE WITHOUT CONTRAST  TECHNIQUE: Multiplanar, multisequence MR imaging of the lumbar spine was performed. No intravenous contrast was administered.  COMPARISON:  Radiographs dated 08/13/2015  FINDINGS: Segmentation: Standard.  Alignment: Physiologic.  Vertebrae: No fracture, evidence of discitis, or bone lesion.  Conus medullaris: Extends to the L1-2 level and  appears normal.  Paraspinal and other soft tissues: Negative.  Disc levels:  L1-2: Disc desiccation. Otherwise normal.  L2-3: Small broad-based disc bulge without neural impingement. Otherwise normal.  L3-4: Slight disc desiccation. Otherwise normal.  L4-5: Slight broad-based disc bulge most prominent centrally without neural impingement. Otherwise normal.  L5-S1: Chronic disc space narrowing with a broad-based small disc protrusion with accompanying osteophytes without neural impingement. No foraminal stenosis. No facet arthritis.  IMPRESSION: 1. Chronic degenerative disc disease at L5-S1 with a small broad-based disc protrusion with accompanying osteophytes without neural impingement. 2. Multilevel slight degenerative disc disease without neural impingement.   Electronically Signed By: Lorriane Shire M.D. On: 12/03/2016 10:13  He reports that he quit smoking about 34 years ago. he has never used smokeless tobacco.  Recent Labs    09/23/16 0904  HGBA1C 8.3*    Objective:  VS:  HT:    WT:   BMI:     BP:126/68  HR:60bpm  TEMP:98 F (36.7 C)(Oral)  RESP:93 % Physical Exam  Ortho Exam Imaging: No results found.  Past Medical/Family/Surgical/Social History: Medications & Allergies reviewed per EMR Patient Active Problem List   Diagnosis Date Noted  . Unilateral primary osteoarthritis, right knee 03/05/2017  . Chronic right-sided low back pain with right-sided sciatica 01/01/2017  . Hypercholesteremia 03/11/2016  . Class 1 obesity due to excess calories with serious comorbidity and body mass index (BMI) of 34.0 to 34.9 in adult 03/11/2016  . ANEMIA 12/31/2009  . RECTAL BLEEDING 12/31/2009  . DM type 2 causing vascular disease ([place]) 04/12/2007  . DEGENERATIVE JOINT DISEASE, RIGHT KNEE 04/12/2007  . KNEE PAIN 04/12/2007  . Essential hypertension, benign 04/12/2007   Past Medical History:  Diagnosis Date  . Asthmatic bronchitis   . Coronary atherosclerosis  of native coronary artery   . DDD (degenerative disc disease), lumbar   . Diabetes ([place])   . Gastroesophageal reflux disease   . Hypertension   . Insomnia, unspecified   . Obstructive sleep apnea   . Prostate cancer ([place])   . Rhinitis, allergic    Family History  Problem Relation Age of Onset  . Cancer Sister    Past Surgical History:  Procedure Laterality Date  . CARDIAC CATHETERIZATION  2015  . OTHER SURGICAL HISTORY  1983   R KNEE ARTHORSCOPY   Social History   Occupational History  . Not on file  Tobacco Use  . Smoking status: Former Smoker    Last attempt to quit: 09/01/1982    Years since quitting: 34.9  . Smokeless tobacco: Never Used  Substance and Sexual Activity  . Alcohol use: No  . Drug use: No  . Sexual activity: Yes    Partners: Female

## 2017-09-18 ENCOUNTER — Telehealth (INDEPENDENT_AMBULATORY_CARE_PROVIDER_SITE_OTHER): Payer: Self-pay | Admitting: Orthopaedic Surgery

## 2017-09-18 DIAGNOSIS — M5441 Lumbago with sciatica, right side: Principal | ICD-10-CM

## 2017-09-18 DIAGNOSIS — G8929 Other chronic pain: Secondary | ICD-10-CM

## 2017-09-18 NOTE — Telephone Encounter (Signed)
Patient's wife [name] calling to set up back surgery for patient. She mentions that he has spurs on his spine and his discomfort has increased. He was last seen in the [place] office in February of this year.  He is under the care of Dr Carlyle Dolly for Cardiology and is being treated by [facility], ANP w/[facility] for his diabetes.  I don't have a surgery sheet yet.

## 2017-09-18 NOTE — Telephone Encounter (Signed)
Please advise 

## 2017-09-18 NOTE — Telephone Encounter (Signed)
I called and discussed with patient's wife.  She states he got 30 to 40% better with the injection and after a week or so the pain came back.  She states he is ready for surgery.  MRI scan showed broad-based disc bulge without significant nerve compression.  I recommend he see Dr. Ernestina Patches one more time for an injection and then follow-up with me 2 weeks after the injection to see how he is doing.  I reviewed with her again MRI results.  Please seek approval for second epidural injection and schedule him for an office visit with me 2 weeks after the injection.  Thank you

## 2017-09-18 NOTE — Telephone Encounter (Signed)
Please see message below from Dr. Lorin Mercy. I entered order for repeat ESI.  Thanks.

## 2017-09-21 NOTE — Telephone Encounter (Signed)
I think I answered this on another message? Could have been the referral?

## 2017-09-21 NOTE — Telephone Encounter (Signed)
See message below from Dr. Lorin Mercy. Please Advise.

## 2017-10-07 ENCOUNTER — Encounter (INDEPENDENT_AMBULATORY_CARE_PROVIDER_SITE_OTHER): Payer: Self-pay | Admitting: Physical Medicine and Rehabilitation

## 2017-10-07 ENCOUNTER — Ambulatory Visit (INDEPENDENT_AMBULATORY_CARE_PROVIDER_SITE_OTHER): Payer: Medicare Other | Admitting: Physical Medicine and Rehabilitation

## 2017-10-07 ENCOUNTER — Ambulatory Visit (INDEPENDENT_AMBULATORY_CARE_PROVIDER_SITE_OTHER): Payer: Self-pay

## 2017-10-07 VITALS — BP 124/69 | HR 55

## 2017-10-07 DIAGNOSIS — M5416 Radiculopathy, lumbar region: Secondary | ICD-10-CM

## 2017-10-07 MED ORDER — METHYLPREDNISOLONE ACETATE 80 MG/ML IJ SUSP
80.0000 mg | Freq: Once | INTRAMUSCULAR | Status: AC
Start: 2017-10-07 — End: 2017-10-07
  Administered 2017-10-07: 80 mg

## 2017-10-07 NOTE — Progress Notes (Signed)
 .  Numeric Pain Rating Scale and Functional Assessment Average Pain 6   In the last MONTH (on 0-10 scale) has pain interfered with the following?  1. General activity like being  able to carry out your everyday physical activities such as walking, climbing stairs, carrying groceries, or moving a chair?  Rating(4)   +Driver, -BT, -Dye Allergies.  

## 2017-10-07 NOTE — Patient Instructions (Signed)

## 2017-10-21 ENCOUNTER — Ambulatory Visit (INDEPENDENT_AMBULATORY_CARE_PROVIDER_SITE_OTHER): Payer: Medicare Other | Admitting: Orthopaedic Surgery

## 2017-10-21 ENCOUNTER — Encounter (INDEPENDENT_AMBULATORY_CARE_PROVIDER_SITE_OTHER): Payer: Self-pay | Admitting: Orthopaedic Surgery

## 2017-10-21 VITALS — BP 118/68 | HR 57 | Ht 71.0 in | Wt 240.0 lb

## 2017-10-21 DIAGNOSIS — M1711 Unilateral primary osteoarthritis, right knee: Secondary | ICD-10-CM | POA: Diagnosis not present

## 2017-10-21 DIAGNOSIS — I2510 Atherosclerotic heart disease of native coronary artery without angina pectoris: Secondary | ICD-10-CM

## 2017-10-21 DIAGNOSIS — M5441 Lumbago with sciatica, right side: Secondary | ICD-10-CM | POA: Diagnosis not present

## 2017-10-21 DIAGNOSIS — G8929 Other chronic pain: Secondary | ICD-10-CM

## 2017-10-21 NOTE — Progress Notes (Signed)
Office Visit Note   Patient: Johnny Huber           Date of Birth: [birth year]           MRN: [id number] Visit Date: 10/21/2017              Requested by: Bridget Hartshorn, NP [address] [place], [place] [id number] PCP: Bridget Hartshorn, NP   Assessment & Plan: Visit Diagnoses:  1. Unilateral primary osteoarthritis, right knee   2. Chronic right-sided low back pain with right-sided sciatica     Plan: Patient like to proceed with total knee arthroplasty in the right knee we discussed intraoperative technique to night stay in the hospital average.  Postoperative therapy at his house for a few weeks and then outpatient therapy for further strengthening.  We discussed the importance of working on his range of motion with therapy to prevent a stiff painful knee.  We discussed working on some weight loss.  His knee pain and arthritis is limited his walking ability and he feels it once his knee is better he can walk more which will help him lose weight.  Risk surgery discussed.  Decision for surgery made today questions were elicited and answered.  Patient's wife was with him today and included in the discussion.  Preoperative medical clearance will be obtained with his history of diabetes.  Patient's wife states last A1c was 7.5.  Follow-Up Instructions: No follow-ups on file.   Orders:  No orders of the defined types were placed in this encounter.  No orders of the defined types were placed in this encounter.     Procedures: No procedures performed   Clinical Data: No additional findings.   Subjective: Chief Complaint  Patient presents with  . Lower Back - Follow-up, Pain    HPI 71 year old male returns post second epidural at L5-S1.  He has primarily right leg pain and has disc degeneration at L5-S1 with small disc protrusion without neural impingement.  He has significant loss of disc space height at L5-S1.  Other levels show some mild facet degenerative  changes and he has some anterior spurring at several levels which are prominent.  Denies fever chills or fever no bowel or bladder symptoms associated. Patient's other problem is right knee primary osteoarthritis.  Is taken anti-inflammatories had intra-articular injections without sustained relief.  Patient states he is ready to proceed with total knee arthroplasty of the right knee.  Patient relates the second epidural for his lumbar spine did better than the first.  He states he is got greater than 50% relief of his back and right leg pain from the last epidural at L5-S1 on 10/07/2017. Review of Systems 14 point review of systems updated unchanged from last office visit with me on 07/02/2017.  Of note is type 2 diabetes, right knee osteoarthritis.  BMI 33.  History of anemia.   Objective: Vital Signs: BP 118/68   Pulse (!) 57   Ht 5\' 11"  (1.803 m)   Wt 240 lb (108.9 kg)   BMI 33.47 kg/m   Physical Exam  Constitutional: He is oriented to person, place, and time. He appears well-developed and well-nourished.  HENT:  Head: Normocephalic and atraumatic.  Eyes: Pupils are equal, round, and reactive to light. EOM are normal.  Neck: No tracheal deviation present. No thyromegaly present.  Cardiovascular: Normal rate.  Pulmonary/Chest: Effort normal. He has no wheezes.  Abdominal: Soft. Bowel sounds are normal.  Neurological: He is alert and oriented  to person, place, and time.  Skin: Skin is warm and dry. Capillary refill takes less than 2 seconds.  Psychiatric: He has a normal mood and affect. His behavior is normal. Judgment and thought content normal.    Ortho Exam patient has right knee range of motion of 5 to 120 degrees.  Collateral ligaments are stable he has crepitus with knee range of motion.  More medial joint line tenderness with palpable osteophytes.  ACL PCL exam is normal.  Intact pedal pulses.  No pitting edema no venous stasis changes.  Normal heel toe gait.   Specialty  Comments:  No specialty comments available.  Imaging: Standing AP x-rays both knee shows medial joint line narrowing marginal osteophytes medially flattening of the femoral condyle and subchondral sclerosis consistent with tricompartmental degenerative arthritis worse in the medial compartment than lateral and patellofemoral.   PMFS History: Patient Active Problem List   Diagnosis Date Noted  . Unilateral primary osteoarthritis, right knee 03/05/2017  . Chronic right-sided low back pain with right-sided sciatica 01/01/2017  . Hypercholesteremia 03/11/2016  . Class 1 obesity due to excess calories with serious comorbidity and body mass index (BMI) of 34.0 to 34.9 in adult 03/11/2016  . ANEMIA 12/31/2009  . RECTAL BLEEDING 12/31/2009  . DM type 2 causing vascular disease ([place]) 04/12/2007  . DEGENERATIVE JOINT DISEASE, RIGHT KNEE 04/12/2007  . KNEE PAIN 04/12/2007  . Essential hypertension, benign 04/12/2007   Past Medical History:  Diagnosis Date  . Asthmatic bronchitis   . Coronary atherosclerosis of native coronary artery   . DDD (degenerative disc disease), lumbar   . Diabetes ([place])   . Gastroesophageal reflux disease   . Hypertension   . Insomnia, unspecified   . Obstructive sleep apnea   . Prostate cancer ([place])   . Rhinitis, allergic     Family History  Problem Relation Age of Onset  . Cancer Sister     Past Surgical History:  Procedure Laterality Date  . CARDIAC CATHETERIZATION  2015  . OTHER SURGICAL HISTORY  1983   R KNEE ARTHORSCOPY   Social History   Occupational History  . Not on file  Tobacco Use  . Smoking status: Former Smoker    Last attempt to quit: 09/01/1982    Years since quitting: 35.1  . Smokeless tobacco: Never Used  Substance and Sexual Activity  . Alcohol use: No  . Drug use: No  . Sexual activity: Yes    Partners: Female

## 2017-10-22 NOTE — Procedures (Signed)
Lumbar Epidural Steroid Injection - Interlaminar Approach with Fluoroscopic Guidance  Patient: Johnny Huber      Date of Birth: 1946-05-13 MRN: [id number] PCP: Bridget Hartshorn, NP      Visit Date: 10/07/2017   Universal Protocol:     Consent Given By: the patient  Position: PRONE  Additional Comments: Vital signs were monitored before and after the procedure. Patient was prepped and draped in the usual sterile fashion. The correct patient, procedure, and site was verified.   Injection Procedure Details:  Procedure Site One Meds Administered:  Meds ordered this encounter  Medications  . methylPREDNISolone acetate (DEPO-MEDROL) injection 80 mg     Laterality: Right  Location/Site:  L5-S1  Needle size: 20 G  Needle type: Tuohy  Needle Placement: Paramedian epidural  Findings:   -Comments: Excellent flow of contrast into the epidural space.  Procedure Details: Using a paramedian approach from the side mentioned above, the region overlying the inferior lamina was localized under fluoroscopic visualization and the soft tissues overlying this structure were infiltrated with 4 ml. of 1% Lidocaine without Epinephrine. The Tuohy needle was inserted into the epidural space using a paramedian approach.   The epidural space was localized using loss of resistance along with lateral and bi-planar fluoroscopic views.  After negative aspirate for air, blood, and CSF, a 2 ml. volume of Isovue-250 was injected into the epidural space and the flow of contrast was observed. Radiographs were obtained for documentation purposes.    The injectate was administered into the level noted above.   Additional Comments:  The patient tolerated the procedure well Dressing: Band-Aid    Post-procedure details: Patient was observed during the procedure. Post-procedure instructions were reviewed.  Patient left the clinic in stable condition.

## 2017-10-22 NOTE — Progress Notes (Signed)
[name] - 72 y.o. male MRN [id number]  Date of birth: 16-Jan-1946  Office Visit Note: Visit Date: 10/07/2017 PCP: Bridget Hartshorn, NP Referred by: Bridget Hartshorn, NP  Subjective: Chief Complaint  Patient presents with  . Lower Back - Pain  . Right Leg - Pain   HPI: Mr. Johnny Huber is a 72 year old gentleman with chronic worsening low back right hip and leg pain and or radicular fashion.  MRI shows small disc protrusion at L5-S1 with degenerative changes.  He has had ongoing pain for more than a year.  This is worse with standing and walking.  Dr. Lorin Mercy request diagnostic and therapeutic right L5-S1 interlaminar epidural steroid injection.   ROS Otherwise per HPI.  Assessment & Plan: Visit Diagnoses:  1. Lumbar radiculopathy     Plan: No additional findings.   Meds & Orders:  Meds ordered this encounter  Medications  . methylPREDNISolone acetate (DEPO-MEDROL) injection 80 mg    Orders Placed This Encounter  Procedures  . XR C-ARM NO REPORT  . Epidural Steroid injection    Follow-up: Return if symptoms worsen or fail to improve.   Procedures: No procedures performed  Lumbar Epidural Steroid Injection - Interlaminar Approach with Fluoroscopic Guidance  Patient: Johnny Huber      Date of Birth: 17-Aug-1945 MRN: [id number] PCP: Bridget Hartshorn, NP      Visit Date: 10/07/2017   Universal Protocol:     Consent Given By: the patient  Position: PRONE  Additional Comments: Vital signs were monitored before and after the procedure. Patient was prepped and draped in the usual sterile fashion. The correct patient, procedure, and site was verified.   Injection Procedure Details:  Procedure Site One Meds Administered:  Meds ordered this encounter  Medications  . methylPREDNISolone acetate (DEPO-MEDROL) injection 80 mg     Laterality: Right  Location/Site:  L5-S1  Needle size: 20 G  Needle type: Tuohy  Needle Placement: Paramedian  epidural  Findings:   -Comments: Excellent flow of contrast into the epidural space.  Procedure Details: Using a paramedian approach from the side mentioned above, the region overlying the inferior lamina was localized under fluoroscopic visualization and the soft tissues overlying this structure were infiltrated with 4 ml. of 1% Lidocaine without Epinephrine. The Tuohy needle was inserted into the epidural space using a paramedian approach.   The epidural space was localized using loss of resistance along with lateral and bi-planar fluoroscopic views.  After negative aspirate for air, blood, and CSF, a 2 ml. volume of Isovue-250 was injected into the epidural space and the flow of contrast was observed. Radiographs were obtained for documentation purposes.    The injectate was administered into the level noted above.   Additional Comments:  The patient tolerated the procedure well Dressing: Band-Aid    Post-procedure details: Patient was observed during the procedure. Post-procedure instructions were reviewed.  Patient left the clinic in stable condition.   Clinical History: MRI LUMBAR SPINE WITHOUT CONTRAST  TECHNIQUE: Multiplanar, multisequence MR imaging of the lumbar spine was performed. No intravenous contrast was administered.  COMPARISON: Radiographs dated 08/13/2015  FINDINGS: Segmentation: Standard.  Alignment: Physiologic.  Vertebrae: No fracture, evidence of discitis, or bone lesion.  Conus medullaris: Extends to the L1-2 level and appears normal.  Paraspinal and other soft tissues: Negative.  Disc levels:  L1-2: Disc desiccation. Otherwise normal.  L2-3: Small broad-based disc bulge without neural impingement. Otherwise normal.  L3-4: Slight disc desiccation. Otherwise normal.  L4-5: Slight  broad-based disc bulge most prominent centrally without neural impingement. Otherwise normal.  L5-S1: Chronic disc space narrowing with a broad-based small  disc protrusion with accompanying osteophytes without neural impingement. No foraminal stenosis. No facet arthritis.  IMPRESSION: 1. Chronic degenerative disc disease at L5-S1 with a small broad-based disc protrusion with accompanying osteophytes without neural impingement. 2. Multilevel slight degenerative disc disease without neural impingement.   Electronically Signed By: Lorriane Shire M.D. On: 12/03/2016 10:13   He reports that he quit smoking about 35 years ago. He has never used smokeless tobacco. No results for input(s): HGBA1C, LABURIC in the last 8760 hours.  Objective:  VS:  HT:    WT:   BMI:     BP:124/69  HR:(!) 55bpm  TEMP: ( )  RESP:  Physical Exam  Ortho Exam Imaging: No results found.  Past Medical/Family/Surgical/Social History: Medications & Allergies reviewed per EMR, new medications updated. Patient Active Problem List   Diagnosis Date Noted  . Unilateral primary osteoarthritis, right knee 03/05/2017  . Chronic right-sided low back pain with right-sided sciatica 01/01/2017  . Hypercholesteremia 03/11/2016  . Class 1 obesity due to excess calories with serious comorbidity and body mass index (BMI) of 34.0 to 34.9 in adult 03/11/2016  . ANEMIA 12/31/2009  . RECTAL BLEEDING 12/31/2009  . DM type 2 causing vascular disease ([place]) 04/12/2007  . DEGENERATIVE JOINT DISEASE, RIGHT KNEE 04/12/2007  . KNEE PAIN 04/12/2007  . Essential hypertension, benign 04/12/2007   Past Medical History:  Diagnosis Date  . Asthmatic bronchitis   . Coronary atherosclerosis of native coronary artery   . DDD (degenerative disc disease), lumbar   . Diabetes ([place])   . Gastroesophageal reflux disease   . Hypertension   . Insomnia, unspecified   . Obstructive sleep apnea   . Prostate cancer ([place])   . Rhinitis, allergic    Family History  Problem Relation Age of Onset  . Cancer Sister    Past Surgical History:  Procedure Laterality Date  . CARDIAC CATHETERIZATION   2015  . OTHER SURGICAL HISTORY  1983   R KNEE ARTHORSCOPY   Social History   Occupational History  . Not on file  Tobacco Use  . Smoking status: Former Smoker    Last attempt to quit: 09/01/1982    Years since quitting: 35.1  . Smokeless tobacco: Never Used  Substance and Sexual Activity  . Alcohol use: No  . Drug use: No  . Sexual activity: Yes    Partners: Female

## 2017-11-09 ENCOUNTER — Telehealth (INDEPENDENT_AMBULATORY_CARE_PROVIDER_SITE_OTHER): Payer: Self-pay | Admitting: Orthopaedic Surgery

## 2017-11-09 NOTE — Telephone Encounter (Signed)
Patients wife called advising A1c is now 6.8 and needs to schedule surgery with Dr. Lorin Mercy for her husband. Please call when you have time # [phone]

## 2017-11-18 ENCOUNTER — Telehealth (INDEPENDENT_AMBULATORY_CARE_PROVIDER_SITE_OTHER): Payer: Self-pay | Admitting: Orthopaedic Surgery

## 2017-11-18 NOTE — Telephone Encounter (Signed)
Called patient to let him know that the clearance from Dr. Edrick Oh was received and we can move forward with right total knee surgery.  Spoke with [name], patient's wife.  She wants to discuss the surgery to remove the spurs on the spine because of his quality of life he is experiencing and being in pain.  She states the pain has worsened. "Knee first or back first" is what she is asking. I did explain that I had only received a surgery sheet for the total knee and would send Dr. Lorin Mercy a message conveying her concern for proceeding with the knee surgery when the back pain is getting worse.

## 2017-11-19 NOTE — Telephone Encounter (Signed)
I tried to call, cannot get thru, get busy signal on all numbers we have for both of them. Do knee first then can see about fixing back after he rehabs his knee. ucall thanks

## 2017-11-19 NOTE — Telephone Encounter (Signed)
Please advise 

## 2017-11-19 NOTE — Telephone Encounter (Signed)
We are unable to reach Mr. Falck from the phones here in [place].  Could you please try he or his wife from [facility] and see if you can reach them to advise of below? Thanks.

## 2017-11-20 ENCOUNTER — Other Ambulatory Visit (INDEPENDENT_AMBULATORY_CARE_PROVIDER_SITE_OTHER): Payer: Self-pay

## 2017-11-25 ENCOUNTER — Ambulatory Visit (INDEPENDENT_AMBULATORY_CARE_PROVIDER_SITE_OTHER): Payer: Medicare Other | Admitting: Surgery

## 2017-11-25 VITALS — BP 105/64 | HR 66 | Temp 97.7°F | Resp 16 | Ht 71.0 in | Wt 240.0 lb

## 2017-11-25 DIAGNOSIS — I2510 Atherosclerotic heart disease of native coronary artery without angina pectoris: Secondary | ICD-10-CM | POA: Diagnosis not present

## 2017-11-25 DIAGNOSIS — M1711 Unilateral primary osteoarthritis, right knee: Secondary | ICD-10-CM

## 2017-11-25 DIAGNOSIS — M25561 Pain in right knee: Secondary | ICD-10-CM

## 2017-11-25 DIAGNOSIS — G8929 Other chronic pain: Secondary | ICD-10-CM | POA: Diagnosis not present

## 2017-11-25 NOTE — Progress Notes (Signed)
72 year old white male with history of end-stage DJD right knee and chronic pain presents for preop evaluation for total knee replacement.  We received preop medical clearance.  We will get preop clearance by Dr. Harl Bowie.  Patient was last seen by him February 2019 and is scheduled for return office visit August 2019.  History of coronary artery disease and is currently on Effient.

## 2017-11-25 NOTE — Pre-Procedure Instructions (Addendum)
Johnny Huber  11/25/2017    Your procedure is scheduled on Wednesday, December 09, 2017 at 12:30 PM.   Report to [facility] Entrance "A" Admitting Office at 10:30 AM.   Call this number if you have problems the morning of surgery: [phone]   Questions prior to day of surgery, please call [phone] between 8 & 4 PM.   Remember:  Do not eat or drink after midnight Tuesday, 12/08/17.  Take these medicines the morning of surgery with A SIP OF WATER: Amlodipine (Norvasc), Cetirizine (Zyrtec), Citalopram (Celexa), Ropinirole (Requip), Nasonex,  Albuterol inhaler - if needed (bring inhaler with you day of surgery.   Stop Aspirin as instructed by your surgeon or physician. Do not use NSAIDS (Ibuprofen, Aleve, etc) or any other Aspirin products 7 days prior to surgery.  Morning of surgery, take 1/2 of your regular dose of Tresiba Insulin - you will take only 10 units  Morning of surgery, do NOT take your Januvia (Sitagliptin) and Metformin (Glucophage).    How to Manage Your Diabetes Before Surgery   Why is it important to control my blood sugar before and after surgery?   Improving blood sugar levels before and after surgery helps healing and can limit problems.  A way of improving blood sugar control is eating a healthy diet by:  - Eating less sugar and carbohydrates  - Increasing activity/exercise  - Talk with your doctor about reaching your blood sugar goals  High blood sugars (greater than 180 mg/dL) can raise your risk of infections and slow down your recovery so you will need to focus on controlling your diabetes during the weeks before surgery.  Make sure that the doctor who takes care of your diabetes knows about your planned surgery including the date and location.  How do I manage my blood sugars before surgery?   Check your blood sugar at least 4 times a day, 2 days before surgery to make sure that they are not too high or low.  Check your blood sugar  the morning of your surgery when you wake up and every 2 hours until you get to the Short-Stay unit.  Treat a low blood sugar (less than 70 mg/dL) with 1/2 cup of clear juice (cranberry or apple), 4 glucose tablets, OR glucose gel.  Recheck blood sugar in 15 minutes after treatment (to make sure it is greater than 70 mg/dL).  If blood sugar is not greater than 70 mg/dL on re-check, call [phone] for further instructions.   Report your blood sugar to the Short-Stay nurse when you get to Short-Stay.  References:  [facility], 2007 "How to Manage your Diabetes Before and After Surgery".   Do not wear jewelry.  Do not wear lotions, powders, cologne or deodorant.  Men may shave face and neck.  Do not bring valuables to the hospital.  [facility] is not responsible for any belongings or valuables.  Contacts, dentures or bridgework may not be worn into surgery.  Leave your suitcase in the car.  After surgery it may be brought to your room.  For patients admitted to the hospital, discharge time will be determined by your treatment team.  [facility] - Preparing for Surgery  Before surgery, you can play an important role.  Because skin is not sterile, your skin needs to be as free of germs as possible.  You can reduce the number of germs on you skin by washing with CHG (chlorahexidine gluconate) soap  before surgery.  CHG is an antiseptic cleaner which kills germs and bonds with the skin to continue killing germs even after washing.  Oral Hygiene is also important in reducing the risk of infection.  Remember to brush your teeth with your regular toothpaste the morning of surgery.  Please DO NOT use if you have an allergy to CHG or antibacterial soaps.  If your skin becomes reddened/irritated stop using the CHG and inform your nurse when you arrive at Short Stay.  Do not shave (including legs and underarms) for at least 48 hours prior to the first CHG shower.  You may  shave your face.  Please follow these instructions carefully:   1.  Shower with CHG Soap the night before surgery and the morning of Surgery.  2.  If you choose to wash your hair, wash your hair first as usual with your normal shampoo.  3.  After you shampoo, rinse your hair and body thoroughly to remove the shampoo. 4.  Use CHG as you would any other liquid soap.  You can apply chg directly to the skin and wash gently with a      scrungie or washcloth.           5.  Apply the CHG Soap to your body ONLY FROM THE NECK DOWN.   Do not use on open wounds or open sores. Avoid contact with your eyes, ears, mouth and genitals (private parts).  Wash genitals (private parts) with your normal soap.  6.  Wash thoroughly, paying special attention to the area where your surgery will be performed.  7.  Thoroughly rinse your body with warm water from the neck down.  8.  DO NOT shower/wash with your normal soap after using and rinsing off the CHG Soap.  9.  Pat yourself dry with a clean towel.            10.  Wear clean pajamas.            11.  Place clean sheets on your bed the night of your first shower and do not sleep with pets.  Day of Surgery  Shower as above. Do not apply any lotions/deodorants the morning of surgery.   Please wear clean clothes to the hospital. Remember to brush your teeth with toothpaste.   Please read over the fact sheets that you were given.

## 2017-11-25 NOTE — H&P (Addendum)
TOTAL KNEE ADMISSION H&P  Patient is being admitted for right total knee arthroplasty.  Subjective:  Chief Complaint:right knee pain.  HPI: Johnny Huber, 72 y.o. male, has a history of pain and functional disability in the right knee due to arthritis and has failed non-surgical conservative treatments for greater than 12 weeks to includeNSAID's and/or analgesics, corticosteriod injections, use of assistive devices and activity modification.  Onset of symptoms was gradual, starting 10 years ago with gradually worsening course since that time.   Patient currently rates pain in the right knee(s) at 10 out of 10 with activity. Patient has night pain, worsening of pain with activity and weight bearing, crepitus and joint swelling.  Patient has evidence of subchondral cysts, subchondral sclerosis, periarticular osteophytes and joint space narrowing by imaging studies.  There is no active infection.  Patient Active Problem List   Diagnosis Date Noted  . Unilateral primary osteoarthritis, right knee 03/05/2017  . Chronic right-sided low back pain with right-sided sciatica 01/01/2017  . Hypercholesteremia 03/11/2016  . Class 1 obesity due to excess calories with serious comorbidity and body mass index (BMI) of 34.0 to 34.9 in adult 03/11/2016  . ANEMIA 12/31/2009  . RECTAL BLEEDING 12/31/2009  . DM type 2 causing vascular disease ([place]) 04/12/2007  . DEGENERATIVE JOINT DISEASE, RIGHT KNEE 04/12/2007  . KNEE PAIN 04/12/2007  . Essential hypertension, benign 04/12/2007   Past Medical History:  Diagnosis Date  . Asthmatic bronchitis   . Coronary atherosclerosis of native coronary artery   . DDD (degenerative disc disease), lumbar   . Diabetes ([place])   . Gastroesophageal reflux disease   . Hypertension   . Insomnia, unspecified   . Obstructive sleep apnea   . Prostate cancer ([place])   . Rhinitis, allergic     Past Surgical History:  Procedure Laterality Date  . CARDIAC CATHETERIZATION  2015   . OTHER SURGICAL HISTORY  1983   R KNEE ARTHORSCOPY    No current facility-administered medications for this encounter.    Current Outpatient Medications  Medication Sig Dispense Refill Last Dose  . albuterol (PROVENTIL HFA;VENTOLIN HFA) 108 (90 Base) MCG/ACT inhaler Inhale into the lungs every 6 (six) hours as needed for wheezing or shortness of breath.   Taking  . amLODipine (NORVASC) 10 MG tablet Take 10 mg by mouth daily.   Taking  . amLODipine (NORVASC) 5 MG tablet      . aspirin EC 81 MG tablet Take 81 mg by mouth daily.   Taking  . atorvastatin (LIPITOR) 80 MG tablet TAKE 1 TABLET DAILY (DOSE INCREASE) 90 tablet 1 Taking  . BD VEO INSULIN SYRINGE U/F 31G X 15/64" 0.3 ML MISC      . cetirizine (ZYRTEC) 10 MG tablet Take 10 mg by mouth daily.   Taking  . Cholecalciferol (VITAMIN D3) 1000 units CAPS Take by mouth daily.   Taking  . citalopram (CELEXA) 20 MG tablet Take 20 mg by mouth daily.   Taking  . glimepiride (AMARYL) 4 MG tablet    Taking  . glucose blood (FREESTYLE LITE) test strip Use as instructed bid 100 each 1 Taking  . glucose monitoring kit (FREESTYLE) monitoring kit 1 each by Does not apply route as needed for other.   Taking  . Insulin Degludec (TRESIBA FLEXTOUCH) 200 UNIT/ML SOPN Inject 20 Units into the skin at bedtime. 9 mL 0 Taking  . Insulin Pen Needle (B-D ULTRAFINE III SHORT PEN) 31G X 8 MM MISC 1 each by Does not  apply route as directed. 100 each 3 Taking  . Insulin Syringe-Needle U-100 (BD VEO INSULIN SYRINGE U/F) 31G X 15/64" 0.3 ML MISC USE 1 THREE TIMES A DAY WITH MEALS     . losartan-hydrochlorothiazide (HYZAAR) 100-25 MG tablet      . metFORMIN (GLUCOPHAGE) 500 MG tablet Take 500 mg by mouth 2 (two) times daily with a meal.   Taking  . mometasone (NASONEX) 50 MCG/ACT nasal spray Place 2 sprays into the nose daily.   Taking  . nitroGLYCERIN (NITROSTAT) 0.4 MG SL tablet Place 0.4 mg under the tongue every 5 (five) minutes as needed for chest pain.   Taking   . rOPINIRole (REQUIP) 1 MG tablet Take 1 tablet by mouth 2 (two) times daily.   Taking  . Semaglutide (OZEMPIC) 0.25 or 0.5 MG/DOSE SOPN Inject 0.'25mg'$  once weekly for 4 weeks, then increase to 0.'5mg'$  once weekly if tolerating     . sitaGLIPtin (JANUVIA) 100 MG tablet Take 100 mg by mouth daily.   Taking  . traMADol (ULTRAM) 50 MG tablet Take 50 mg by mouth every 6 (six) hours as needed.   Taking  . valsartan-hydrochlorothiazide (DIOVAN-HCT) 160-12.5 MG tablet Take 1 tablet by mouth daily.   Taking   No Known Allergies  Social History   Tobacco Use  . Smoking status: Former Smoker    Last attempt to quit: 09/01/1982    Years since quitting: 35.2  . Smokeless tobacco: Never Used  Substance Use Topics  . Alcohol use: No    Family History  Problem Relation Age of Onset  . Cancer Sister      Review of Systems  Constitutional: Negative.   HENT: Negative.   Respiratory: Negative.   Cardiovascular: Negative.   Gastrointestinal: Negative.   Genitourinary: Negative.   Musculoskeletal: Positive for joint pain.  Skin: Negative.   Neurological: Negative.   Psychiatric/Behavioral: Negative.     Objective:  Physical Exam  Constitutional: He is oriented to person, place, and time. He appears well-nourished. No distress.  HENT:  Head: Normocephalic and atraumatic.  Eyes: Pupils are equal, round, and reactive to light.  Neck: Normal range of motion.  Cardiovascular: Normal rate.  Respiratory: No respiratory distress.  GI: He exhibits no distension.  Musculoskeletal: He exhibits tenderness.  Neurological: He is alert and oriented to person, place, and time.  Skin: Skin is warm and dry.  Psychiatric: He has a normal mood and affect.    Vital signs in last 24 hours: Temp:  [97.7 F (36.5 C)] 97.7 F (36.5 C) (07/03 0847) Pulse Rate:  [66] 66 (07/03 0847) Resp:  [16] 16 (07/03 0847) BP: (105)/(64) 105/64 (07/03 0847) SpO2:  [94 %] 94 % (07/03 0847) Weight:  [240 lb (108.9 kg)]  240 lb (108.9 kg) (07/03 0847)  Labs:   Estimated body mass index is 33.47 kg/m as calculated from the following:   Height as of 11/25/17: '5\' 11"'$  (1.803 m).   Weight as of 11/25/17: 240 lb (108.9 kg).   Imaging Review Plain radiographs demonstrate moderate degenerative joint disease of the right knee(s). The overall alignment ismild varus. The bone quality appears to be good for age and reported activity level.   Preoperative templating of the joint replacement has been completed, documented, and submitted to the Operating Room personnel in order to optimize intra-operative equipment management.     Assessment/Plan:  End stage arthritis, right knee   We will plan to get preop cardiac clearance.  Cardiologist Dr. Harl Bowie. The patient  history, physical examination, clinical judgment of the provider and imaging studies are consistent with end stage degenerative joint disease of the right knee(s) and total knee arthroplasty is deemed medically necessary. The treatment options including medical management, injection therapy arthroscopy and arthroplasty were discussed at length. The risks and benefits of total knee arthroplasty were presented and reviewed. The risks due to aseptic loosening, infection, stiffness, patella tracking problems, thromboembolic complications and other imponderables were discussed. The patient acknowledged the explanation, agreed to proceed with the plan and consent was signed. Patient is being admitted for inpatient treatment for surgery, pain control, PT, OT, prophylactic antibiotics, VTE prophylaxis, progressive ambulation and ADL's and discharge planning. The patient is planning to be discharged home with home health services

## 2017-11-27 ENCOUNTER — Ambulatory Visit (HOSPITAL_COMMUNITY)
Admission: RE | Admit: 2017-11-27 | Discharge: 2017-11-27 | Disposition: A | Discharge status: Home / Self Care | Payer: Medicare Other | Source: Ambulatory Visit | Attending: Surgery | Admitting: Surgery

## 2017-11-27 ENCOUNTER — Other Ambulatory Visit: Payer: Self-pay

## 2017-11-27 ENCOUNTER — Encounter (HOSPITAL_COMMUNITY)
Admission: RE | Admit: 2017-11-27 | Discharge: 2017-11-27 | Disposition: A | Discharge status: Home / Self Care | Payer: Medicare Other | Source: Ambulatory Visit | Attending: Orthopaedic Surgery | Admitting: Orthopaedic Surgery

## 2017-11-27 ENCOUNTER — Encounter (HOSPITAL_COMMUNITY): Payer: Self-pay

## 2017-11-27 DIAGNOSIS — Z79899 Other long term (current) drug therapy: Secondary | ICD-10-CM | POA: Diagnosis not present

## 2017-11-27 DIAGNOSIS — Z01818 Encounter for other preprocedural examination: Secondary | ICD-10-CM

## 2017-11-27 DIAGNOSIS — I2510 Atherosclerotic heart disease of native coronary artery without angina pectoris: Secondary | ICD-10-CM | POA: Insufficient documentation

## 2017-11-27 DIAGNOSIS — Z01812 Encounter for preprocedural laboratory examination: Secondary | ICD-10-CM | POA: Diagnosis not present

## 2017-11-27 DIAGNOSIS — G8929 Other chronic pain: Secondary | ICD-10-CM | POA: Diagnosis not present

## 2017-11-27 DIAGNOSIS — M1711 Unilateral primary osteoarthritis, right knee: Secondary | ICD-10-CM | POA: Diagnosis not present

## 2017-11-27 HISTORY — DX: Pneumonia, unspecified organism: J18.9

## 2017-11-27 HISTORY — DX: Restless legs syndrome: G25.81

## 2017-11-27 LAB — APTT: aPTT: 30 seconds (ref 24–36)

## 2017-11-27 LAB — URINALYSIS, ROUTINE W REFLEX MICROSCOPIC
BILIRUBIN URINE: NEGATIVE
Glucose, UA: 50 mg/dL — AB
Hgb urine dipstick: NEGATIVE
Ketones, ur: NEGATIVE mg/dL
LEUKOCYTES UA: NEGATIVE
NITRITE: NEGATIVE
Protein, ur: NEGATIVE mg/dL
SPECIFIC GRAVITY, URINE: 1.019 (ref 1.005–1.030)
pH: 5 (ref 5.0–8.0)

## 2017-11-27 LAB — CBC
HCT: 40.3 % (ref 39.0–52.0)
Hemoglobin: 13.5 g/dL (ref 13.0–17.0)
MCH: 33.2 pg (ref 26.0–34.0)
MCHC: 33.5 g/dL (ref 30.0–36.0)
MCV: 99 fL (ref 78.0–100.0)
PLATELETS: 205 10*3/uL (ref 150–400)
RBC: 4.07 MIL/uL — AB (ref 4.22–5.81)
RDW: 12 % (ref 11.5–15.5)
WBC: 6.7 10*3/uL (ref 4.0–10.5)

## 2017-11-27 LAB — COMPREHENSIVE METABOLIC PANEL
ALT: 31 U/L (ref 0–44)
AST: 33 U/L (ref 15–41)
Albumin: 4.1 g/dL (ref 3.5–5.0)
Alkaline Phosphatase: 99 U/L (ref 38–126)
Anion gap: 10 (ref 5–15)
BUN: 13 mg/dL (ref 8–23)
CHLORIDE: 95 mmol/L — AB (ref 98–111)
CO2: 27 mmol/L (ref 22–32)
Calcium: 9.8 mg/dL (ref 8.9–10.3)
Creatinine, Ser: 1.27 mg/dL — ABNORMAL HIGH (ref 0.61–1.24)
GFR calc non Af Amer: 55 mL/min — ABNORMAL LOW (ref >60)
Glucose, Bld: 236 mg/dL — ABNORMAL HIGH (ref 70–99)
Potassium: 4.7 mmol/L (ref 3.5–5.1)
SODIUM: 132 mmol/L — AB (ref 135–145)
TOTAL PROTEIN: 7.6 g/dL (ref 6.5–8.1)
Total Bilirubin: 0.8 mg/dL (ref 0.3–1.2)

## 2017-11-27 LAB — SURGICAL PCR SCREEN
MRSA, PCR: NEGATIVE
STAPHYLOCOCCUS AUREUS: NEGATIVE

## 2017-11-27 LAB — PROTIME-INR
INR: 1
Prothrombin Time: 13.1 seconds (ref 11.4–15.2)

## 2017-11-27 LAB — GLUCOSE, CAPILLARY: Glucose-Capillary: 221 mg/dL — ABNORMAL HIGH (ref 70–99)

## 2017-11-27 NOTE — Progress Notes (Signed)
Pt has hx of CAD with stents. Dr. Harl Bowie is his cardiologist, last visit was 07/01/17. Pt was instructed to stop Aspirin 5 days prior to surgery. Pt is a type 2 diabetic. Last A1C was 6.8 on 11/09/17. Pt states his fasting blood sugar is usually around 115. Pt's CBG was 221 today, states he ate breakfast at 7:30 AM, oatmeal and juice.

## 2017-11-30 ENCOUNTER — Encounter (HOSPITAL_COMMUNITY): Payer: Self-pay

## 2017-11-30 NOTE — Progress Notes (Signed)
Anesthesia Chart Review:   Case:  [id number] Date/Time:  12/09/17 1215   Procedure:  RIGHT TOTAL KNEE ARTHROPLASTY-CEMENTED (Right )   Anesthesia type:  Spinal   Pre-op diagnosis:  right knee osteoarthritis   Location:  [facility] OR ROOM 06 / [place] OR   Surgeon:  [name], MD      DISCUSSION: - Pt is a 72 year old male with hx CAD (s/p BMS to RCA, occluded LAD with collaterals, severe CX disease by 2015 cath), HTN, DM, OSA.    VS: BP 119/63   Pulse 69   Temp 36.8 C (Oral)   Resp 17   Ht '5\' 11"'$  (1.803 m)   Wt 248 lb (112.5 kg)   SpO2 95%   BMI 34.59 kg/m    PROVIDERS: - PCP is Hemberg, Karie Schwalbe, NP  who cleared pt for surgery (notes in care everywhere).  - Cardiologist is Carlyle Dolly, MD who cleared pt for surgery. Last office visit 07/01/17   LABS: Labs reviewed: Acceptable for surgery.  - hbA1c was 6.8 on 11/09/17 (care everywhere)  (all labs ordered are listed, but only abnormal results are displayed)  Labs Reviewed  GLUCOSE, CAPILLARY - Abnormal; Notable for the following components:      Result Value   Glucose-Capillary 221 (*)    All other components within normal limits  CBC - Abnormal; Notable for the following components:   RBC 4.07 (*)    All other components within normal limits  COMPREHENSIVE METABOLIC PANEL - Abnormal; Notable for the following components:   Sodium 132 (*)    Chloride 95 (*)    Glucose, Bld 236 (*)    Creatinine, Ser 1.27 (*)    GFR calc non Af Amer 55 (*)    All other components within normal limits  URINALYSIS, ROUTINE W REFLEX MICROSCOPIC - Abnormal; Notable for the following components:   Glucose, UA 50 (*)    All other components within normal limits  SURGICAL PCR SCREEN  APTT  PROTIME-INR     IMAGES:  CXR 11/27/17: No edema or consolidation.   EKG 07/01/17: sinus rhythm. LAFB.    CV:  Cardiac cath 03/31/14 (care everywhere):  1.Totally occluded LAD with strong right to left collaterals 2.Severe disease in the  circumflex but small caliber vessel 3.Severe disease in the future dominant right coronary artery that was responsible for collaterals to the LAD. 4.Successful direct stenting with a bare-metal stent of the RCA 5.Preserved left ventricular function   Past Medical History:  Diagnosis Date  . Asthmatic bronchitis    as a child  . Coronary atherosclerosis of native coronary artery    BMS to RCA 2015  . DDD (degenerative disc disease), lumbar   . Diabetes ([place])   . Gastroesophageal reflux disease   . Hypertension   . Insomnia, unspecified   . Obstructive sleep apnea    uses cpap  . Pneumonia   . Prostate cancer [facility])    had prostate removed  . Restless legs syndrome   . Rhinitis, allergic     Past Surgical History:  Procedure Laterality Date  . CARDIAC CATHETERIZATION  2015  . CORONARY ANGIOPLASTY    . OTHER SURGICAL HISTORY  1983   R KNEE ARTHORSCOPY x 3  . TONSILLECTOMY      MEDICATIONS: . albuterol (PROVENTIL HFA;VENTOLIN HFA) 108 (90 Base) MCG/ACT inhaler  . amLODipine (NORVASC) 10 MG tablet  . aspirin EC 81 MG tablet  . atorvastatin (LIPITOR) 80 MG tablet  .  BD VEO INSULIN SYRINGE U/F 31G X 15/64" 0.3 ML MISC  . cetirizine (ZYRTEC) 10 MG tablet  . Cholecalciferol (VITAMIN D3) 1000 units CAPS  . citalopram (CELEXA) 20 MG tablet  . glucose blood (FREESTYLE LITE) test strip  . glucose monitoring kit (FREESTYLE) monitoring kit  . Insulin Degludec (TRESIBA FLEXTOUCH) 200 UNIT/ML SOPN  . Insulin Pen Needle (B-D ULTRAFINE III SHORT PEN) 31G X 8 MM MISC  . Insulin Syringe-Needle U-100 (BD VEO INSULIN SYRINGE U/F) 31G X 15/64" 0.3 ML MISC  . losartan-hydrochlorothiazide (HYZAAR) 100-25 MG tablet  . metFORMIN (GLUCOPHAGE) 500 MG tablet  . mometasone (NASONEX) 50 MCG/ACT nasal spray  . nitroGLYCERIN (NITROSTAT) 0.4 MG SL tablet  . rOPINIRole (REQUIP) 1 MG tablet  . Semaglutide (OZEMPIC) 0.25 or 0.5 MG/DOSE SOPN  . sitaGLIPtin (JANUVIA) 100 MG tablet   No current  facility-administered medications for this encounter.     If no changes, I anticipate pt can proceed with surgery as scheduled.   Willeen Cass, FNP-BC [facility]/Anesthesiology Phone: [phone] 12/02/2017 3:01 PM

## 2017-12-08 MED ORDER — CEFAZOLIN SODIUM-DEXTROSE 2-4 GM/100ML-% IV SOLN
2.0000 g | INTRAVENOUS | Status: AC
  Administered 2017-12-09: 2 g via INTRAVENOUS
  Filled 2017-12-08: qty 100

## 2017-12-09 ENCOUNTER — Encounter (HOSPITAL_COMMUNITY): Payer: Self-pay | Admitting: Orthopedic Surgery

## 2017-12-09 ENCOUNTER — Other Ambulatory Visit: Payer: Self-pay

## 2017-12-09 ENCOUNTER — Inpatient Hospital Stay (HOSPITAL_COMMUNITY): Payer: Medicare Other | Admitting: Emergency Medicine

## 2017-12-09 ENCOUNTER — Encounter (HOSPITAL_COMMUNITY)
Admission: RE | Disposition: A | Discharge status: Skilled Nursing Facility | Payer: Self-pay | Source: Ambulatory Visit | Attending: Orthopaedic Surgery

## 2017-12-09 ENCOUNTER — Inpatient Hospital Stay (HOSPITAL_COMMUNITY)
Admission: RE | Admit: 2017-12-09 | Discharge: 2017-12-13 | DRG: 470 | Disposition: A | Discharge status: Skilled Nursing Facility | Payer: Medicare Other | Source: Ambulatory Visit | Attending: Orthopaedic Surgery | Admitting: Orthopaedic Surgery

## 2017-12-09 ENCOUNTER — Other Ambulatory Visit: Payer: Self-pay | Admitting: Cardiology

## 2017-12-09 ENCOUNTER — Inpatient Hospital Stay (HOSPITAL_COMMUNITY): Payer: Medicare Other | Admitting: Anesthesiology

## 2017-12-09 ENCOUNTER — Inpatient Hospital Stay (HOSPITAL_COMMUNITY): Payer: Medicare Other

## 2017-12-09 DIAGNOSIS — G4700 Insomnia, unspecified: Secondary | ICD-10-CM | POA: Diagnosis present

## 2017-12-09 DIAGNOSIS — E7800 Pure hypercholesterolemia, unspecified: Secondary | ICD-10-CM | POA: Diagnosis present

## 2017-12-09 DIAGNOSIS — I10 Essential (primary) hypertension: Secondary | ICD-10-CM | POA: Diagnosis present

## 2017-12-09 DIAGNOSIS — I2510 Atherosclerotic heart disease of native coronary artery without angina pectoris: Secondary | ICD-10-CM | POA: Diagnosis present

## 2017-12-09 DIAGNOSIS — Z6834 Body mass index (BMI) 34.0-34.9, adult: Secondary | ICD-10-CM

## 2017-12-09 DIAGNOSIS — K219 Gastro-esophageal reflux disease without esophagitis: Secondary | ICD-10-CM | POA: Diagnosis present

## 2017-12-09 DIAGNOSIS — E669 Obesity, unspecified: Secondary | ICD-10-CM | POA: Diagnosis present

## 2017-12-09 DIAGNOSIS — G2581 Restless legs syndrome: Secondary | ICD-10-CM | POA: Diagnosis present

## 2017-12-09 DIAGNOSIS — Z79899 Other long term (current) drug therapy: Secondary | ICD-10-CM

## 2017-12-09 DIAGNOSIS — E119 Type 2 diabetes mellitus without complications: Secondary | ICD-10-CM | POA: Diagnosis present

## 2017-12-09 DIAGNOSIS — M1711 Unilateral primary osteoarthritis, right knee: Secondary | ICD-10-CM | POA: Diagnosis present

## 2017-12-09 DIAGNOSIS — G4733 Obstructive sleep apnea (adult) (pediatric): Secondary | ICD-10-CM | POA: Diagnosis present

## 2017-12-09 DIAGNOSIS — Z7982 Long term (current) use of aspirin: Secondary | ICD-10-CM | POA: Diagnosis not present

## 2017-12-09 DIAGNOSIS — J309 Allergic rhinitis, unspecified: Secondary | ICD-10-CM | POA: Diagnosis present

## 2017-12-09 DIAGNOSIS — Z8546 Personal history of malignant neoplasm of prostate: Secondary | ICD-10-CM

## 2017-12-09 DIAGNOSIS — Z87891 Personal history of nicotine dependence: Secondary | ICD-10-CM | POA: Diagnosis not present

## 2017-12-09 DIAGNOSIS — Z09 Encounter for follow-up examination after completed treatment for conditions other than malignant neoplasm: Secondary | ICD-10-CM

## 2017-12-09 DIAGNOSIS — Z794 Long term (current) use of insulin: Secondary | ICD-10-CM | POA: Diagnosis not present

## 2017-12-09 DIAGNOSIS — D62 Acute posthemorrhagic anemia: Secondary | ICD-10-CM | POA: Diagnosis not present

## 2017-12-09 DIAGNOSIS — Z79891 Long term (current) use of opiate analgesic: Secondary | ICD-10-CM

## 2017-12-09 HISTORY — PX: TOTAL KNEE ARTHROPLASTY: SHX125

## 2017-12-09 LAB — GLUCOSE, CAPILLARY
GLUCOSE-CAPILLARY: 125 mg/dL — AB (ref 70–99)
GLUCOSE-CAPILLARY: 119 mg/dL — AB (ref 70–99)
Glucose-Capillary: 168 mg/dL — ABNORMAL HIGH (ref 70–99)
Glucose-Capillary: 196 mg/dL — ABNORMAL HIGH (ref 70–99)

## 2017-12-09 SURGERY — ARTHROPLASTY, KNEE, TOTAL
Anesthesia: Spinal | Site: Knee | Laterality: Right

## 2017-12-09 MED ORDER — ONDANSETRON HCL 4 MG PO TABS: 4.0000 mg | ORAL_TABLET | Freq: Four times a day (QID) | ORAL | Status: DC | PRN

## 2017-12-09 MED ORDER — ACETAMINOPHEN 325 MG PO TABS
325.0000 mg | ORAL_TABLET | Freq: Four times a day (QID) | ORAL | Status: DC | PRN
  Administered 2017-12-09 – 2017-12-11 (×5): 650 mg via ORAL
  Filled 2017-12-09 (×5): qty 2

## 2017-12-09 MED ORDER — VITAMIN D3 25 MCG (1000 UT) PO CAPS: 1000.0000 [IU] | ORAL_CAPSULE | Freq: Every day | ORAL | Status: DC

## 2017-12-09 MED ORDER — BUPIVACAINE IN DEXTROSE 0.75-8.25 % IT SOLN
INTRATHECAL | Status: DC | PRN
  Administered 2017-12-09: 15 mg via INTRATHECAL

## 2017-12-09 MED ORDER — ASPIRIN EC 325 MG PO TBEC
325.0000 mg | DELAYED_RELEASE_TABLET | Freq: Every day | ORAL | Status: DC
  Administered 2017-12-10 – 2017-12-13 (×4): 325 mg via ORAL
  Filled 2017-12-09 (×5): qty 1

## 2017-12-09 MED ORDER — CHLORHEXIDINE GLUCONATE 4 % EX LIQD: 60.0000 mL | Freq: Once | CUTANEOUS | Status: DC

## 2017-12-09 MED ORDER — METOCLOPRAMIDE HCL 5 MG PO TABS: 5.0000 mg | ORAL_TABLET | Freq: Three times a day (TID) | ORAL | Status: DC | PRN

## 2017-12-09 MED ORDER — MENTHOL 3 MG MT LOZG: 1.0000 | LOZENGE | OROMUCOSAL | Status: DC | PRN

## 2017-12-09 MED ORDER — OXYCODONE HCL 5 MG PO TABS
5.0000 mg | ORAL_TABLET | Freq: Four times a day (QID) | ORAL | Status: DC | PRN
  Administered 2017-12-09: 5 mg via ORAL
  Filled 2017-12-09: qty 1

## 2017-12-09 MED ORDER — FENTANYL CITRATE (PF) 100 MCG/2ML IJ SOLN
50.0000 ug | Freq: Once | INTRAMUSCULAR | Status: AC
  Administered 2017-12-09: 50 ug via INTRAVENOUS

## 2017-12-09 MED ORDER — METHOCARBAMOL 500 MG PO TABS
500.0000 mg | ORAL_TABLET | Freq: Four times a day (QID) | ORAL | Status: DC | PRN
  Administered 2017-12-09 – 2017-12-12 (×6): 500 mg via ORAL
  Filled 2017-12-09 (×6): qty 1

## 2017-12-09 MED ORDER — METHOCARBAMOL 1000 MG/10ML IJ SOLN
500.0000 mg | Freq: Four times a day (QID) | INTRAVENOUS | Status: DC | PRN
  Filled 2017-12-09: qty 5

## 2017-12-09 MED ORDER — MIDAZOLAM HCL 2 MG/2ML IJ SOLN: 0.5000 mg | Freq: Once | INTRAMUSCULAR | Status: DC | PRN

## 2017-12-09 MED ORDER — PHENYLEPHRINE 40 MCG/ML (10ML) SYRINGE FOR IV PUSH (FOR BLOOD PRESSURE SUPPORT)
PREFILLED_SYRINGE | INTRAVENOUS | Status: DC | PRN
  Administered 2017-12-09: 80 ug via INTRAVENOUS
  Administered 2017-12-09: 40 ug via INTRAVENOUS
  Administered 2017-12-09: 80 ug via INTRAVENOUS
  Administered 2017-12-09: 40 ug via INTRAVENOUS
  Administered 2017-12-09 (×2): 80 ug via INTRAVENOUS

## 2017-12-09 MED ORDER — PHENYLEPHRINE 40 MCG/ML (10ML) SYRINGE FOR IV PUSH (FOR BLOOD PRESSURE SUPPORT)
PREFILLED_SYRINGE | INTRAVENOUS | Status: AC
  Filled 2017-12-09: qty 10

## 2017-12-09 MED ORDER — MEPERIDINE HCL 50 MG/ML IJ SOLN: 6.2500 mg | INTRAMUSCULAR | Status: DC | PRN

## 2017-12-09 MED ORDER — SODIUM CHLORIDE 0.9 % IR SOLN
Status: DC | PRN
  Administered 2017-12-09: 3000 mL

## 2017-12-09 MED ORDER — ONDANSETRON HCL 4 MG/2ML IJ SOLN
4.0000 mg | Freq: Four times a day (QID) | INTRAMUSCULAR | Status: DC | PRN
  Administered 2017-12-11: 4 mg via INTRAVENOUS
  Filled 2017-12-09: qty 2

## 2017-12-09 MED ORDER — ATORVASTATIN CALCIUM 80 MG PO TABS
80.0000 mg | ORAL_TABLET | Freq: Every day | ORAL | Status: DC
  Administered 2017-12-09 – 2017-12-12 (×4): 80 mg via ORAL
  Filled 2017-12-09 (×4): qty 1

## 2017-12-09 MED ORDER — POLYETHYLENE GLYCOL 3350 17 G PO PACK
17.0000 g | PACK | Freq: Every day | ORAL | Status: DC | PRN
  Administered 2017-12-11: 17 g via ORAL
  Filled 2017-12-09: qty 1

## 2017-12-09 MED ORDER — PROPOFOL 1000 MG/100ML IV EMUL
INTRAVENOUS | Status: AC
  Filled 2017-12-09: qty 100

## 2017-12-09 MED ORDER — INSULIN GLARGINE 100 UNIT/ML ~~LOC~~ SOLN
20.0000 [IU] | Freq: Every day | SUBCUTANEOUS | Status: DC
  Administered 2017-12-10 – 2017-12-13 (×4): 20 [IU] via SUBCUTANEOUS
  Filled 2017-12-09 (×4): qty 0.2

## 2017-12-09 MED ORDER — INSULIN PEN NEEDLE 31G X 8 MM MISC: 1.0000 | Status: DC

## 2017-12-09 MED ORDER — BUPIVACAINE-EPINEPHRINE (PF) 0.25% -1:200000 IJ SOLN
INTRAMUSCULAR | Status: AC
  Filled 2017-12-09: qty 30

## 2017-12-09 MED ORDER — PROMETHAZINE HCL 25 MG/ML IJ SOLN: 6.2500 mg | INTRAMUSCULAR | Status: DC | PRN

## 2017-12-09 MED ORDER — LIDOCAINE 2% (20 MG/ML) 5 ML SYRINGE
INTRAMUSCULAR | Status: AC
  Filled 2017-12-09: qty 5

## 2017-12-09 MED ORDER — INSULIN DEGLUDEC 200 UNIT/ML ~~LOC~~ SOPN: 20.0000 [IU] | PEN_INJECTOR | SUBCUTANEOUS | Status: DC

## 2017-12-09 MED ORDER — FLUTICASONE PROPIONATE 50 MCG/ACT NA SUSP
1.0000 | Freq: Every day | NASAL | Status: DC
  Administered 2017-12-11: 1 via NASAL
  Filled 2017-12-09: qty 16

## 2017-12-09 MED ORDER — "INSULIN SYRINGE-NEEDLE U-100 31G X 15/64"" 0.3 ML MISC": Freq: Three times a day (TID) | Status: DC

## 2017-12-09 MED ORDER — LACTATED RINGERS IV SOLN
INTRAVENOUS | Status: DC
  Administered 2017-12-09 (×2): via INTRAVENOUS

## 2017-12-09 MED ORDER — CEFAZOLIN SODIUM-DEXTROSE 1-4 GM/50ML-% IV SOLN
1.0000 g | Freq: Three times a day (TID) | INTRAVENOUS | Status: AC
  Administered 2017-12-09 – 2017-12-10 (×2): 1 g via INTRAVENOUS
  Filled 2017-12-09 (×2): qty 50

## 2017-12-09 MED ORDER — SODIUM CHLORIDE 0.9 % IV SOLN
INTRAVENOUS | Status: DC
Start: 2017-12-09 — End: 2017-12-13
  Administered 2017-12-09 – 2017-12-11 (×3): via INTRAVENOUS

## 2017-12-09 MED ORDER — FENTANYL CITRATE (PF) 100 MCG/2ML IJ SOLN
INTRAMUSCULAR | Status: AC
  Administered 2017-12-09: 50 ug via INTRAVENOUS
  Filled 2017-12-09: qty 2

## 2017-12-09 MED ORDER — LINAGLIPTIN 5 MG PO TABS: 5.0000 mg | ORAL_TABLET | Freq: Every day | ORAL | Status: DC

## 2017-12-09 MED ORDER — BUPIVACAINE LIPOSOME 1.3 % IJ SUSP
20.0000 mL | INTRAMUSCULAR | Status: AC
  Administered 2017-12-09: 20 mL
  Filled 2017-12-09: qty 20

## 2017-12-09 MED ORDER — CITALOPRAM HYDROBROMIDE 20 MG PO TABS
20.0000 mg | ORAL_TABLET | Freq: Every day | ORAL | Status: DC
  Administered 2017-12-10 – 2017-12-13 (×4): 20 mg via ORAL
  Filled 2017-12-09 (×4): qty 1

## 2017-12-09 MED ORDER — MIDAZOLAM HCL 2 MG/2ML IJ SOLN
INTRAMUSCULAR | Status: AC
  Administered 2017-12-09: 1 mg via INTRAVENOUS
  Filled 2017-12-09: qty 2

## 2017-12-09 MED ORDER — PROPOFOL 500 MG/50ML IV EMUL
INTRAVENOUS | Status: DC | PRN
  Administered 2017-12-09: 100 ug/kg/min via INTRAVENOUS
  Administered 2017-12-09: 14:00:00 via INTRAVENOUS

## 2017-12-09 MED ORDER — DOCUSATE SODIUM 100 MG PO CAPS
100.0000 mg | ORAL_CAPSULE | Freq: Two times a day (BID) | ORAL | Status: DC
  Administered 2017-12-09 – 2017-12-13 (×8): 100 mg via ORAL
  Filled 2017-12-09 (×8): qty 1

## 2017-12-09 MED ORDER — ALBUTEROL SULFATE (2.5 MG/3ML) 0.083% IN NEBU: 3.0000 mL | INHALATION_SOLUTION | Freq: Four times a day (QID) | RESPIRATORY_TRACT | Status: DC | PRN

## 2017-12-09 MED ORDER — METOCLOPRAMIDE HCL 5 MG/ML IJ SOLN: 5.0000 mg | Freq: Three times a day (TID) | INTRAMUSCULAR | Status: DC | PRN

## 2017-12-09 MED ORDER — SODIUM CHLORIDE 0.9 % IV SOLN
INTRAVENOUS | Status: DC | PRN
  Administered 2017-12-09: 30 ug/min via INTRAVENOUS

## 2017-12-09 MED ORDER — 0.9 % SODIUM CHLORIDE (POUR BTL) OPTIME
TOPICAL | Status: DC | PRN
  Administered 2017-12-09: 1000 mL

## 2017-12-09 MED ORDER — LORATADINE 10 MG PO TABS
10.0000 mg | ORAL_TABLET | Freq: Every day | ORAL | Status: DC
  Administered 2017-12-10 – 2017-12-13 (×4): 10 mg via ORAL
  Filled 2017-12-09 (×4): qty 1

## 2017-12-09 MED ORDER — LOSARTAN POTASSIUM 50 MG PO TABS
100.0000 mg | ORAL_TABLET | Freq: Every day | ORAL | Status: DC
  Administered 2017-12-10 – 2017-12-13 (×4): 100 mg via ORAL
  Filled 2017-12-09 (×4): qty 2

## 2017-12-09 MED ORDER — NITROGLYCERIN 0.4 MG SL SUBL: 0.4000 mg | SUBLINGUAL_TABLET | SUBLINGUAL | Status: DC | PRN

## 2017-12-09 MED ORDER — METFORMIN HCL 500 MG PO TABS
500.0000 mg | ORAL_TABLET | Freq: Two times a day (BID) | ORAL | Status: DC
  Administered 2017-12-10 – 2017-12-13 (×7): 500 mg via ORAL
  Filled 2017-12-09 (×7): qty 1

## 2017-12-09 MED ORDER — AMLODIPINE BESYLATE 10 MG PO TABS
10.0000 mg | ORAL_TABLET | Freq: Every day | ORAL | Status: DC
  Administered 2017-12-10 – 2017-12-13 (×4): 10 mg via ORAL
  Filled 2017-12-09 (×4): qty 1

## 2017-12-09 MED ORDER — HYDROMORPHONE HCL 1 MG/ML IJ SOLN: 0.2500 mg | INTRAMUSCULAR | Status: DC | PRN

## 2017-12-09 MED ORDER — PHENOL 1.4 % MT LIQD: 1.0000 | OROMUCOSAL | Status: DC | PRN

## 2017-12-09 MED ORDER — INSULIN ASPART 100 UNIT/ML ~~LOC~~ SOLN
0.0000 [IU] | Freq: Three times a day (TID) | SUBCUTANEOUS | Status: DC
  Administered 2017-12-10: 3 [IU] via SUBCUTANEOUS
  Administered 2017-12-10 – 2017-12-12 (×8): 2 [IU] via SUBCUTANEOUS
  Administered 2017-12-13: 1 [IU] via SUBCUTANEOUS

## 2017-12-09 MED ORDER — HYDROCHLOROTHIAZIDE 25 MG PO TABS
25.0000 mg | ORAL_TABLET | Freq: Every day | ORAL | Status: DC
  Administered 2017-12-10 – 2017-12-13 (×4): 25 mg via ORAL
  Filled 2017-12-09 (×4): qty 1

## 2017-12-09 MED ORDER — MIDAZOLAM HCL 2 MG/2ML IJ SOLN
1.0000 mg | Freq: Once | INTRAMUSCULAR | Status: AC
  Administered 2017-12-09: 1 mg via INTRAVENOUS

## 2017-12-09 MED ORDER — PROPOFOL 10 MG/ML IV BOLUS
INTRAVENOUS | Status: AC
  Filled 2017-12-09: qty 20

## 2017-12-09 MED ORDER — FENTANYL CITRATE (PF) 250 MCG/5ML IJ SOLN
INTRAMUSCULAR | Status: AC
  Filled 2017-12-09: qty 5

## 2017-12-09 MED ORDER — ROPINIROLE HCL 1 MG PO TABS
1.0000 mg | ORAL_TABLET | Freq: Two times a day (BID) | ORAL | Status: DC
  Administered 2017-12-09 – 2017-12-13 (×8): 1 mg via ORAL
  Filled 2017-12-09 (×8): qty 1

## 2017-12-09 MED ORDER — LOSARTAN POTASSIUM-HCTZ 100-25 MG PO TABS: 1.0000 | ORAL_TABLET | Freq: Every day | ORAL | Status: DC

## 2017-12-09 SURGICAL SUPPLY — 73 items
APL SKNCLS STERI-STRIP NONHPOA (GAUZE/BANDAGES/DRESSINGS) ×1
BANDAGE ACE 4X5 VEL STRL LF (GAUZE/BANDAGES/DRESSINGS) ×3 IMPLANT
BANDAGE ELASTIC 6 VELCRO ST LF (GAUZE/BANDAGES/DRESSINGS) ×2 IMPLANT
BANDAGE ESMARK 6X9 LF (GAUZE/BANDAGES/DRESSINGS) ×1 IMPLANT
BENZOIN TINCTURE PRP APPL 2/3 (GAUZE/BANDAGES/DRESSINGS) ×3 IMPLANT
BLADE SAGITTAL 25.0X1.19X90 (BLADE) ×2 IMPLANT
BLADE SAGITTAL 25.0X1.19X90MM (BLADE) ×1
BLADE SAW SGTL 13X75X1.27 (BLADE) ×3 IMPLANT
BNDG CMPR 9X6 STRL LF SNTH (GAUZE/BANDAGES/DRESSINGS) ×1
BNDG CMPR MED 10X6 ELC LF (GAUZE/BANDAGES/DRESSINGS) ×1
BNDG ELASTIC 6X10 VLCR STRL LF (GAUZE/BANDAGES/DRESSINGS) ×3 IMPLANT
BNDG ESMARK 6X9 LF (GAUZE/BANDAGES/DRESSINGS) ×3
BOWL SMART MIX CTS (DISPOSABLE) ×3 IMPLANT
CAPT KNEE TOTAL 3 ATTUNE ×2 IMPLANT
CEMENT HV SMART SET (Cement) ×6 IMPLANT
CLOSURE WOUND 1/2 X4 (GAUZE/BANDAGES/DRESSINGS) ×2
COVER SURGICAL LIGHT HANDLE (MISCELLANEOUS) ×3 IMPLANT
CUFF TOURNIQUET SINGLE 34IN LL (TOURNIQUET CUFF) ×3 IMPLANT
CUFF TOURNIQUET SINGLE 44IN (TOURNIQUET CUFF) IMPLANT
DRAPE ORTHO SPLIT 77X108 STRL (DRAPES) ×6
DRAPE SURG ORHT 6 SPLT 77X108 (DRAPES) ×2 IMPLANT
DRAPE U-SHAPE 47X51 STRL (DRAPES) ×3 IMPLANT
DRSG PAD ABDOMINAL 8X10 ST (GAUZE/BANDAGES/DRESSINGS) ×3 IMPLANT
DURAPREP 26ML APPLICATOR (WOUND CARE) ×6 IMPLANT
ELECT REM PT RETURN 9FT ADLT (ELECTROSURGICAL) ×3
ELECTRODE REM PT RTRN 9FT ADLT (ELECTROSURGICAL) ×1 IMPLANT
EVACUATOR 1/8 PVC DRAIN (DRAIN) IMPLANT
FACESHIELD WRAPAROUND (MASK) ×6 IMPLANT
FACESHIELD WRAPAROUND OR TEAM (MASK) ×2 IMPLANT
GAUZE SPONGE 4X4 12PLY STRL (GAUZE/BANDAGES/DRESSINGS) ×3 IMPLANT
GAUZE XEROFORM 5X9 LF (GAUZE/BANDAGES/DRESSINGS) ×3 IMPLANT
GLOVE BIOGEL PI IND STRL 8 (GLOVE) ×2 IMPLANT
GLOVE BIOGEL PI INDICATOR 8 (GLOVE) ×4
GLOVE ORTHO TXT STRL SZ7.5 (GLOVE) ×6 IMPLANT
GOWN STRL REUS W/ TWL LRG LVL3 (GOWN DISPOSABLE) ×1 IMPLANT
GOWN STRL REUS W/ TWL XL LVL3 (GOWN DISPOSABLE) ×1 IMPLANT
GOWN STRL REUS W/TWL 2XL LVL3 (GOWN DISPOSABLE) ×3 IMPLANT
GOWN STRL REUS W/TWL LRG LVL3 (GOWN DISPOSABLE) ×3
GOWN STRL REUS W/TWL XL LVL3 (GOWN DISPOSABLE) ×3
HANDPIECE INTERPULSE COAX TIP (DISPOSABLE) ×3
IMMOBILIZER KNEE 22 UNIV (SOFTGOODS) ×3 IMPLANT
KIT BASIN OR (CUSTOM PROCEDURE TRAY) ×3 IMPLANT
KIT TURNOVER KIT B (KITS) ×3 IMPLANT
MANIFOLD NEPTUNE II (INSTRUMENTS) ×3 IMPLANT
MARKER SKIN DUAL TIP RULER LAB (MISCELLANEOUS) ×3 IMPLANT
NDL 18GX1X1/2 (RX/OR ONLY) (NEEDLE) ×1 IMPLANT
NDL HYPO 25GX1X1/2 BEV (NEEDLE) ×1 IMPLANT
NEEDLE 18GX1X1/2 (RX/OR ONLY) (NEEDLE) ×3 IMPLANT
NEEDLE HYPO 25GX1X1/2 BEV (NEEDLE) ×3 IMPLANT
NS IRRIG 1000ML POUR BTL (IV SOLUTION) ×3 IMPLANT
PACK TOTAL JOINT (CUSTOM PROCEDURE TRAY) ×3 IMPLANT
PAD ABD 8X10 STRL (GAUZE/BANDAGES/DRESSINGS) ×2 IMPLANT
PAD ARMBOARD 7.5X6 YLW CONV (MISCELLANEOUS) ×6 IMPLANT
PAD CAST 4YDX4 CTTN HI CHSV (CAST SUPPLIES) ×1 IMPLANT
PADDING CAST COTTON 4X4 STRL (CAST SUPPLIES) ×3
PADDING CAST COTTON 6X4 STRL (CAST SUPPLIES) ×3 IMPLANT
SET HNDPC FAN SPRY TIP SCT (DISPOSABLE) ×1 IMPLANT
STAPLER VISISTAT 35W (STAPLE) IMPLANT
STRIP CLOSURE SKIN 1/2X4 (GAUZE/BANDAGES/DRESSINGS) ×4 IMPLANT
SUCTION FRAZIER HANDLE 10FR (MISCELLANEOUS) ×2
SUCTION TUBE FRAZIER 10FR DISP (MISCELLANEOUS) ×1 IMPLANT
SUT VIC AB 0 CT1 27 (SUTURE) ×3
SUT VIC AB 0 CT1 27XBRD ANBCTR (SUTURE) ×1 IMPLANT
SUT VIC AB 1 CTX 36 (SUTURE) ×6
SUT VIC AB 1 CTX36XBRD ANBCTR (SUTURE) ×2 IMPLANT
SUT VIC AB 2-0 CT1 27 (SUTURE) ×6
SUT VIC AB 2-0 CT1 TAPERPNT 27 (SUTURE) ×2 IMPLANT
SUT VIC AB 3-0 X1 27 (SUTURE) ×3 IMPLANT
SYR 50ML LL SCALE MARK (SYRINGE) ×3 IMPLANT
SYR CONTROL 10ML LL (SYRINGE) ×3 IMPLANT
TOWEL OR 17X24 6PK STRL BLUE (TOWEL DISPOSABLE) ×3 IMPLANT
TOWEL OR 17X26 10 PK STRL BLUE (TOWEL DISPOSABLE) ×3 IMPLANT
TRAY CATH 16FR W/PLASTIC CATH (SET/KITS/TRAYS/PACK) IMPLANT

## 2017-12-09 NOTE — Interval H&P Note (Signed)
History and Physical Interval Note:  12/09/2017 12:17 PM  Johnny Huber  has presented today for surgery, with the diagnosis of right knee osteoarthritis  The various methods of treatment have been discussed with the patient and family. After consideration of risks, benefits and other options for treatment, the patient has consented to  Procedure(s): RIGHT TOTAL KNEE ARTHROPLASTY-CEMENTED (Right) as a surgical intervention .  The patient's history has been reviewed, patient examined, no change in status, stable for surgery.  I have reviewed the patient's chart and labs.  Questions were answered to the patient's satisfaction.     [name]

## 2017-12-09 NOTE — Progress Notes (Signed)
Xrays completed - awaiting CPM

## 2017-12-09 NOTE — Transfer of Care (Signed)
Immediate Anesthesia Transfer of Care Note  Patient: Johnny Huber  Procedure(s) Performed: RIGHT TOTAL KNEE ARTHROPLASTY-CEMENTED (Right Knee)  Patient Location: PACU  Anesthesia Type:Regional and Spinal  Level of Consciousness: awake, alert  and oriented  Airway & Oxygen Therapy: Patient Spontanous Breathing and Patient connected to nasal cannula oxygen  Post-op Assessment: Report given to RN and Post -op Vital signs reviewed and stable  Post vital signs: Reviewed and stable  Last Vitals:  Vitals Value Taken Time  BP 102/63 12/09/2017  2:39 PM  Temp    Pulse 69 12/09/2017  2:40 PM  Resp 19 12/09/2017  2:40 PM  SpO2 94 % 12/09/2017  2:40 PM  Vitals shown include unvalidated device data.  Last Pain:  Vitals:   12/09/17 1041  TempSrc:   PainSc: 0-No pain      Patients Stated Pain Goal: 3 (17/91/50 5697)  Complications: No apparent anesthesia complications

## 2017-12-09 NOTE — Anesthesia Preprocedure Evaluation (Addendum)
Anesthesia Evaluation  Patient identified by MRN, date of birth, ID band Patient awake    Reviewed: Allergy & Precautions, NPO status , Patient's Chart, lab work & pertinent test results  History of Anesthesia Complications Negative for: history of anesthetic complications  Airway Mallampati: II  TM Distance: >3 FB Neck ROM: Full    Dental  (+) Dental Advisory Given, Poor Dentition, Chipped, Missing   Pulmonary sleep apnea and Continuous Positive Airway Pressure Ventilation , COPD,  COPD inhaler, former smoker,    breath sounds clear to auscultation       Cardiovascular hypertension, Pt. on medications (-) angina+ CAD and + Cardiac Stents (BMS in RCA)   Rhythm:Regular Rate:Normal     Neuro/Psych DDD negative neurological ROS     GI/Hepatic Neg liver ROS, GERD  Controlled,  Endo/Other  diabetes (glu 168), Insulin Dependent, Oral Hypoglycemic AgentsMorbid obesity  Renal/GU negative Renal ROS     Musculoskeletal  (+) Arthritis , Osteoarthritis,    Abdominal (+) + obese,   Peds  Hematology negative hematology ROS (+)   Anesthesia Other Findings   Reproductive/Obstetrics                            Anesthesia Physical Anesthesia Plan  ASA: III  Anesthesia Plan: Spinal   Post-op Pain Management:  Regional for Post-op pain   Induction:   PONV Risk Score and Plan: 1 and Ondansetron and Treatment may vary due to age or medical condition  Airway Management Planned: Natural Airway and Simple Face Mask  Additional Equipment:   Intra-op Plan:   Post-operative Plan:   Informed Consent: I have reviewed the patients History and Physical, chart, labs and discussed the procedure including the risks, benefits and alternatives for the proposed anesthesia with the patient or authorized representative who has indicated his/her understanding and acceptance.   Dental advisory given  Plan  Discussed with: CRNA and Surgeon  Anesthesia Plan Comments: (Plan routine monitors, SAB with adductor canal block for post op analgesia)        Anesthesia Quick Evaluation

## 2017-12-09 NOTE — Op Note (Signed)
Preop diagnosis: Right knee primary osteoarthritis  Postop diagnosis: Same  Procedure: Right cemented total knee arthroplasty  Surgeon: Rodell Perna, MD  Assistant: Benjiman Core, PA-C medically necessary and present for the entire procedure  Anesthesia: Preoperative adductor block plus spinal plus Marcaine and Exparel prior to closure.  Tourniquet time: See anesthetic record  Implants:Attune Depuy #7 femur #6 tibia #7 5 mm thick rotating platform.  41 mm 3 peg patella  Procedure: After standard prepping and draping with preoperative after adductor block followed by spinal proximal thigh tourniquet lateral post heel bump DuraPrep from the tip the toes.  Extremity sheets drapes impervious stockinette sterile skin marker Covan and Betadine Steri-Drape some of the skin.  Timeout procedure completed leg wrapped with Esmarch tourniquet inflated.  Midline incision medial parapatellar incision proximal tricompartmental degenerative changes noted with osteophytes removed off the femur and tibia.  Menisci remnants were resected ACL PCL resected.  There is complete loss of cartilage on the femur and tibia.  9 mm was taken off the distal femur 9 off the tibia.  Chamfer cuts were made and with spacer block the knee was not quite in extension and additional 2 mm were taken off of the tibia.  Box cut was made in the femur followed by removing 10 mm off of the patella.  Keel preparation on the tibia trial implants full extension good collateral balance pulse lavage vacuum mixing of the cement and cementing of the tibia followed by femur placement of permanent poly-and then the 3 peg patella held with compression clamp.  All excessive cement was removed.  Cement was hard to 15 minutes.  Repeat pulse lavage after hemostasis.  Standard #1 Vicryl closure of the deep retinaculum ~subtenons tissue and superficial retinaculum.  Skin closure postop dressing knee immobilizer and transferred to recovery room.  Instrument count  needle count was correct.

## 2017-12-09 NOTE — Anesthesia Procedure Notes (Signed)
Spinal  Patient location during procedure: OR End time: 12/09/2017 12:37 PM Staffing Anesthesiologist: Annye Asa, MD Performed: anesthesiologist  Preanesthetic Checklist Completed: patient identified, site marked, surgical consent, pre-op evaluation, timeout performed, IV checked, risks and benefits discussed and monitors and equipment checked Spinal Block Patient position: sitting Prep: site prepped and draped and DuraPrep Patient monitoring: blood pressure, continuous pulse ox, cardiac monitor and heart rate Approach: midline Location: L3-4 Injection technique: single-shot Needle Needle type: Pencan  Needle gauge: 24 G Needle length: 9 cm Additional Notes Pt identified in Operating room.  Monitors applied. Working IV access confirmed. Sterile prep, drape lumbar spine.  1% lido local L 3,4.  #24ga Pencan into clear CSF L 3,4.  15mg  0.75% Bupivacaine with dextrose injected with asp CSF beginning and end of injection.  Patient asymptomatic, VSS, no heme aspirated, tolerated well.  Jenita Seashore, MD

## 2017-12-09 NOTE — Progress Notes (Signed)
Orthopedic Tech Progress Note Patient Details:  Johnny Huber [birth year] [id number]  CPM Right Knee CPM Right Knee: On Right Knee Flexion (Degrees): 90 Right Knee Extension (Degrees): 0 Additional Comments: trapeze bar patient helper  Post Interventions Patient Tolerated: Well Instructions Provided: Care of device Viewed order from doctor's order list Hildred Priest 12/09/2017, 3:48 PM

## 2017-12-09 NOTE — Anesthesia Postprocedure Evaluation (Signed)
Anesthesia Post Note  Patient: Johnny Huber  Procedure(s) Performed: RIGHT TOTAL KNEE ARTHROPLASTY-CEMENTED (Right Knee)     Patient location during evaluation: PACU Anesthesia Type: Spinal and Regional Level of consciousness: awake and alert, oriented and patient cooperative Pain management: pain level controlled Vital Signs Assessment: post-procedure vital signs reviewed and stable Respiratory status: spontaneous breathing, nonlabored ventilation and respiratory function stable Cardiovascular status: blood pressure returned to baseline and stable Postop Assessment: spinal receding, patient able to bend at knees and no apparent nausea or vomiting Anesthetic complications: no    Last Vitals:  Vitals:   12/09/17 1600 12/09/17 1624  BP:  115/69  Pulse: (!) 58 (!) 58  Resp: 14 15  Temp:  36.5 C  SpO2: 96% 97%    Last Pain:  Vitals:   12/09/17 1624  TempSrc: Oral  PainSc: 0-No pain                 Meika Earll,E. Samirah Scarpati

## 2017-12-09 NOTE — Anesthesia Procedure Notes (Signed)
Anesthesia Regional Block: Adductor canal block   Pre-Anesthetic Checklist: ,, timeout performed, Correct Patient, Correct Site, Correct Laterality, Correct Procedure, Correct Position, site marked, Risks and benefits discussed,  Surgical consent,  Pre-op evaluation,  At surgeon's request and post-op pain management  Laterality: Right and Lower  Prep: chloraprep       Needles:  Injection technique: Single-shot  Needle Type: Echogenic Needle     Needle Length: 9cm  Needle Gauge: 21     Additional Needles:   Procedures:,,,, ultrasound used (permanent image in chart),,,,  Narrative:  Start time: 12/09/2017 11:34 AM End time: 12/09/2017 11:40 AM Injection made incrementally with aspirations every 5 mL.  Performed by: Personally  Anesthesiologist: Annye Asa, MD  Additional Notes: Pt identified in Holding room.  Monitors applied. Working IV access confirmed. Sterile prep, drape R thigh.  #21ga ECHOgenic needle into adductor canal with US guidance.  20cc 0.75% Ropivacaine injected incrementally after negative test dose.  Patient asymptomatic, VSS, no heme aspirated, tolerated well.  Jenita Seashore, MD

## 2017-12-09 NOTE — Evaluation (Signed)
Physical Therapy Evaluation Patient Details Name: ISSAAC Huber MRN: [id number] DOB: 28-Apr-1946 Today's Date: 12/09/2017   History of Present Illness  Pt is a 72 y/o male s/p elective R TKA. PMH includes OSA, HTN, DM, and prostate cancer.   Clinical Impression  Pt is s/p surgery above with deficits below. Pt presenting with post op deficits and R knee buckling secondary to decreased sensation. Required min guard to min A for mobility with RW this session. Reviewed knee precautions and supine HEP. Will continue to follow acutely to maximize functional mobility independence and safety.     Follow Up Recommendations Home health PT;Supervision for mobility/OOB    Equipment Recommendations  Rolling walker with 5" wheels    Recommendations for Other Services       Precautions / Restrictions Precautions Precautions: Knee Precaution Booklet Issued: Yes (comment) Precaution Comments: Reviewed knee precautions and supine HEP.  Required Braces or Orthoses: Knee Immobilizer - Right Knee Immobilizer - Right: Other (comment)(at all times except with PT/in CPM) Restrictions Weight Bearing Restrictions: Yes RLE Weight Bearing: Weight bearing as tolerated      Mobility  Bed Mobility Overal bed mobility: Needs Assistance Bed Mobility: Supine to Sit     Supine to sit: Supervision     General bed mobility comments: Supervision for safety. Increased time required.   Transfers Overall transfer level: Needs assistance Equipment used: Rolling walker (2 wheeled) Transfers: Sit to/from Stand Sit to Stand: Min assist         General transfer comment: Min A for lift assist and steadying. Verbal cues for safe hand placement.   Ambulation/Gait Ambulation/Gait assistance: Min guard;Min assist Gait Distance (Feet): 5 Feet Assistive device: Rolling walker (2 wheeled) Gait Pattern/deviations: Step-to pattern;Decreased step length - right;Decreased step length - left;Decreased weight shift  to right;Antalgic Gait velocity: Decreased    General Gait Details: Slow, antalgic gait. Slight R knee buckling noted, even with KI, so gait limited to chair. Verbal cues for sequencing using RW.   Stairs            Wheelchair Mobility    Modified Rankin (Stroke Patients Only)       Balance Overall balance assessment: Needs assistance Sitting-balance support: No upper extremity supported;Feet supported Sitting balance-Leahy Scale: Fair     Standing balance support: Bilateral upper extremity supported;During functional activity Standing balance-Leahy Scale: Poor Standing balance comment: Reliant on BUE support.                              Pertinent Vitals/Pain Pain Assessment: Faces Faces Pain Scale: Hurts a little bit Pain Location: R knee  Pain Descriptors / Indicators: Aching;Operative site guarding Pain Intervention(s): Limited activity within patient's tolerance;Monitored during session;Repositioned    Home Living Family/patient expects to be discharged to:: Private residence Living Arrangements: Spouse/significant other;Children Available Help at Discharge: Family;Available 24 hours/day Type of Home: House Home Access: Ramped entrance     Home Layout: One level Home Equipment: Bedside commode      Prior Function Level of Independence: Independent               Hand Dominance        Extremity/Trunk Assessment   Upper Extremity Assessment Upper Extremity Assessment: Overall WFL for tasks assessed    Lower Extremity Assessment Lower Extremity Assessment: RLE deficits/detail RLE Deficits / Details: Reports decreased sensation in RLE. Deficits consistent with post op pain and weakness. Able to perform ther ex  below.  RLE Sensation: decreased light touch    Cervical / Trunk Assessment Cervical / Trunk Assessment: Normal  Communication   Communication: No difficulties  Cognition Arousal/Alertness: Awake/alert Behavior During  Therapy: WFL for tasks assessed/performed Overall Cognitive Status: Within Functional Limits for tasks assessed                                        General Comments General comments (skin integrity, edema, etc.): Pt's wife present during session.     Exercises Total Joint Exercises Ankle Circles/Pumps: AROM;Both;20 reps Quad Sets: AROM;Right;10 reps Heel Slides: AROM;Right;10 reps   Assessment/Plan    PT Assessment Patient needs continued PT services  PT Problem List Decreased strength;Decreased range of motion;Decreased balance;Decreased mobility;Decreased knowledge of use of DME;Decreased knowledge of precautions;Impaired sensation;Pain       PT Treatment Interventions DME instruction;Gait training;Functional mobility training;Therapeutic activities;Therapeutic exercise;Balance training;Patient/family education    PT Goals (Current goals can be found in the Care Plan section)  Acute Rehab PT Goals Patient Stated Goal: "to go home tomorrow morning" PT Goal Formulation: With patient Time For Goal Achievement: 12/23/17 Potential to Achieve Goals: Good    Frequency 7X/week   Barriers to discharge        Co-evaluation               AM-PAC PT "6 Clicks" Daily Activity  Outcome Measure Difficulty turning over in bed (including adjusting bedclothes, sheets and blankets)?: A Little Difficulty moving from lying on back to sitting on the side of the bed? : A Little Difficulty sitting down on and standing up from a chair with arms (e.g., wheelchair, bedside commode, etc,.)?: Unable Help needed moving to and from a bed to chair (including a wheelchair)?: A Little Help needed walking in hospital room?: A Little Help needed climbing 3-5 steps with a railing? : A Lot 6 Click Score: 15    End of Session Equipment Utilized During Treatment: Gait belt;Right knee immobilizer Activity Tolerance: Patient tolerated treatment well Patient left: in chair;with call  bell/phone within reach;with family/visitor present Nurse Communication: Mobility status PT Visit Diagnosis: Unsteadiness on feet (R26.81);Other abnormalities of gait and mobility (R26.89);Pain Pain - Right/Left: Right Pain - part of body: Knee    Time: 8325-4982 PT Time Calculation (min) (ACUTE ONLY): 19 min   Charges:   PT Evaluation $PT Eval Low Complexity: 1 Low     PT G Codes:        Leighton Ruff, PT, DPT  Acute Rehabilitation Services  Pager: [phone]   Rudean Hitt 12/09/2017, 5:57 PM

## 2017-12-09 NOTE — Progress Notes (Signed)
1625 Received pt from PACU, A&O x4. RLE with ace wrap dry and intact. Pt is on the CPM at 0-90 when he came to the room. Denies pain at this time.

## 2017-12-10 ENCOUNTER — Encounter (HOSPITAL_COMMUNITY): Payer: Self-pay | Admitting: Orthopaedic Surgery

## 2017-12-10 LAB — GLUCOSE, CAPILLARY
GLUCOSE-CAPILLARY: 234 mg/dL — AB (ref 70–99)
GLUCOSE-CAPILLARY: 212 mg/dL — AB (ref 70–99)
Glucose-Capillary: 174 mg/dL — ABNORMAL HIGH (ref 70–99)
Glucose-Capillary: 175 mg/dL — ABNORMAL HIGH (ref 70–99)

## 2017-12-10 LAB — BASIC METABOLIC PANEL
Anion gap: 8 (ref 5–15)
BUN: 14 mg/dL (ref 8–23)
CALCIUM: 8.6 mg/dL — AB (ref 8.9–10.3)
CHLORIDE: 103 mmol/L (ref 98–111)
CO2: 24 mmol/L (ref 22–32)
CREATININE: 1.24 mg/dL (ref 0.61–1.24)
GFR calc non Af Amer: 56 mL/min — ABNORMAL LOW (ref >60)
GLUCOSE: 200 mg/dL — AB (ref 70–99)
Potassium: 3.8 mmol/L (ref 3.5–5.1)
Sodium: 135 mmol/L (ref 135–145)

## 2017-12-10 LAB — CBC
HEMATOCRIT: 33.3 % — AB (ref 39.0–52.0)
HEMOGLOBIN: 11 g/dL — AB (ref 13.0–17.0)
MCH: 33.3 pg (ref 26.0–34.0)
MCHC: 33 g/dL (ref 30.0–36.0)
MCV: 100.9 fL — ABNORMAL HIGH (ref 78.0–100.0)
Platelets: 177 10*3/uL (ref 150–400)
RBC: 3.3 MIL/uL — ABNORMAL LOW (ref 4.22–5.81)
RDW: 12.6 % (ref 11.5–15.5)
WBC: 8.1 10*3/uL (ref 4.0–10.5)

## 2017-12-10 MED ORDER — OXYCODONE HCL 5 MG PO TABS
5.0000 mg | ORAL_TABLET | ORAL | Status: DC | PRN
Start: 2017-12-10 — End: 2017-12-11

## 2017-12-10 MED ORDER — HYDROMORPHONE HCL 1 MG/ML IJ SOLN
0.5000 mg | INTRAMUSCULAR | Status: DC | PRN
  Administered 2017-12-10 (×2): 0.5 mg via INTRAVENOUS
  Filled 2017-12-10 (×2): qty 1

## 2017-12-10 MED ORDER — OXYCODONE HCL 5 MG PO TABS
10.0000 mg | ORAL_TABLET | ORAL | Status: DC | PRN
  Administered 2017-12-10 (×3): 10 mg via ORAL
  Filled 2017-12-10 (×4): qty 2

## 2017-12-10 NOTE — Progress Notes (Signed)
   12/10/17 1400  PT Visit Information  Last PT Received On 12/10/17  Pt Is progressing slowly, he was more sleepy this afternoon, likely d/t meds and RN is aware; continue PT POC  Assistance Needed +1  History of Present Illness Pt is a 72 y/o male s/p elective R TKA. PMH includes OSA, HTN, DM, and prostate cancer.   Precautions  Precautions Knee  Precaution Comments Reviewed knee precautions and supine HEP.   Required Braces or Orthoses Knee Immobilizer - Right  Knee Immobilizer - Right On except when in CPM  Restrictions  Weight Bearing Restrictions No  RLE Weight Bearing WBAT  Pain Assessment  Pain Assessment 0-10  Pain Location R knee   Pain Descriptors / Indicators Aching;Grimacing;Guarding;Sore  Pain Intervention(s) Monitored during session;Limited activity within patient's tolerance;Repositioned  Cognition  Arousal/Alertness Awake/alert (sleepy)  Behavior During Therapy [facility] for tasks assessed/performed  Following Commands Follows one step commands with increased time;Follows multi-step commands with increased time  Problem Solving Slow processing;Requires verbal cues  Bed Mobility  Overal bed mobility Needs Assistance  Bed Mobility Supine to Sit;Sit to Supine  Supine to sit Min assist  Sit to supine Min assist  General bed mobility comments incr time, assist for RLE, light assist with trunk and verbal cues for sequencing and to complete task  Transfers  General transfer comment deferred d/t pt more sleepy and difficulty maintaining arousal this pm  Balance  Sitting balance-Leahy Scale Fair  Total Joint Exercises  Ankle Circles/Pumps AROM;Both;20 reps  Quad Sets AROM;Both;10 reps;Limitations  Quad Sets Limitations falling asleep  PT - End of Session  Activity Tolerance Patient limited by lethargy  Patient left in bed;with call bell/phone within reach;with bed alarm set;with family/visitor present  Nurse Communication Mobility status   PT - Assessment/Plan  PT Plan  Current plan remains appropriate  PT Visit Diagnosis Unsteadiness on feet (R26.81);Other abnormalities of gait and mobility (R26.89);Pain  Pain - Right/Left Right  Pain - part of body Knee  PT Frequency (ACUTE ONLY) 7X/week  Follow Up Recommendations Supervision for mobility/OOB;Follow surgeon's recommendation for DC plan and follow-up therapies  PT equipment Rolling walker with 5" wheels  AM-PAC PT "6 Clicks" Daily Activity Outcome Measure  Difficulty turning over in bed (including adjusting bedclothes, sheets and blankets)? 3  Difficulty moving from lying on back to sitting on the side of the bed?  1  Difficulty sitting down on and standing up from a chair with arms (e.g., wheelchair, bedside commode, etc,.)? 1  Help needed moving to and from a bed to chair (including a wheelchair)? 2  Help needed walking in hospital room? 2  Help needed climbing 3-5 steps with a railing?  2  6 Click Score 11  Mobility G Code  CL  PT Goal Progression  Progress towards PT goals Progressing toward goals (slowly)  Acute Rehab PT Goals  PT Goal Formulation With patient  Time For Goal Achievement 12/23/17  Potential to Achieve Goals Good  PT Time Calculation  PT Start Time (ACUTE ONLY) 1325  PT Stop Time (ACUTE ONLY) 1344  PT Time Calculation (min) (ACUTE ONLY) 19 min  PT General Charges  $$ ACUTE PT VISIT 1 Visit  PT Treatments  $Therapeutic Activity 8-22 mins

## 2017-12-10 NOTE — Progress Notes (Signed)
   Subjective: 1 Day Post-Op Procedure(s) (LRB): RIGHT TOTAL KNEE ARTHROPLASTY-CEMENTED (Right) Patient reports pain as moderate.    Objective: Vital signs in last 24 hours: Temp:  [97.2 F (36.2 C)-99 F (37.2 C)] 99 F (37.2 C) (07/18 0401) Pulse Rate:  [58-79] 79 (07/18 0401) Resp:  [13-21] 15 (07/17 1624) BP: (102-127)/(62-71) 119/67 (07/18 0401) SpO2:  [92 %-100 %] 94 % (07/18 0401) Weight:  [248 lb (112.5 kg)] 248 lb (112.5 kg) (07/17 1044)  Intake/Output from previous day: 07/17 0701 - 07/18 0700 In: 2810.2 [P.O.:125; I.V.:2435.2; IV Piggyback:50] Out: 1700 [Urine:1650; Blood:50] Intake/Output this shift: No intake/output data recorded.  Recent Labs    12/10/17 0423  HGB 11.0*   Recent Labs    12/10/17 0423  WBC 8.1  RBC 3.30*  HCT 33.3*  PLT 177   Recent Labs    12/10/17 0423  NA 135  K 3.8  CL 103  CO2 24  BUN 14  CREATININE 1.24  GLUCOSE 200*  CALCIUM 8.6*   No results for input(s): LABPT, INR in the last 72 hours.  Neurologically intact Dg Knee 1-2 Views Right  Result Date: 12/09/2017 CLINICAL DATA:  Post RIGHT total knee arthroplasty EXAM: RIGHT KNEE - 1-2 VIEW COMPARISON:  Portable exam 1527 hours compared to 03/05/2007 and correlated with interval MRI of 12/30/2011 FINDINGS: Components of a RIGHT knee prosthesis are identified in expected positions. No acute fracture, dislocation, or bone destruction. Anterior soft tissue changes and skin clips from surgery. No periprosthetic lucency. IMPRESSION: RIGHT knee prosthesis without acute complication. Electronically Signed   By: Lavonia Dana M.D.   On: 12/09/2017 17:03    Assessment/Plan: 1 Day Post-Op Procedure(s) (LRB): RIGHT TOTAL KNEE ARTHROPLASTY-CEMENTED (Right) Up with therapy, dressing change.   [name] 12/10/2017, 8:00 AM

## 2017-12-10 NOTE — Plan of Care (Signed)
  Problem: Clinical Measurements: Goal: Ability to maintain clinical measurements within normal limits will improve Outcome: Progressing Goal: Will remain free from infection Outcome: Progressing Goal: Diagnostic test results will improve Outcome: Progressing   Problem: Activity: Goal: Risk for activity intolerance will decrease Outcome: Progressing   Problem: Coping: Goal: Level of anxiety will decrease Outcome: Progressing   Problem: Pain Managment: Goal: General experience of comfort will improve Outcome: Progressing   Problem: Safety: Goal: Ability to remain free from injury will improve Outcome: Progressing   Problem: Skin Integrity: Goal: Risk for impaired skin integrity will decrease Outcome: Progressing   

## 2017-12-10 NOTE — Progress Notes (Signed)
Pt is very groggy and sleepy today, he had taken narcotics every 4 hrs from last night up to this morning.  Responsive to verbal stimuli. Sat at the edge of the bed to urinate with mod x2 assist.  Dressing to right knee changed, incision with staples dry and intact,  Aquacell dressing applied.

## 2017-12-10 NOTE — Progress Notes (Signed)
Orthopedic Tech Progress Note Patient Details:  Johnny Huber May 17, 1946 [id number]  CPM Right Knee CPM Right Knee: On Right Knee Flexion (Degrees): 90 Right Knee Extension (Degrees): 0 Additional Comments: trapeze bar patient helper  Post Interventions Patient Tolerated: Well Instructions Provided: Care of device  [name] 12/10/2017, 5:22 AM

## 2017-12-10 NOTE — Progress Notes (Signed)
Pts family member stated that patient CPAP is in the room, however she states that patient is in too much pain to wear it tonight and is not even sleeping and to not worry about setting it up. And states that she hop[es they are discharged tomorrow. Advised pt to let me know if patient changed mind. RN aware.

## 2017-12-11 LAB — GLUCOSE, CAPILLARY
GLUCOSE-CAPILLARY: 162 mg/dL — AB (ref 70–99)
GLUCOSE-CAPILLARY: 176 mg/dL — AB (ref 70–99)
GLUCOSE-CAPILLARY: 180 mg/dL — AB (ref 70–99)
Glucose-Capillary: 168 mg/dL — ABNORMAL HIGH (ref 70–99)

## 2017-12-11 LAB — CBC
HCT: 30.4 % — ABNORMAL LOW (ref 39.0–52.0)
HEMOGLOBIN: 10.2 g/dL — AB (ref 13.0–17.0)
MCH: 33.1 pg (ref 26.0–34.0)
MCHC: 33.6 g/dL (ref 30.0–36.0)
MCV: 98.7 fL (ref 78.0–100.0)
Platelets: 165 10*3/uL (ref 150–400)
RBC: 3.08 MIL/uL — AB (ref 4.22–5.81)
RDW: 12.4 % (ref 11.5–15.5)
WBC: 8.6 10*3/uL (ref 4.0–10.5)

## 2017-12-11 MED ORDER — HYDROCODONE-ACETAMINOPHEN 5-325 MG PO TABS: 1.0000 | ORAL_TABLET | Freq: Four times a day (QID) | ORAL | Status: DC | PRN

## 2017-12-11 MED ORDER — ASPIRIN 325 MG PO TBEC: 325.0000 mg | DELAYED_RELEASE_TABLET | Freq: Every day | ORAL | 0 refills | Status: DC

## 2017-12-11 MED ORDER — HYDROCODONE-ACETAMINOPHEN 10-325 MG PO TABS: 1.0000 | ORAL_TABLET | Freq: Four times a day (QID) | ORAL | 0 refills | Status: DC | PRN

## 2017-12-11 MED ORDER — TRAMADOL HCL 50 MG PO TABS: 50.0000 mg | ORAL_TABLET | Freq: Four times a day (QID) | ORAL | Status: DC | PRN

## 2017-12-11 NOTE — Progress Notes (Signed)
Orthopedic Tech Progress Note Patient Details:  Johnny Huber 1946/02/06 [id number]  CPM Right Knee CPM Right Knee: On Right Knee Flexion (Degrees): 90 Right Knee Extension (Degrees): 0 Additional Comments: Immobilizer on  Post Interventions Patient Tolerated: Well Instructions Provided: Care of device  [name] 12/11/2017, 5:29 PM

## 2017-12-11 NOTE — Progress Notes (Signed)
Pt has home cpap at bedside and pt family member refused set up for pt and stated that he is just fine on the nasal canula. Advised pt to call if needed help later.

## 2017-12-11 NOTE — Plan of Care (Signed)
  Problem: Activity: Goal: Risk for activity intolerance will decrease Outcome: Progressing   Problem: Pain Managment: Goal: General experience of comfort will improve Outcome: Progressing   

## 2017-12-11 NOTE — Plan of Care (Signed)

## 2017-12-11 NOTE — Discharge Summary (Addendum)
Patient ID: CAS TRACZ MRN: [id number] DOB/AGE: 1945/08/08 72 y.o.  Admit date: 12/09/2017 Discharge date: 12/12/2017  Admission Diagnoses:  Active Problems:   Unilateral primary osteoarthritis, right knee   Arthritis of right knee   Discharge Diagnoses:  Active Problems:   Unilateral primary osteoarthritis, right knee   Arthritis of right knee  status post Procedure(s): RIGHT TOTAL KNEE ARTHROPLASTY-CEMENTED  Past Medical History:  Diagnosis Date  . Asthmatic bronchitis    as a child  . Coronary atherosclerosis of native coronary artery    BMS to RCA 2015  . DDD (degenerative disc disease), lumbar   . Diabetes ([place])   . Gastroesophageal reflux disease   . Hypertension   . Insomnia, unspecified   . Obstructive sleep apnea    uses cpap  . Pneumonia   . Prostate cancer [facility])    had prostate removed  . Restless legs syndrome   . Rhinitis, allergic     Surgeries: Procedure(s): RIGHT TOTAL KNEE ARTHROPLASTY-CEMENTED on 12/09/2017   Consultants:   Discharged Condition: Improved  Hospital Course: DARYAN BUELL is an 72 y.o. male who was admitted 12/09/2017 for operative treatment of right knee DJD. Patient failed conservative treatments (please see the history and physical for the specifics) and had severe unremitting pain that affects sleep, daily activities and work/hobbies. After pre-op clearance, the patient was taken to the operating room on 12/09/2017 and underwent  Procedure(s): RIGHT TOTAL KNEE ARTHROPLASTY-CEMENTED.    Patient was given perioperative antibiotics:  Anti-infectives (From admission, onward)   Start     Dose/Rate Route Frequency Ordered Stop   12/09/17 2000  ceFAZolin (ANCEF) IVPB 1 g/50 mL premix     1 g 100 mL/hr over 30 Minutes Intravenous Every 8 hours 12/09/17 1519 12/10/17 0626   12/09/17 1200  ceFAZolin (ANCEF) IVPB 2g/100 mL premix     2 g 200 mL/hr over 30 Minutes Intravenous To [facility] 12/08/17 0926 12/09/17 1240        Patient was given sequential compression devices and early ambulation to prevent DVT.   Patient benefited maximally from hospital stay and there were no complications. At the time of discharge, the patient was urinating/moving their bowels without difficulty, tolerating a regular diet, pain is controlled with oral pain medications and they have been cleared by PT/OT.   Recent vital signs:  Patient Vitals for the past 24 hrs:  BP Temp Temp src Pulse Resp SpO2  12/12/17 0359 (!) 130/56 98.2 F (36.8 C) - 77 - 94 %  12/11/17 2007 127/69 98.8 F (37.1 C) Oral 82 - 95 %  12/11/17 1210 121/64 99.1 F (37.3 C) Oral 81 18 92 %     Recent laboratory studies:  Recent Labs    12/10/17 0423 12/11/17 0429 12/12/17 0344  WBC 8.1 8.6 7.4  HGB 11.0* 10.2* 9.9*  HCT 33.3* 30.4* 29.0*  PLT 177 165 148*  NA 135  --   --   K 3.8  --   --   CL 103  --   --   CO2 24  --   --   BUN 14  --   --   CREATININE 1.24  --   --   GLUCOSE 200*  --   --   CALCIUM 8.6*  --   --      Discharge Medications:   Allergies as of 12/12/2017   No Known Allergies     Medication List    STOP taking these  medications   glucose monitoring kit monitoring kit     TAKE these medications   albuterol 108 (90 Base) MCG/ACT inhaler Commonly known as:  PROVENTIL HFA;VENTOLIN HFA Inhale into the lungs every 6 (six) hours as needed for wheezing or shortness of breath.   amLODipine 10 MG tablet Commonly known as:  NORVASC Take 10 mg by mouth daily.   aspirin 325 MG EC tablet Take 1 tablet (325 mg total) by mouth daily with breakfast. What changed:    medication strength  how much to take  when to take this   atorvastatin 80 MG tablet Commonly known as:  LIPITOR TAKE 1 TABLET DAILY (DOSE INCREASE)   BD VEO INSULIN SYRINGE U/F 31G X 15/64" 0.3 ML Misc Generic drug:  Insulin Syringe-Needle U-100 USE 1 THREE TIMES A DAY WITH MEALS   BD VEO INSULIN SYRINGE U/F 31G X 15/64" 0.3 ML Misc Generic  drug:  Insulin Syringe-Needle U-100   cetirizine 10 MG tablet Commonly known as:  ZYRTEC Take 10 mg by mouth daily.   citalopram 20 MG tablet Commonly known as:  CELEXA Take 20 mg by mouth daily.   glucose blood test strip Commonly known as:  FREESTYLE LITE Use as instructed bid   HYDROcodone-acetaminophen 10-325 MG tablet Commonly known as:  NORCO Take 1 tablet by mouth every 6 (six) hours as needed.   Insulin Degludec 200 UNIT/ML Sopn Commonly known as:  TRESIBA FLEXTOUCH Inject 20 Units into the skin at bedtime. What changed:  when to take this   Insulin Pen Needle 31G X 8 MM Misc Commonly known as:  B-D ULTRAFINE III SHORT PEN 1 each by Does not apply route as directed.   losartan-hydrochlorothiazide 100-25 MG tablet Commonly known as:  HYZAAR Take 1 tablet by mouth daily.   metFORMIN 500 MG tablet Commonly known as:  GLUCOPHAGE Take 500 mg by mouth 2 (two) times daily with a meal.   mometasone 50 MCG/ACT nasal spray Commonly known as:  NASONEX Place 2 sprays into the nose daily.   nitroGLYCERIN 0.4 MG SL tablet Commonly known as:  NITROSTAT Place 0.4 mg under the tongue every 5 (five) minutes as needed for chest pain.   OZEMPIC 0.25 or 0.5 MG/DOSE Sopn Generic drug:  Semaglutide Inject 0.'25mg'$  once weekly for 4 weeks, then increase to 0.'5mg'$  once weekly if tolerating   rOPINIRole 1 MG tablet Commonly known as:  REQUIP Take 1 mg by mouth 2 (two) times daily.   sitaGLIPtin 100 MG tablet Commonly known as:  JANUVIA Take 100 mg by mouth daily.   Vitamin D3 1000 units Caps Take 1,000 Units by mouth daily.       Diagnostic Studies: Dg Chest 2 View  Result Date: 11/27/2017 CLINICAL DATA:  Preoperative evaluation for total knee replacement EXAM: CHEST - 2 VIEW COMPARISON:  March 23, 2014 FINDINGS: Lungs are clear. The heart size and pulmonary vascularity are normal. No adenopathy. No evident lesions. IMPRESSION: No edema or consolidation. Electronically  Signed   By: Lowella Grip III M.D.   On: 11/27/2017 10:36   Dg Knee 1-2 Views Right  Result Date: 12/09/2017 CLINICAL DATA:  Post RIGHT total knee arthroplasty EXAM: RIGHT KNEE - 1-2 VIEW COMPARISON:  Portable exam 1527 hours compared to 03/05/2007 and correlated with interval MRI of 12/30/2011 FINDINGS: Components of a RIGHT knee prosthesis are identified in expected positions. No acute fracture, dislocation, or bone destruction. Anterior soft tissue changes and skin clips from surgery. No periprosthetic lucency. IMPRESSION: RIGHT  knee prosthesis without acute complication. Electronically Signed   By: Lavonia Dana M.D.   On: 12/09/2017 17:03      Follow-up Information    [name], MD. Schedule an appointment as soon as possible for a visit.   Specialty:  Orthopedic Surgery Why:  need return office visit 2 weeks postop Contact information: [place] [address] [phone]           Discharge Plan:  discharge to snf  Disposition:     Signed: Aundra Dubin for Rodell Perna MD 12/12/2017, 8:28 AM

## 2017-12-11 NOTE — Progress Notes (Signed)
Patient has home CPAP at bedside refused to wear it.

## 2017-12-11 NOTE — Progress Notes (Signed)
Physical Therapy Treatment Patient Details Name: Johnny Huber MRN: [id number] DOB: Jul 18, 1945 Today's Date: 12/11/2017    History of Present Illness Pt is a 72 y/o male s/p elective R TKA. PMH includes OSA, HTN, DM, and prostate cancer.     PT Comments    Pt performed gait training and functional mobility. Requires min guard to minimal assistance at this time.  Based on patient safety concerns and strength deficits his is appropriate for Short Term Rehab at SNF until his strength improves.  Plan next session for continued functional mobility and progression of therapeutic exercises.    Follow Up Recommendations  Supervision for mobility/OOB;Follow surgeon's recommendation for DC plan and follow-up therapies     Equipment Recommendations  Rolling walker with 5" wheels    Recommendations for Other Services       Precautions / Restrictions Precautions Precautions: Knee Precaution Booklet Issued: Yes (comment) Precaution Comments: Reviewed knee precautions and supine HEP.  Required Braces or Orthoses: Knee Immobilizer - Right Knee Immobilizer - Right: On except when in CPM Restrictions Weight Bearing Restrictions: Yes RLE Weight Bearing: Weight bearing as tolerated    Mobility  Bed Mobility Overal bed mobility: Needs Assistance Bed Mobility: Sit to Supine       Sit to supine: Min assist   General bed mobility comments: Pt sitting on edge of bed on arrival, patient required cues for using hooking method to assist RLE back to bed with LLE.  Once in bed min assistance to position RLE.    Transfers Overall transfer level: Needs assistance Equipment used: Rolling walker (2 wheeled) Transfers: Sit to/from Stand Sit to Stand: Min guard         General transfer comment: Cues for hand placement to and from seated surface.  Pt slow and guarded but able to perform without lift assistance min guard provided for safety.    Ambulation/Gait Ambulation/Gait assistance: Min  assist Gait Distance (Feet): 40 Feet Assistive device: Rolling walker (2 wheeled) Gait Pattern/deviations: Step-to pattern;Decreased stride length;Shuffle;Decreased stance time - right;Antalgic Gait velocity: Decreased    General Gait Details: Max VCs for sequencing, cues for RW safety, knee extension on R in stance phase, upper trunk control.  Buckling remains when patient sequencing incorrectly.  Pt with poor safety awareness.     Stairs             Wheelchair Mobility    Modified Rankin (Stroke Patients Only)       Balance                                            Cognition Arousal/Alertness: Awake/alert(sleepy) Behavior During Therapy: WFL for tasks assessed/performed Overall Cognitive Status: Impaired/Different from baseline Area of Impairment: Following commands;Problem solving                       Following Commands: Follows one step commands with increased time;Follows multi-step commands with increased time     Problem Solving: Slow processing;Requires verbal cues General Comments: pt with delayed processing, sleepy but arouses easily (wife reports he is foggy d/t meds and it was worse last night)      Exercises Total Joint Exercises Ankle Circles/Pumps: AROM;Both;20 reps;Supine Quad Sets: AROM;10 reps;Right;Supine Heel Slides: Right;10 reps;AAROM;Supine    General Comments        Pertinent Vitals/Pain Pain Assessment: 0-10 Pain Score: 8  Pain Location: R knee  Pain Descriptors / Indicators: Aching;Grimacing;Guarding;Sore Pain Intervention(s): Monitored during session;Repositioned;Ice applied    Home Living                      Prior Function            PT Goals (current goals can now be found in the care plan section) Acute Rehab PT Goals Patient Stated Goal: "to go home tomorrow morning" Potential to Achieve Goals: Good Progress towards PT goals: Progressing toward goals    Frequency     7X/week      PT Plan Current plan remains appropriate    Co-evaluation              AM-PAC PT "6 Clicks" Daily Activity  Outcome Measure  Difficulty turning over in bed (including adjusting bedclothes, sheets and blankets)?: A Little Difficulty moving from lying on back to sitting on the side of the bed? : Unable Difficulty sitting down on and standing up from a chair with arms (e.g., wheelchair, bedside commode, etc,.)?: Unable Help needed moving to and from a bed to chair (including a wheelchair)?: A Lot Help needed walking in hospital room?: A Lot Help needed climbing 3-5 steps with a railing? : A Lot 6 Click Score: 11    End of Session Equipment Utilized During Treatment: Gait belt Activity Tolerance: Patient limited by lethargy Patient left: in bed;with call bell/phone within reach;with bed alarm set;with family/visitor present Nurse Communication: Mobility status PT Visit Diagnosis: Unsteadiness on feet (R26.81);Other abnormalities of gait and mobility (R26.89);Pain Pain - Right/Left: Right Pain - part of body: Knee     Time: 7711-6579 PT Time Calculation (min) (ACUTE ONLY): 27 min  Charges:  $Gait Training: 8-22 mins $Therapeutic Exercise: 8-22 mins                    G Codes:       Johnny Huber, PTA pager [phone]    Cristela Blue 12/11/2017, 11:58 AM

## 2017-12-11 NOTE — Care Management Important Message (Signed)
Important Message  Patient Details  Name: Johnny Huber MRN: [id number] Date of Birth: [birth year]   Medicare Important Message Given:  Yes    Orbie Pyo 12/11/2017, 3:38 PM

## 2017-12-11 NOTE — Progress Notes (Signed)
Subjective: Wife present.  States that patient has been confused and drowsy since arrival to floor.  Moving slow with PT and wife states that he has been shaky when up.  no bowel movement yet.  Positive flatus. Denies N/V, abd pain.  They both agree that SNF would be better option.     Objective: Vital signs in last 24 hours: Temp:  [97.8 F (36.6 C)-99.5 F (37.5 C)] 99.5 F (37.5 C) (07/19 0419) Pulse Rate:  [64-84] 64 (07/19 0419) Resp:  [16-17] 16 (07/18 1529) BP: (127-161)/(71-82) 143/76 (07/19 0419) SpO2:  [90 %-99 %] 90 % (07/19 0419)  Intake/Output from previous day: 07/18 0701 - 07/19 0700 In: 1153.7 [P.O.:720; I.V.:433.7] Out: 1800 [Urine:1800] Intake/Output this shift: Total I/O In: -  Out: 400 [Urine:400]  Recent Labs    12/10/17 0423 12/11/17 0429  HGB 11.0* 10.2*   Recent Labs    12/10/17 0423 12/11/17 0429  WBC 8.1 8.6  RBC 3.30* 3.08*  HCT 33.3* 30.4*  PLT 177 165   Recent Labs    12/10/17 0423  NA 135  K 3.8  CL 103  CO2 24  BUN 14  CREATININE 1.24  GLUCOSE 200*  CALCIUM 8.6*   No results for input(s): LABPT, INR in the last 72 hours.  Exam: Patient awake but very drowsy when speaking to him.  Responds appropriately and follows commands. Right knee dressing C/D/I.  Calf nontender, NVI.  abd soft and nontender.        Assessment/Plan: With patient's slow progress SNF would be better option. I agree with patient and wife's decision.  Social work consult.  D/c dilaudid, oxycodone, oxycontin.     Benjiman Core 12/11/2017, 10:37 AM

## 2017-12-12 LAB — CBC
HEMATOCRIT: 29 % — AB (ref 39.0–52.0)
Hemoglobin: 9.9 g/dL — ABNORMAL LOW (ref 13.0–17.0)
MCH: 33.3 pg (ref 26.0–34.0)
MCHC: 34.1 g/dL (ref 30.0–36.0)
MCV: 97.6 fL (ref 78.0–100.0)
PLATELETS: 148 10*3/uL — AB (ref 150–400)
RBC: 2.97 MIL/uL — ABNORMAL LOW (ref 4.22–5.81)
RDW: 12.3 % (ref 11.5–15.5)
WBC: 7.4 10*3/uL (ref 4.0–10.5)

## 2017-12-12 LAB — GLUCOSE, CAPILLARY
GLUCOSE-CAPILLARY: 192 mg/dL — AB (ref 70–99)
GLUCOSE-CAPILLARY: 135 mg/dL — AB (ref 70–99)
Glucose-Capillary: 140 mg/dL — ABNORMAL HIGH (ref 70–99)
Glucose-Capillary: 185 mg/dL — ABNORMAL HIGH (ref 70–99)
Glucose-Capillary: 186 mg/dL — ABNORMAL HIGH (ref 70–99)

## 2017-12-12 NOTE — Progress Notes (Signed)
Subjective: 3 Days Post-Op Procedure(s) (LRB): RIGHT TOTAL KNEE ARTHROPLASTY-CEMENTED (Right) Patient reports pain as mild.  Slightly nauseous.  No vomiting. Otherwise, appears to be doing well.  Objective: Vital signs in last 24 hours: Temp:  [98.2 F (36.8 C)-99.1 F (37.3 C)] 98.2 F (36.8 C) (07/20 0359) Pulse Rate:  [77-82] 77 (07/20 0359) Resp:  [18] 18 (07/19 1210) BP: (121-130)/(56-69) 130/56 (07/20 0359) SpO2:  [92 %-95 %] 94 % (07/20 0359)  Intake/Output from previous day: 07/19 0701 - 07/20 0700 In: 240 [P.O.:240] Out: 1000 [Urine:1000] Intake/Output this shift: No intake/output data recorded.  Recent Labs    12/10/17 0423 12/11/17 0429 12/12/17 0344  HGB 11.0* 10.2* 9.9*   Recent Labs    12/11/17 0429 12/12/17 0344  WBC 8.6 7.4  RBC 3.08* 2.97*  HCT 30.4* 29.0*  PLT 165 148*   Recent Labs    12/10/17 0423  NA 135  K 3.8  CL 103  CO2 24  BUN 14  CREATININE 1.24  GLUCOSE 200*  CALCIUM 8.6*   No results for input(s): LABPT, INR in the last 72 hours.  Neurologically intact Neurovascular intact Sensation intact distally Intact pulses distally Dorsiflexion/Plantar flexion intact Incision: scant drainage No cellulitis present Compartment soft    Assessment/Plan: 3 Days Post-Op Procedure(s) (LRB): RIGHT TOTAL KNEE ARTHROPLASTY-CEMENTED (Right) Advance diet Up with therapy D/C IV fluids Discharge to SNF once bed available WBAT RLE ABLA-mild and stable    Aundra Dubin 12/12/2017, 8:27 AM

## 2017-12-12 NOTE — NC FL2 (Addendum)
[place] MEDICAID FL2 LEVEL OF CARE SCREENING TOOL     IDENTIFICATION  Patient Name: Johnny Huber Birthdate: 04-17-1946 Sex: male Admission Date (Current Location): 12/09/2017  [facility] and [place] Number:  Herbalist and Address:  [facility], [place] [address] [place], [place] [address]      Provider Number: [id number]  Attending Physician Name and Address:  [name], MD  Relative Name and Phone Number:       Current Level of Care: Hospital Recommended Level of Care: [place] Prior Approval Number: [id number]   Date Approved/Denied:   PASRR Number:    Discharge Plan: SNF    Current Diagnoses: Patient Active Problem List   Diagnosis Date Noted  . Arthritis of right knee 12/09/2017  . Unilateral primary osteoarthritis, right knee 03/05/2017  . Chronic right-sided low back pain with right-sided sciatica 01/01/2017  . Hypercholesteremia 03/11/2016  . Class 1 obesity due to excess calories with serious comorbidity and body mass index (BMI) of 34.0 to 34.9 in adult 03/11/2016  . ANEMIA 12/31/2009  . RECTAL BLEEDING 12/31/2009  . DM type 2 causing vascular disease ([place]) 04/12/2007  . DEGENERATIVE JOINT DISEASE, RIGHT KNEE 04/12/2007  . KNEE PAIN 04/12/2007  . Essential hypertension, benign 04/12/2007    Orientation RESPIRATION BLADDER Height & Weight     Self, Time, Situation, Place  Normal Continent Weight: 248 lb (112.5 kg) Height:  5' 10.98" (180.3 cm)  BEHAVIORAL SYMPTOMS/MOOD NEUROLOGICAL BOWEL NUTRITION STATUS      Continent Diet(Carb modified, thin liquids)  AMBULATORY STATUS COMMUNICATION OF NEEDS Skin   Limited Assist Verbally Surgical wounds(Closed incision right leg, silver hydrofiber dressing)                       Personal Care Assistance Level of Assistance  Bathing, Feeding, Dressing Bathing Assistance: Limited assistance Feeding assistance: Independent Dressing Assistance: Limited  assistance     Functional Limitations Info  Sight, Hearing, Speech Sight Info: Adequate Hearing Info: Adequate Speech Info: Adequate    SPECIAL CARE FACTORS FREQUENCY  PT (By licensed PT), OT (By licensed OT)     PT Frequency: 7x OT Frequency: 7x            Contractures Contractures Info: Not present    Additional Factors Info  Code Status, Allergies Code Status Info: Full Code Allergies Info: NO known allergies           Current Medications (12/12/2017):  This is the current hospital active medication list Current Facility-Administered Medications  Medication Dose Route Frequency Provider Last Rate Last Dose  . 0.9 %  sodium chloride infusion   Intravenous Continuous Lanae Crumbly, PA-C 85 mL/hr at 12/11/17 0125    . acetaminophen (TYLENOL) tablet 325-650 mg  325-650 mg Oral Q6H PRN Lanae Crumbly, PA-C   650 mg at 12/11/17 2315  . albuterol (PROVENTIL) (2.5 MG/3ML) 0.083% nebulizer solution 3 mL  3 mL Inhalation Q6H PRN Lanae Crumbly, PA-C      . amLODipine (NORVASC) tablet 10 mg  10 mg Oral Daily Lanae Crumbly, PA-C   10 mg at 12/12/17 0858  . aspirin EC tablet 325 mg  325 mg Oral Q breakfast Lanae Crumbly, PA-C   325 mg at 12/12/17 5732  . atorvastatin (LIPITOR) tablet 80 mg  80 mg Oral q1800 Lanae Crumbly, PA-C   80 mg at 12/11/17 1841  . citalopram (CELEXA) tablet 20 mg  20 mg Oral Daily Lanae Crumbly, PA-C   20 mg at 12/12/17 0857  . docusate sodium (COLACE) capsule 100 mg  100 mg Oral BID Lanae Crumbly, PA-C   100 mg at 12/12/17 1700  . fluticasone (FLONASE) 50 MCG/ACT nasal spray 1 spray  1 spray Each Nare Daily Lanae Crumbly, PA-C   1 spray at 12/11/17 0916  . losartan (COZAAR) tablet 100 mg  100 mg Oral Daily [name], MD   100 mg at 12/12/17 0857   And  . hydrochlorothiazide (HYDRODIURIL) tablet 25 mg  25 mg Oral Daily [name], MD   25 mg at 12/12/17 0858  . HYDROcodone-acetaminophen (NORCO/VICODIN) 5-325 MG per tablet 1 tablet  1 tablet  Oral Q6H PRN Lanae Crumbly, PA-C      . insulin aspart (novoLOG) injection 0-9 Units  0-9 Units Subcutaneous TID WC [name], MD   2 Units at 12/12/17 [phone]  . insulin glargine (LANTUS) injection 20 Units  20 Units Subcutaneous Daily [name], MD   20 Units at 12/12/17 [phone]  . lactated ringers infusion   Intravenous Continuous Annye Asa, MD 10 mL/hr at 12/09/17 1049    . loratadine (CLARITIN) tablet 10 mg  10 mg Oral Daily Lanae Crumbly, PA-C   10 mg at 12/12/17 0856  . menthol-cetylpyridinium (CEPACOL) lozenge 3 mg  1 lozenge Oral PRN Lanae Crumbly, PA-C       Or  . phenol (CHLORASEPTIC) mouth spray 1 spray  1 spray Mouth/Throat PRN Lanae Crumbly, PA-C      . metFORMIN (GLUCOPHAGE) tablet 500 mg  500 mg Oral BID WC Lanae Crumbly, PA-C   500 mg at 12/12/17 0857  . methocarbamol (ROBAXIN) tablet 500 mg  500 mg Oral Q6H PRN Lanae Crumbly, PA-C   500 mg at 12/12/17 0422   Or  . methocarbamol (ROBAXIN) 500 mg in dextrose 5 % 50 mL IVPB  500 mg Intravenous Q6H PRN Lanae Crumbly, PA-C      . metoCLOPramide (REGLAN) tablet 5-10 mg  5-10 mg Oral Q8H PRN Lanae Crumbly, PA-C       Or  . metoCLOPramide (REGLAN) injection 5-10 mg  5-10 mg Intravenous Q8H PRN Benjiman Core M, PA-C      . nitroGLYCERIN (NITROSTAT) SL tablet 0.4 mg  0.4 mg Sublingual Q5 min PRN Lanae Crumbly, PA-C      . ondansetron [facility]) tablet 4 mg  4 mg Oral Q6H PRN Lanae Crumbly, PA-C       Or  . ondansetron [facility]) injection 4 mg  4 mg Intravenous Q6H PRN Lanae Crumbly, PA-C   4 mg at 12/11/17 1646  . polyethylene glycol (MIRALAX / GLYCOLAX) packet 17 g  17 g Oral Daily PRN Lanae Crumbly, PA-C   17 g at 12/11/17 2314  . rOPINIRole (REQUIP) tablet 1 mg  1 mg Oral BID Lanae Crumbly, PA-C   1 mg at 12/12/17 7591     Discharge Medications: Please see discharge summary for a list of discharge medications.  Relevant Imaging Results:  Relevant Lab Results:   Additional Information SSN:  [id number]  Eileen Stanford, LCSW

## 2017-12-12 NOTE — Progress Notes (Addendum)
Physical Therapy Treatment Patient Details Name: Johnny Huber MRN: [id number] DOB: 11/05/1945 Today's Date: 12/12/2017    History of Present Illness Pt is a 72 y/o male s/p elective R TKA. PMH includes OSA, HTN, DM, and prostate cancer.     PT Comments    Pt lethargic on arrival to room, wife reports he has been this way since surgery. Educated wife on keeping lights on during the day and having the pt sit up regularly to help with level of arousal. Pt required assist with HEP secondary to poor quad strength. Utilized KI during ambulation for safety as pt's knee tends to buckle. Patient would benefit from continued skilled PT to maximize safety with mobility and activity tolerance. Will continue to follow acutely.     Follow Up Recommendations  Supervision for mobility/OOB;Follow surgeon's recommendation for DC plan and follow-up therapies     Equipment Recommendations  Rolling walker with 5" wheels    Recommendations for Other Services       Precautions / Restrictions Precautions Precautions: Knee Precaution Booklet Issued: Yes (comment) Precaution Comments: Reviewed knee precautions and supine HEP.  Required Braces or Orthoses: Knee Immobilizer - Right Knee Immobilizer - Right: On except when in CPM Restrictions Weight Bearing Restrictions: Yes RLE Weight Bearing: Weight bearing as tolerated    Mobility  Bed Mobility Overal bed mobility: Needs Assistance Bed Mobility: Sit to Supine     Supine to sit: Min guard;HOB elevated     General bed mobility comments: Close min guard with HOB elevated. Increased time and effort with use of bed rails. Pt with report of dizziness that resolved after sitting EOB ~2 min.  Transfers Overall transfer level: Needs assistance Equipment used: Rolling walker (2 wheeled) Transfers: Sit to/from Stand Sit to Stand: Min guard;From elevated surface         General transfer comment: Cues for hand placement. Min guard to rise from  elevated EOB and BSC. Very slow guarded movements.  Ambulation/Gait Ambulation/Gait assistance: Min assist Gait Distance (Feet): 30 Feet Assistive device: Rolling walker (2 wheeled) Gait Pattern/deviations: Step-to pattern;Decreased stride length;Decreased stance time - right;Antalgic Gait velocity: Decreased    General Gait Details: Utalized knee imobilizer during ambulation secondary to buckling in previous session. Cues for sequencing with RW and postural control.    Stairs             Wheelchair Mobility    Modified Rankin (Stroke Patients Only)       Balance Overall balance assessment: Needs assistance Sitting-balance support: No upper extremity supported;Feet supported Sitting balance-Leahy Scale: Fair     Standing balance support: Bilateral upper extremity supported;During functional activity Standing balance-Leahy Scale: Poor Standing balance comment: Reliant on BUE support.                             Cognition Arousal/Alertness: Awake/alert(sleepy) Behavior During Therapy: WFL for tasks assessed/performed Overall Cognitive Status: Impaired/Different from baseline Area of Impairment: Following commands;Problem solving                       Following Commands: Follows one step commands with increased time;Follows multi-step commands with increased time     Problem Solving: Slow processing;Requires verbal cues General Comments: Pt with delayed processing and sleepy throughout session. Wife present reports he has been this way since surgery and pain meds have been altered with only a slight improvement in arousal. Educated wife and pt on keeping  lights on during the day and sitting up to assist with level of arousal.      Exercises Total Joint Exercises Short Arc Quad: AAROM;Right;10 reps Hip ABduction/ADduction: AROM;Right;10 reps;Supine Straight Leg Raises: AAROM;Right;10 reps    General Comments General comments (skin integrity,  edema, etc.): wife present      Pertinent Vitals/Pain Pain Assessment: Faces Faces Pain Scale: Hurts a little bit Pain Location: R knee  Pain Descriptors / Indicators: Aching;Grimacing;Guarding;Sore Pain Intervention(s): Monitored during session;Limited activity within patient's tolerance;Repositioned    Home Living                      Prior Function            PT Goals (current goals can now be found in the care plan section) Acute Rehab PT Goals Patient Stated Goal: "to go home tomorrow morning" PT Goal Formulation: With patient Time For Goal Achievement: 12/23/17 Potential to Achieve Goals: Good Progress towards PT goals: Progressing toward goals    Frequency    7X/week      PT Plan Current plan remains appropriate    Co-evaluation              AM-PAC PT "6 Clicks" Daily Activity  Outcome Measure  Difficulty turning over in bed (including adjusting bedclothes, sheets and blankets)?: Unable Difficulty moving from lying on back to sitting on the side of the bed? : Unable Difficulty sitting down on and standing up from a chair with arms (e.g., wheelchair, bedside commode, etc,.)?: Unable Help needed moving to and from a bed to chair (including a wheelchair)?: A Little Help needed walking in hospital room?: A Little Help needed climbing 3-5 steps with a railing? : A Lot 6 Click Score: 11    End of Session Equipment Utilized During Treatment: Gait belt Activity Tolerance: Patient limited by lethargy Patient left: with call bell/phone within reach;with family/visitor present Nurse Communication: Mobility status PT Visit Diagnosis: Unsteadiness on feet (R26.81);Other abnormalities of gait and mobility (R26.89);Pain Pain - Right/Left: Right Pain - part of body: Knee     Time: 7622-6333 PT Time Calculation (min) (ACUTE ONLY): 30 min  Charges:  $Gait Training: 8-22 mins $Therapeutic Exercise: 8-22 mins                    G Codes:        Benjiman Core, [place] Pager [phone] Acute Rehab   Allena Katz 12/12/2017, 11:53 AM

## 2017-12-12 NOTE — Progress Notes (Signed)
Patient has home CPAP that he is comfortably placing on himself. RT informed patient to have RN call RT if assistance is needed. RT will monitor as needed.

## 2017-12-12 NOTE — Clinical Social Work Note (Signed)
Clinical Social Work Assessment  Patient Details  Name: Johnny Huber MRN: [id number] Date of Birth: [birth year]  Date of referral:  12/12/17               Reason for consult:  Facility Placement                Permission sought to share information with:  Family Supports Permission granted to share information::  Yes, Verbal Permission Granted  Name::     Tax adviser::  [facility] in [place]  Relationship::  Spouse  Contact Information:  [phone]  Housing/Transportation Living arrangements for the past 2 months:  [place] of Information:  Patient, Spouse Patient Interpreter Needed:  None Criminal Activity/Legal Involvement Pertinent to Current Situation/Hospitalization:  No - Comment as needed Significant Relationships:  Spouse Lives with:  Spouse Do you feel safe going back to the place where you live?  No Need for family participation in patient care:  No (Coment)  Care giving concerns:  Pt is alert and oriented. Pt's spouse present at bedside. Pt was living independently with spouse prior to admission.   Social Worker assessment / plan:  CSW spoke with pt and pt's spouse at bedside. Pt is agreeable to SNF at d/c. Pt and pt's spouse prefer [facility]. CSW will follow up with facility regarding bed availability.  Employment status:  Retired Forensic scientist:  Medicare PT Recommendations:  [place] / Referral to community resources:  [facility] [place]  Patient/Family's Response to care:  Pt verbalized understanding of CSW role and expressed appreciation for support. Pt denies any concern regarding pt care at this time.   Patient/Family's Understanding of and Emotional Response to Diagnosis, Current Treatment, and Prognosis:  Pt understanding and realistic regarding physical limitations. Pt understands the need for SNF placement at d/c. Pt agreeable to SNF placement at d/c, at this time. Pt's responses  emotionally appropriate during conversation with CSW. Pt denies any concern regarding treatment plan at this time. CSW will continue to provide support and facilitate d/c needs.   Emotional Assessment Appearance:  Appears stated age Attitude/Demeanor/Rapport:  (Patient was appropriate) Affect (typically observed):  Accepting, Appropriate, Calm Orientation:  Oriented to Self, Oriented to  Time, Oriented to Place, Oriented to Situation Alcohol / Substance use:  Not Applicable Psych involvement (Current and /or in the community):  No (Comment)  Discharge Needs  Concerns to be addressed:  Basic Needs, Care Coordination Readmission within the last 30 days:  No Current discharge risk:  Dependent with Mobility Barriers to Discharge:  Continued Medical Work up   [employer], LCSW 12/12/2017, 4:50 PM

## 2017-12-12 NOTE — Plan of Care (Signed)
  Problem: Activity: Goal: Risk for activity intolerance will decrease Outcome: Progressing   Problem: Elimination: Goal: Will not experience complications related to bowel motility Outcome: Progressing   

## 2017-12-13 LAB — GLUCOSE, CAPILLARY
GLUCOSE-CAPILLARY: 174 mg/dL — AB (ref 70–99)
Glucose-Capillary: 137 mg/dL — ABNORMAL HIGH (ref 70–99)

## 2017-12-13 NOTE — Clinical Social Work Placement (Signed)
   CLINICAL SOCIAL WORK PLACEMENT  NOTE  Date:  12/13/2017  Patient Details  Name: Johnny Huber MRN: [id number] Date of Birth: February 22, 1946  Clinical Social Work is seeking post-discharge placement for this patient at the   level of care (*CSW will initial, date and re-position this form in  chart as items are completed):      Patient/family provided with [facility] Work Department's list of facilities offering this level of care within the geographic area requested by the patient (or if unable, by the patient's family).  Yes   Patient/family informed of their freedom to choose among providers that offer the needed level of care, that participate in Medicare, Medicaid or managed care program needed by the patient, have an available bed and are willing to accept the patient.      Patient/family informed of [facility]'s ownership interest in [facility] and [facility], as well as of the fact that they are under no obligation to receive care at these facilities.  PASRR submitted to EDS on       PASRR number received on       Existing PASRR number confirmed on       FL2 transmitted to all facilities in geographic area requested by pt/family on       FL2 transmitted to all facilities within larger geographic area on       Patient informed that his/her managed care company has contracts with or will negotiate with certain facilities, including the following:        Yes   Patient/family informed of bed offers received.  Patient [facility] bed at [facility]     Physician recommends and patient chooses bed at      Patient to be transferred to [facility] on 12/13/17.  Patient to be transferred to facility by [facility]     Patient family notified on 12/13/17 of transfer.  Name of family member notified:  [name] [phone]     PHYSICIAN Please prepare prescriptions, Please prepare priority discharge summary, including medications, Please sign FL2      Additional Comment:    _______________________________________________ Eileen Stanford, LCSW 12/13/2017, 10:53 AM

## 2017-12-13 NOTE — Clinical Social Work Note (Addendum)
[facility] has a bed for pt today. MD please update d/c summary and date.   Loletha Grayer, MSW [phone]

## 2017-12-13 NOTE — Clinical Social Work Note (Signed)
Clinical Social Worker facilitated patient discharge including contacting patient family and facility to confirm patient discharge plans.  Clinical information faxed to facility and family agreeable with plan.  CSW arranged ambulance transport via [facility] to [place] .  RN to call [phone]  for report prior to discharge.  Clinical Social Worker will sign off for now as social work intervention is no longer needed. Please consult [place] again if new need arises.  Loletha Grayer, MSW [phone]

## 2017-12-13 NOTE — Discharge Summary (Signed)
Physician Discharge Summary      Patient ID: Johnny Huber MRN: [id number] DOB/AGE: Oct 15, 1945 72 y.o.  Admit date: 12/09/2017 Discharge date: 12/13/2017  Admission Diagnoses:  <principal problem not specified>  Discharge Diagnoses:  Active Problems:   Unilateral primary osteoarthritis, right knee   Arthritis of right knee   Past Medical History:  Diagnosis Date  . Asthmatic bronchitis    as a child  . Coronary atherosclerosis of native coronary artery    BMS to RCA 2015  . DDD (degenerative disc disease), lumbar   . Diabetes ([place])   . Gastroesophageal reflux disease   . Hypertension   . Insomnia, unspecified   . Obstructive sleep apnea    uses cpap  . Pneumonia   . Prostate cancer [facility])    had prostate removed  . Restless legs syndrome   . Rhinitis, allergic     Surgeries: Procedure(s): RIGHT TOTAL KNEE ARTHROPLASTY-CEMENTED on 12/09/2017   Consultants (if any):   Discharged Condition: Improved  Hospital Course: Johnny Huber is an 72 y.o. male who was admitted 12/09/2017 with a diagnosis of <principal problem not specified> and went to the operating room on 12/09/2017 and underwent the above named procedures.    He was given perioperative antibiotics:  Anti-infectives (From admission, onward)   Start     Dose/Rate Route Frequency Ordered Stop   12/09/17 2000  ceFAZolin (ANCEF) IVPB 1 g/50 mL premix     1 g 100 mL/hr over 30 Minutes Intravenous Every 8 hours 12/09/17 1519 12/10/17 0626   12/09/17 1200  ceFAZolin (ANCEF) IVPB 2g/100 mL premix     2 g 200 mL/hr over 30 Minutes Intravenous To [facility] 12/08/17 0926 12/09/17 1240    .  He was given sequential compression devices, early ambulation, and aspirin for DVT prophylaxis.  He benefited maximally from the hospital stay and there were no complications.    Recent vital signs:  Vitals:   12/12/17 2340 12/13/17 0426  BP:  (!) 135/58  Pulse: 78 74  Resp: 16   Temp:  98 F (36.7 C)    SpO2: 95% 90%    Recent laboratory studies:  Lab Results  Component Value Date   HGB 9.9 (L) 12/12/2017   HGB 10.2 (L) 12/11/2017   HGB 11.0 (L) 12/10/2017   Lab Results  Component Value Date   WBC 7.4 12/12/2017   PLT 148 (L) 12/12/2017   Lab Results  Component Value Date   INR 1.00 11/27/2017   Lab Results  Component Value Date   NA 135 12/10/2017   K 3.8 12/10/2017   CL 103 12/10/2017   CO2 24 12/10/2017   BUN 14 12/10/2017   CREATININE 1.24 12/10/2017   GLUCOSE 200 (H) 12/10/2017    Discharge Medications:   Allergies as of 12/13/2017   No Known Allergies     Medication List    STOP taking these medications   glucose monitoring kit monitoring kit     TAKE these medications   albuterol 108 (90 Base) MCG/ACT inhaler Commonly known as:  PROVENTIL HFA;VENTOLIN HFA Inhale into the lungs every 6 (six) hours as needed for wheezing or shortness of breath.   amLODipine 10 MG tablet Commonly known as:  NORVASC Take 10 mg by mouth daily.   aspirin 325 MG EC tablet Take 1 tablet (325 mg total) by mouth daily with breakfast. What changed:    medication strength  how much to take  when to take this  atorvastatin 80 MG tablet Commonly known as:  LIPITOR TAKE 1 TABLET DAILY (DOSE INCREASE)   BD VEO INSULIN SYRINGE U/F 31G X 15/64" 0.3 ML Misc Generic drug:  Insulin Syringe-Needle U-100 USE 1 THREE TIMES A DAY WITH MEALS   BD VEO INSULIN SYRINGE U/F 31G X 15/64" 0.3 ML Misc Generic drug:  Insulin Syringe-Needle U-100   cetirizine 10 MG tablet Commonly known as:  ZYRTEC Take 10 mg by mouth daily.   citalopram 20 MG tablet Commonly known as:  CELEXA Take 20 mg by mouth daily.   glucose blood test strip Commonly known as:  FREESTYLE LITE Use as instructed bid   HYDROcodone-acetaminophen 10-325 MG tablet Commonly known as:  NORCO Take 1 tablet by mouth every 6 (six) hours as needed.   Insulin Degludec 200 UNIT/ML Sopn Commonly known as:  TRESIBA  FLEXTOUCH Inject 20 Units into the skin at bedtime. What changed:  when to take this   Insulin Pen Needle 31G X 8 MM Misc Commonly known as:  B-D ULTRAFINE III SHORT PEN 1 each by Does not apply route as directed.   losartan-hydrochlorothiazide 100-25 MG tablet Commonly known as:  HYZAAR Take 1 tablet by mouth daily.   metFORMIN 500 MG tablet Commonly known as:  GLUCOPHAGE Take 500 mg by mouth 2 (two) times daily with a meal.   mometasone 50 MCG/ACT nasal spray Commonly known as:  NASONEX Place 2 sprays into the nose daily.   nitroGLYCERIN 0.4 MG SL tablet Commonly known as:  NITROSTAT Place 0.4 mg under the tongue every 5 (five) minutes as needed for chest pain.   OZEMPIC 0.25 or 0.5 MG/DOSE Sopn Generic drug:  Semaglutide Inject 0.'25mg'$  once weekly for 4 weeks, then increase to 0.'5mg'$  once weekly if tolerating   rOPINIRole 1 MG tablet Commonly known as:  REQUIP Take 1 mg by mouth 2 (two) times daily.   sitaGLIPtin 100 MG tablet Commonly known as:  JANUVIA Take 100 mg by mouth daily.   Vitamin D3 1000 units Caps Take 1,000 Units by mouth daily.       Diagnostic Studies: Dg Chest 2 View  Result Date: 11/27/2017 CLINICAL DATA:  Preoperative evaluation for total knee replacement EXAM: CHEST - 2 VIEW COMPARISON:  March 23, 2014 FINDINGS: Lungs are clear. The heart size and pulmonary vascularity are normal. No adenopathy. No evident lesions. IMPRESSION: No edema or consolidation. Electronically Signed   By: Lowella Grip III M.D.   On: 11/27/2017 10:36   Dg Knee 1-2 Views Right  Result Date: 12/09/2017 CLINICAL DATA:  Post RIGHT total knee arthroplasty EXAM: RIGHT KNEE - 1-2 VIEW COMPARISON:  Portable exam 1527 hours compared to 03/05/2007 and correlated with interval MRI of 12/30/2011 FINDINGS: Components of a RIGHT knee prosthesis are identified in expected positions. No acute fracture, dislocation, or bone destruction. Anterior soft tissue changes and skin clips from  surgery. No periprosthetic lucency. IMPRESSION: RIGHT knee prosthesis without acute complication. Electronically Signed   By: Lavonia Dana M.D.   On: 12/09/2017 17:03    Disposition: Discharge disposition: 03-Skilled [place] information for follow-up providers    [name], MD. Schedule an appointment as soon as possible for a visit.   Specialty:  Orthopedic Surgery Why:  need return office visit 2 weeks postop Contact information: [place] [address] [phone]            Contact information for after-discharge care  Destination    [facility] Preferred SNF .   Service:  Skilled Nursing Contact information: [address] [place] [phone]                   Signed: Eduard Roux 12/13/2017, 10:46 AM

## 2017-12-13 NOTE — Progress Notes (Signed)
   Subjective:  Patient reports pain as mild.  Doing well.  Ready to go to SNF  Objective:   VITALS:   Vitals:   12/12/17 1333 12/12/17 2000 12/12/17 2340 12/13/17 0426  BP: 110/64 122/66  (!) 135/58  Pulse: 76 75 78 74  Resp:   16   Temp: 97.7 F (36.5 C) 98.8 F (37.1 C)  98 F (36.7 C)  TempSrc: Oral Oral  Oral  SpO2: 92% 93% 95% 90%  Weight:      Height:        Exam stable   Lab Results  Component Value Date   WBC 7.4 12/12/2017   HGB 9.9 (L) 12/12/2017   HCT 29.0 (L) 12/12/2017   MCV 97.6 12/12/2017   PLT 148 (L) 12/12/2017     Assessment/Plan:  4 Days Post-Op   - Expected postop acute blood loss anemia - will monitor for symptoms - Up with PT/OT - DVT ppx - SCDs, ambulation, aspirin - WBAT operative extremity - Pain control - Discharge planning - SNF today  Johnny Huber 12/13/2017, 10:45 AM [phone]

## 2017-12-15 ENCOUNTER — Telehealth (INDEPENDENT_AMBULATORY_CARE_PROVIDER_SITE_OTHER): Payer: Self-pay | Admitting: Orthopaedic Surgery

## 2017-12-15 NOTE — Telephone Encounter (Signed)
He had aquacell on when last seen. Just leave it on and do not change it thanks

## 2017-12-15 NOTE — Telephone Encounter (Signed)
I called Johnny Huber and advised to leave dressing on. She also had questions about weightbearing status and utilization of the knee immobilizer patient was brought to the facility in.  Per Dr. Lorin Mercy, [facility].  PT discretion as far as knee immobilizer. If patient is safer ambulating with it until increased quad strength, he can continue to use it and D/C once quads are stronger.  Otherwise, total knee rehab protocol.  Johnny Huber expressed understanding.

## 2017-12-15 NOTE — Telephone Encounter (Signed)
Johnny Huber Harry called from the [facility] needing to verify wound care orders. Someone advised her to change it every 3 days but the orders were not in the discharge and she wants to verify before doing anything. Please give her a call as soon as possible # [phone]

## 2017-12-15 NOTE — Telephone Encounter (Signed)
Please advise. Patient had TKA on 12/09/17.  He has first post op follow up appt on 12/24/17 in [place].  Did you want for the rehab facility to change bandages or hold until seen in office?

## 2017-12-21 ENCOUNTER — Telehealth (INDEPENDENT_AMBULATORY_CARE_PROVIDER_SITE_OTHER): Payer: Self-pay | Admitting: Orthopaedic Surgery

## 2017-12-21 NOTE — Telephone Encounter (Signed)
Ok thx.

## 2017-12-21 NOTE — Telephone Encounter (Signed)
Ok to do

## 2017-12-21 NOTE — Telephone Encounter (Signed)
Debbie (PT) with [facility] called needing verbal orders for HHPT 1 wk 1 and 3 wk 2  The number to contact Jackelyn Poling is [phone]

## 2017-12-22 NOTE — Telephone Encounter (Signed)
IC LMVM advised orders are ok

## 2017-12-24 ENCOUNTER — Encounter (INDEPENDENT_AMBULATORY_CARE_PROVIDER_SITE_OTHER): Payer: Self-pay | Admitting: Orthopaedic Surgery

## 2017-12-24 ENCOUNTER — Ambulatory Visit (INDEPENDENT_AMBULATORY_CARE_PROVIDER_SITE_OTHER): Payer: Medicare Other | Admitting: Orthopaedic Surgery

## 2017-12-24 ENCOUNTER — Ambulatory Visit (INDEPENDENT_AMBULATORY_CARE_PROVIDER_SITE_OTHER): Payer: Medicare Other

## 2017-12-24 VITALS — BP 105/60 | HR 81 | Ht 71.0 in | Wt 234.0 lb

## 2017-12-24 DIAGNOSIS — Z96651 Presence of right artificial knee joint: Secondary | ICD-10-CM

## 2017-12-24 NOTE — Progress Notes (Signed)
Post-Op Visit Note   Patient: Johnny Huber           Date of Birth: [birth year]           MRN: [id number] Visit Date: 12/24/2017 PCP: Bridget Hartshorn, NP   Assessment & Plan: Post right total knee arthroplasty incision looks good he is off his pain medicine.  He has a few more home visits and then will transition to outpatient physical therapy.  X-rays show satisfactory position alignment and I will recheck him in 5 weeks.  He is amatory with a cane and is making good progress.  Chief Complaint:  Chief Complaint  Patient presents with  . Right Knee - Routine Post Op    11/29/17  Right TKA   Visit Diagnoses:  1. Status post total right knee replacement     Plan: He will start outpatient therapy in a week return in 5 weeks for check of his progress.  He is happy with the pain relief.  He is taking just plain Tylenol.  Follow-Up Instructions: No follow-ups on file.   Orders:  Orders Placed This Encounter  Procedures  . XR KNEE 3 VIEW RIGHT  . Ambulatory referral to Physical Therapy   No orders of the defined types were placed in this encounter.   Imaging: No results found.  PMFS History: Patient Active Problem List   Diagnosis Date Noted  . Arthritis of right knee 12/09/2017  . Unilateral primary osteoarthritis, right knee 03/05/2017  . Chronic right-sided low back pain with right-sided sciatica 01/01/2017  . Hypercholesteremia 03/11/2016  . Class 1 obesity due to excess calories with serious comorbidity and body mass index (BMI) of 34.0 to 34.9 in adult 03/11/2016  . ANEMIA 12/31/2009  . RECTAL BLEEDING 12/31/2009  . DM type 2 causing vascular disease ([place]) 04/12/2007  . DEGENERATIVE JOINT DISEASE, RIGHT KNEE 04/12/2007  . KNEE PAIN 04/12/2007  . Essential hypertension, benign 04/12/2007   Past Medical History:  Diagnosis Date  . Asthmatic bronchitis    as a child  . Coronary atherosclerosis of native coronary artery    BMS to RCA 2015  . DDD  (degenerative disc disease), lumbar   . Diabetes ([place])   . Gastroesophageal reflux disease   . Hypertension   . Insomnia, unspecified   . Obstructive sleep apnea    uses cpap  . Pneumonia   . Prostate cancer [facility])    had prostate removed  . Restless legs syndrome   . Rhinitis, allergic     Family History  Problem Relation Age of Onset  . Cancer Sister     Past Surgical History:  Procedure Laterality Date  . CARDIAC CATHETERIZATION  2015  . CORONARY ANGIOPLASTY    . OTHER SURGICAL HISTORY  1983   R KNEE ARTHORSCOPY x 3  . PROSTATECTOMY  2011  . TONSILLECTOMY    . TOTAL KNEE ARTHROPLASTY Right 12/09/2017   CEMENTED   . TOTAL KNEE ARTHROPLASTY Right 12/09/2017   Procedure: RIGHT TOTAL KNEE ARTHROPLASTY-CEMENTED;  Surgeon: [name], MD;  Location: [facility];  Service: Orthopedics;  Laterality: Right;   Social History   Occupational History  . Not on file  Tobacco Use  . Smoking status: Former Smoker    Last attempt to quit: 09/01/1982    Years since quitting: 35.3  . Smokeless tobacco: Never Used  Substance and Sexual Activity  . 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 Drug use: No  . Sexual activity: Yes    Partners: Female

## 2017-12-31 ENCOUNTER — Telehealth (INDEPENDENT_AMBULATORY_CARE_PROVIDER_SITE_OTHER): Payer: Self-pay | Admitting: Orthopaedic Surgery

## 2017-12-31 NOTE — Telephone Encounter (Signed)
Advise the person from advanced home care absolutely not.  He is only 3 weeks postop.

## 2017-12-31 NOTE — Telephone Encounter (Signed)
Can you please advise?

## 2017-12-31 NOTE — Telephone Encounter (Signed)
DEBBIE/PT/[facility] called wanted to know if patient can drive. Just taking Tylenol and has 110 range. Patient will like to drive to outpatient appointment on Monday and PT sees no reason he can't.  Please call Jackelyn Poling to advise [phone]

## 2018-01-01 NOTE — Telephone Encounter (Signed)
I called Johnny Huber and advised. 

## 2018-01-05 ENCOUNTER — Other Ambulatory Visit: Payer: Self-pay

## 2018-01-05 ENCOUNTER — Encounter: Payer: Self-pay | Admitting: Cardiology

## 2018-01-05 ENCOUNTER — Ambulatory Visit (INDEPENDENT_AMBULATORY_CARE_PROVIDER_SITE_OTHER): Payer: Medicare Other | Admitting: Cardiology

## 2018-01-05 VITALS — BP 98/63 | HR 65 | Ht 71.0 in | Wt 234.0 lb

## 2018-01-05 DIAGNOSIS — E782 Mixed hyperlipidemia: Secondary | ICD-10-CM

## 2018-01-05 DIAGNOSIS — I2510 Atherosclerotic heart disease of native coronary artery without angina pectoris: Secondary | ICD-10-CM | POA: Diagnosis not present

## 2018-01-05 DIAGNOSIS — Z794 Long term (current) use of insulin: Secondary | ICD-10-CM

## 2018-01-05 DIAGNOSIS — E119 Type 2 diabetes mellitus without complications: Secondary | ICD-10-CM | POA: Diagnosis not present

## 2018-01-05 DIAGNOSIS — I10 Essential (primary) hypertension: Secondary | ICD-10-CM | POA: Diagnosis not present

## 2018-01-05 NOTE — Patient Instructions (Signed)

## 2018-01-05 NOTE — Progress Notes (Signed)
Clinical Summary Johnny Huber is a 72 y.o.male seen today for follow up of the following medical problems.  1. CAD - former patient of [facility] - pci to RCA Nov 2015 after abnormal stress test. Normal LV function at that time.    - no recent chest pain. No SOB/DOE - compliant with meds. Taking ASA 81mg  daily as opposed to 325mg  as listed.    2. DM2 - followed by pcp - from cardiac standpoint he is on ARB and statin.   3. HTN - recent weight loss, bp's have been trending down. No significant orthostatic symptoms, can have some occasonal vertigo which is cohronic.    4. Hyperlipidemia -04/2017 TC 125 TG 123 HDL 31 LDL 69 - 07/2017 TC 142 TG 182 HDL 33 LDL 73 -compliant with statin.   5.Recent knee replacement 11/2017 - just starting rehab   6. AAA screen  AAA screen 2017 no aneurysm Past Medical History:  Diagnosis Date  . Asthmatic bronchitis    as a child  . Coronary atherosclerosis of native coronary artery    BMS to RCA 2015  . DDD (degenerative disc disease), lumbar   . Diabetes ([place])   . Gastroesophageal reflux disease   . Hypertension   . Insomnia, unspecified   . Obstructive sleep apnea    uses cpap  . Pneumonia   . Prostate cancer [facility])    had prostate removed  . Restless legs syndrome   . Rhinitis, allergic      No Known Allergies   Current Outpatient Medications  Medication Sig Dispense Refill  . albuterol (PROVENTIL HFA;VENTOLIN HFA) 108 (90 Base) MCG/ACT inhaler Inhale into the lungs every 6 (six) hours as needed for wheezing or shortness of breath.    Marland Kitchen amLODipine (NORVASC) 10 MG tablet Take 10 mg by mouth daily.    Marland Kitchen aspirin EC 325 MG EC tablet Take 1 tablet (325 mg total) by mouth daily with breakfast. 30 tablet 0  . atorvastatin (LIPITOR) 80 MG tablet TAKE 1 TABLET DAILY (DOSE INCREASE) 90 tablet 1  . BD VEO INSULIN SYRINGE U/F 31G X 15/64" 0.3 ML MISC     . cetirizine (ZYRTEC) 10 MG tablet Take 10 mg by mouth  daily.    . Cholecalciferol (VITAMIN D3) 1000 units CAPS Take 1,000 Units by mouth daily.     . citalopram (CELEXA) 20 MG tablet Take 20 mg by mouth daily.    Marland Kitchen glucose blood (FREESTYLE LITE) test strip Use as instructed bid 100 each 1  . HYDROcodone-acetaminophen (NORCO) 10-325 MG tablet Take 1 tablet by mouth every 6 (six) hours as needed. 50 tablet 0  . Insulin Degludec (TRESIBA FLEXTOUCH) 200 UNIT/ML SOPN Inject 20 Units into the skin at bedtime. (Patient taking differently: Inject 20 Units into the skin every morning. ) 9 mL 0  . Insulin Pen Needle (B-D ULTRAFINE III SHORT PEN) 31G X 8 MM MISC 1 each by Does not apply route as directed. 100 each 3  . Insulin Syringe-Needle U-100 (BD VEO INSULIN SYRINGE U/F) 31G X 15/64" 0.3 ML MISC USE 1 THREE TIMES A DAY WITH MEALS    . losartan-hydrochlorothiazide (HYZAAR) 100-25 MG tablet Take 1 tablet by mouth daily.     . metFORMIN (GLUCOPHAGE) 500 MG tablet Take 500 mg by mouth 2 (two) times daily with a meal.    . mometasone (NASONEX) 50 MCG/ACT nasal spray Place 2 sprays into the nose daily.    . nitroGLYCERIN (NITROSTAT) 0.4  MG SL tablet Place 0.4 mg under the tongue every 5 (five) minutes as needed for chest pain.    Marland Kitchen rOPINIRole (REQUIP) 1 MG tablet Take 1 mg by mouth 2 (two) times daily.     . Semaglutide (OZEMPIC) 0.25 or 0.5 MG/DOSE SOPN Inject 0.25mg  once weekly for 4 weeks, then increase to 0.5mg  once weekly if tolerating    . sitaGLIPtin (JANUVIA) 100 MG tablet Take 100 mg by mouth daily.     No current facility-administered medications for this visit.      Past Surgical History:  Procedure Laterality Date  . CARDIAC CATHETERIZATION  2015  . CORONARY ANGIOPLASTY    . OTHER SURGICAL HISTORY  1983   R KNEE ARTHORSCOPY x 3  . PROSTATECTOMY  2011  . TONSILLECTOMY    . TOTAL KNEE ARTHROPLASTY Right 12/09/2017   CEMENTED   . TOTAL KNEE ARTHROPLASTY Right 12/09/2017   Procedure: RIGHT TOTAL KNEE ARTHROPLASTY-CEMENTED;  Surgeon: [name], MD;  Location: [facility];  Service: Orthopedics;  Laterality: Right;     No Known Allergies    Family History  Problem Relation Age of Onset  . Cancer Sister      Social History Johnny Huber reports that he quit smoking about 35 years ago. He has never used smokeless tobacco. Johnny Huber reports that he does not drink alcohol.   Review of Systems CONSTITUTIONAL: No weight loss, fever, chills, weakness or fatigue.  HEENT: Eyes: No visual loss, blurred vision, double vision or yellow sclerae.No hearing loss, sneezing, congestion, runny nose or sore throat.  SKIN: No rash or itching.  CARDIOVASCULAR: per hpi RESPIRATORY: No shortness of breath, cough or sputum.  GASTROINTESTINAL: No anorexia, nausea, vomiting or diarrhea. No abdominal pain or blood.  GENITOURINARY: No burning on urination, no polyuria NEUROLOGICAL: No headache, dizziness, syncope, paralysis, ataxia, numbness or tingling in the extremities. No change in bowel or bladder control.  MUSCULOSKELETAL: No muscle, back pain, joint pain or stiffness.  LYMPHATICS: No enlarged nodes. No history of splenectomy.  PSYCHIATRIC: No history of depression or anxiety.  ENDOCRINOLOGIC: No reports of sweating, cold or heat intolerance. No polyuria or polydipsia.  Marland Kitchen   Physical Examination Vitals:   01/05/18 0952  BP: 98/63  Pulse: 65  SpO2: 93%   Vitals:   01/05/18 0952  Weight: 234 lb (106.1 kg)  Height: 5\' 11"  (1.803 m)    Gen: resting comfortably, no acute distress HEENT: no scleral icterus, pupils equal round and reactive, no palptable cervical adenopathy,  CV: RRR, no m/r/g, no jvd Resp: Clear to auscultation bilaterally GI: abdomen is soft, non-tender, non-distended, normal bowel sounds, no hepatosplenomegaly MSK: extremities are warm, no edema.  Skin: warm, no rash Neuro:  no focal deficits Psych: appropriate affect   Diagnostic Studies  03/2016 AAA [place] No anerusym  03/2014 cath Impression:  1.Totally  occluded LAD with strong right to left collaterals 2.Severe disease in the circumflex but small caliber vessel 3.Severe disease in the future dominant right coronary artery that was responsible for collaterals to the LAD. 4.Successful direct stenting with a bare-metal stent of the RCA 5.Preserved left ventricular function    Assessment and Plan  1. CAD - no recent symptoms, continue current meds  2 .HTN -manual check 110/60, remains at goal. Trending down with weight loss, healthier diet. If continues would stop his HCTZ.   3. Hyperlipidemia -overall at goal, continue current statin. Counseled on diet modification to help TGs and HDL.   4. DM2 -from  cardiac risk standpoint continue ARB and statin.    Arnoldo Lenis, M.D.

## 2018-01-19 ENCOUNTER — Telehealth: Payer: Self-pay | Admitting: Cardiology

## 2018-01-19 NOTE — Telephone Encounter (Signed)
Pt c/o BP issue:  1. What are your last 5 BP readings?   8-27 105/60    2. Are you having any other symptoms (ex. Dizziness, headache, blurred vision, passed out)? NO states that his BP is low  3. What is your medication issue?

## 2018-01-19 NOTE — Telephone Encounter (Signed)
Pt c/o general weakness and no energy gradually getting worse since knee surgery - was concerned that may be from lower BP 105/60 HR 67 - per LOV pt has lost around 20lbs in the last month or so and BP has been trending down - wife thinks may need to cut BP medication - pt denies any other symptoms - routed to provider

## 2018-01-20 MED ORDER — LOSARTAN POTASSIUM 50 MG PO TABS: 50.0000 mg | ORAL_TABLET | Freq: Every day | ORAL | 1 refills | Status: DC

## 2018-01-20 MED ORDER — LOSARTAN POTASSIUM 50 MG PO TABS: 50.0000 mg | ORAL_TABLET | Freq: Every day | ORAL | 0 refills | Status: DC

## 2018-01-20 NOTE — Telephone Encounter (Signed)
Have him stop his hyzaar, start losartan by itself 50mg  daily. Update [place] in 1 week on his bp's  Zandra Abts MD

## 2018-01-20 NOTE — Telephone Encounter (Signed)
Pt wife voiced understanding - will update [place] with BP - pt sills tart losartan 50 mg tomorrow - has already taken Hyzaar today

## 2018-01-20 NOTE — Telephone Encounter (Signed)
Patient returned call

## 2018-01-21 ENCOUNTER — Inpatient Hospital Stay (INDEPENDENT_AMBULATORY_CARE_PROVIDER_SITE_OTHER): Payer: Medicare Other | Admitting: Orthopaedic Surgery

## 2018-02-04 ENCOUNTER — Encounter (INDEPENDENT_AMBULATORY_CARE_PROVIDER_SITE_OTHER): Payer: Self-pay | Admitting: Orthopaedic Surgery

## 2018-02-04 ENCOUNTER — Ambulatory Visit (INDEPENDENT_AMBULATORY_CARE_PROVIDER_SITE_OTHER): Payer: Medicare Other | Admitting: Orthopaedic Surgery

## 2018-02-04 ENCOUNTER — Ambulatory Visit (INDEPENDENT_AMBULATORY_CARE_PROVIDER_SITE_OTHER): Payer: Medicare Other

## 2018-02-04 VITALS — BP 130/69 | HR 64 | Ht 71.0 in | Wt 230.0 lb

## 2018-02-04 DIAGNOSIS — M79645 Pain in left finger(s): Secondary | ICD-10-CM

## 2018-02-04 DIAGNOSIS — Z96651 Presence of right artificial knee joint: Secondary | ICD-10-CM

## 2018-02-04 NOTE — Progress Notes (Signed)
Post-Op Visit Note   Patient: Johnny Huber           Date of Birth: 04-27-1946           MRN: [id number] Visit Date: 02/04/2018 PCP: Bridget Hartshorn, NP   Assessment & Plan: Patient is doing well post right total knee arthroplasty.  He has full extension flexion to 120 degrees.  He still uses the rail when he goes up steps and needs to work on additional quad strengthening we gave him several exercises to work on.  He has had pain in his left thumb since he smashed it with a hammer years ago.  Some stiffness pain at the metacarpal phalangeal joint without locking.  Normal sensation no numbness in his hand.  Exam of the thumb demonstrates some decreased range of motion at the metacarpal phalangeal joint with the 30% loss of flexion and extension compared to the opposite right.  Some decreased IP joint motion with stable collateral ligaments.  Mild positive thumb grind test negative Finkelstein's scaphoid is normal.  A1 pulley is nontender no triggering.  Chief Complaint:  Chief Complaint  Patient presents with  . Right Knee - Follow-up  . Left Thumb - Pain   Visit Diagnoses:  1. Pain of left thumb   2. S/P total knee arthroplasty, right     Plan: Results were reviewed with patient with some mild degenerative changes in his thumb he can use some Aleve intermittently as needed.  He will work on continued quad strengthening return if he has increased problems with his right knee he is happy with the surgical result from his total knee arthroplasty done on 12/09/2017.  He is amatory without limp and no effusion.  Follow-Up Instructions: No follow-ups on file.   Orders:  Orders Placed This Encounter  Procedures  . XR Finger Thumb Left   No orders of the defined types were placed in this encounter.   Imaging: Xr Finger Thumb Left  Result Date: 02/04/2018 The x-rays left thumb obtained and reviewed.  This shows degenerative spurring at the IP joint of the thumb.  Joint space  narrowing at the first carpometacarpal joint.  Negative for acute changes.  Scaphoid is normal.  Mild joint space narrowing MP joint. Impression: Left thumb negative for acute changes.  Metacarpal phalangeal, interphalangeal and carpal metacarpal mild degenerative changes.   PMFS History: Patient Active Problem List   Diagnosis Date Noted  . Chronic right-sided low back pain with right-sided sciatica 01/01/2017  . Hypercholesteremia 03/11/2016  . Class 1 obesity due to excess calories with serious comorbidity and body mass index (BMI) of 34.0 to 34.9 in adult 03/11/2016  . ANEMIA 12/31/2009  . RECTAL BLEEDING 12/31/2009  . DM type 2 causing vascular disease ([place]) 04/12/2007  . DEGENERATIVE JOINT DISEASE, RIGHT KNEE 04/12/2007  . KNEE PAIN 04/12/2007  . Essential hypertension, benign 04/12/2007   Past Medical History:  Diagnosis Date  . Asthmatic bronchitis    as a child  . Coronary atherosclerosis of native coronary artery    BMS to RCA 2015  . DDD (degenerative disc disease), lumbar   . Diabetes ([place])   . Gastroesophageal reflux disease   . Hypertension   . Insomnia, unspecified   . Obstructive sleep apnea    uses cpap  . Pneumonia   . Prostate cancer [facility])    had prostate removed  . Restless legs syndrome   . Rhinitis, allergic     Family History  Problem Relation  Age of Onset  . Cancer Sister     Past Surgical History:  Procedure Laterality Date  . CARDIAC CATHETERIZATION  2015  . CORONARY ANGIOPLASTY    . OTHER SURGICAL HISTORY  1983   R KNEE ARTHORSCOPY x 3  . PROSTATECTOMY  2011  . TONSILLECTOMY    . TOTAL KNEE ARTHROPLASTY Right 12/09/2017   CEMENTED   . TOTAL KNEE ARTHROPLASTY Right 12/09/2017   Procedure: RIGHT TOTAL KNEE ARTHROPLASTY-CEMENTED;  Surgeon: [name], MD;  Location: [place];  Service: Orthopedics;  Laterality: Right;   Social History   Occupational History  . Not on file  Tobacco Use  . Smoking status: Former Smoker    Last attempt to  quit: 09/01/1982    Years since quitting: 35.4  . Smokeless tobacco: Never Used  Substance and Sexual Activity  . 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 Drug use: No  . Sexual activity: Yes    Partners: Female

## 2018-06-07 ENCOUNTER — Other Ambulatory Visit: Payer: Self-pay | Admitting: Cardiology

## 2018-07-03 ENCOUNTER — Other Ambulatory Visit: Payer: Self-pay | Admitting: Cardiology

## 2018-07-21 ENCOUNTER — Encounter: Payer: Self-pay | Admitting: Cardiology

## 2018-07-21 ENCOUNTER — Ambulatory Visit (INDEPENDENT_AMBULATORY_CARE_PROVIDER_SITE_OTHER): Payer: Medicare Other | Admitting: Cardiology

## 2018-07-21 VITALS — BP 113/66 | HR 75 | Ht 71.0 in | Wt 222.4 lb

## 2018-07-21 DIAGNOSIS — E782 Mixed hyperlipidemia: Secondary | ICD-10-CM | POA: Diagnosis not present

## 2018-07-21 DIAGNOSIS — I10 Essential (primary) hypertension: Secondary | ICD-10-CM | POA: Diagnosis not present

## 2018-07-21 DIAGNOSIS — I2510 Atherosclerotic heart disease of native coronary artery without angina pectoris: Secondary | ICD-10-CM | POA: Diagnosis not present

## 2018-07-21 NOTE — Patient Instructions (Signed)

## 2018-07-21 NOTE — Progress Notes (Signed)
Clinical Summary Johnny Huber is a 73 y.o.male seen today for follow up of the following medical problems.  1. CAD - former patient of [place] [facility] - pci to RCA Nov 2015 after abnormal stress test. Normal LV function at that time.    - no chest pain. No SOB or DOE - compliant with meds.    2. HTN - due to low bp's we stopped hyzaar, started losartan 50mg  daily by itself. Had recent significant weight loss   3. Hyperlipidemia 03/2018 TC 113 TG 196 HDL 26 LDL 48  he is compliant with statin   4. AAA screen  AAA screen 2017 no aneurysm  5. OSA - compliant with cpap Past Medical History:  Diagnosis Date  . Asthmatic bronchitis    as a child  . Coronary atherosclerosis of native coronary artery    BMS to RCA 2015  . DDD (degenerative disc disease), lumbar   . Diabetes ([place])   . Gastroesophageal reflux disease   . Hypertension   . Insomnia, unspecified   . Obstructive sleep apnea    uses cpap  . Pneumonia   . Prostate cancer [facility])    had prostate removed  . Restless legs syndrome   . Rhinitis, allergic      No Known Allergies   Current Outpatient Medications  Medication Sig Dispense Refill  . albuterol (PROVENTIL HFA;VENTOLIN HFA) 108 (90 Base) MCG/ACT inhaler Inhale into the lungs every 6 (six) hours as needed for wheezing or shortness of breath.    Marland Kitchen amLODipine (NORVASC) 10 MG tablet Take 10 mg by mouth daily.    Marland Kitchen aspirin EC 81 MG tablet Take 81 mg by mouth daily.    Marland Kitchen atorvastatin (LIPITOR) 80 MG tablet TAKE 1 TABLET DAILY (DOSE INCREASE) 90 tablet 4  . BD VEO INSULIN SYRINGE U/F 31G X 15/64" 0.3 ML MISC     . BYDUREON BCISE 2 MG/0.85ML AUIJ INJECT 2MG  INTO THE SKIN ONCE WEEKLY  5  . cetirizine (ZYRTEC) 10 MG tablet Take 10 mg by mouth daily.    . Cholecalciferol (VITAMIN D3) 1000 units CAPS Take 1,000 Units by mouth daily.     . citalopram (CELEXA) 20 MG tablet Take 20 mg by mouth daily.    Marland Kitchen glucose blood (FREESTYLE LITE) test strip Use  as instructed bid 100 each 1  . HYDROcodone-acetaminophen (NORCO) 10-325 MG tablet Take 1 tablet by mouth every 6 (six) hours as needed. (Patient not taking: Reported on 02/04/2018) 50 tablet 0  . Insulin Degludec (TRESIBA FLEXTOUCH) 200 UNIT/ML SOPN Inject 20 Units into the skin at bedtime. (Patient taking differently: Inject 20 Units into the skin every morning. ) 9 mL 0  . Insulin Pen Needle (B-D ULTRAFINE III SHORT PEN) 31G X 8 MM MISC 1 each by Does not apply route as directed. 100 each 3  . Insulin Syringe-Needle U-100 (BD VEO INSULIN SYRINGE U/F) 31G X 15/64" 0.3 ML MISC USE 1 THREE TIMES A DAY WITH MEALS    . LANTUS SOLOSTAR 100 UNIT/ML Solostar Pen     . losartan (COZAAR) 50 MG tablet TAKE 1 TABLET DAILY (NEW MEDICATION 01/20/18) 90 tablet 2  . metFORMIN (GLUCOPHAGE) 500 MG tablet Take 500 mg by mouth 2 (two) times daily with a meal.    . mometasone (NASONEX) 50 MCG/ACT nasal spray Place 2 sprays into the nose daily.    . nitroGLYCERIN (NITROSTAT) 0.4 MG SL tablet Place 0.4 mg under the tongue every 5 (five) minutes  as needed for chest pain.    Marland Kitchen rOPINIRole (REQUIP) 1 MG tablet Take 1 mg by mouth 2 (two) times daily.     . Semaglutide (OZEMPIC) 0.25 or 0.5 MG/DOSE SOPN Inject 0.25mg  once weekly for 4 weeks, then increase to 0.5mg  once weekly if tolerating    . TRULICITY 1.5 QM/5.7QI SOPN INJECT 1 DOSE SUBCUTANEOUSLY ONCE A WEEK FOR 30 DAYS  1   No current facility-administered medications for this visit.      Past Surgical History:  Procedure Laterality Date  . CARDIAC CATHETERIZATION  2015  . CORONARY ANGIOPLASTY    . OTHER SURGICAL HISTORY  1983   R KNEE ARTHORSCOPY x 3  . PROSTATECTOMY  2011  . TONSILLECTOMY    . TOTAL KNEE ARTHROPLASTY Right 12/09/2017   CEMENTED   . TOTAL KNEE ARTHROPLASTY Right 12/09/2017   Procedure: RIGHT TOTAL KNEE ARTHROPLASTY-CEMENTED;  Surgeon: [name], MD;  Location: [place];  Service: Orthopedics;  Laterality: Right;     No Known  Allergies    Family History  Problem Relation Age of Onset  . Cancer Sister      Social History Mr. Johnny Huber reports that he quit smoking about 35 years ago. He has never used smokeless tobacco. Mr. Johnny Huber reports no history of alcohol use.   Review of Systems CONSTITUTIONAL: No weight loss, fever, chills, weakness or fatigue.  HEENT: Eyes: No visual loss, blurred vision, double vision or yellow sclerae.No hearing loss, sneezing, congestion, runny nose or sore throat.  SKIN: No rash or itching.  CARDIOVASCULAR: per hpi RESPIRATORY: No shortness of breath, cough or sputum.  GASTROINTESTINAL: No anorexia, nausea, vomiting or diarrhea. No abdominal pain or blood.  GENITOURINARY: No burning on urination, no polyuria NEUROLOGICAL: No headache, dizziness, syncope, paralysis, ataxia, numbness or tingling in the extremities. No change in bowel or bladder control.  MUSCULOSKELETAL: No muscle, back pain, joint pain or stiffness.  LYMPHATICS: No enlarged nodes. No history of splenectomy.  PSYCHIATRIC: No history of depression or anxiety.  ENDOCRINOLOGIC: No reports of sweating, cold or heat intolerance. No polyuria or polydipsia.  Marland Kitchen   Physical Examination Vitals:   07/21/18 1056  BP: 113/66  Pulse: 75  SpO2: 94%   Vitals:   07/21/18 1056  Weight: 222 lb 6.4 oz (100.9 kg)  Height: 5\' 11"  (1.803 m)    Gen: resting comfortably, no acute distress HEENT: no scleral icterus, pupils equal round and reactive, no palptable cervical adenopathy,  CV: RRR, no m/r/g, no jvd Resp: Clear to auscultation bilaterally GI: abdomen is soft, non-tender, non-distended, normal bowel sounds, no hepatosplenomegaly MSK: extremities are warm, no edema.  Skin: warm, no rash Neuro:  no focal deficits Psych: appropriate affect   Diagnostic Studies 03/2016 AAA [place] No anerusym  03/2014 cath Impression:  1.Totally occluded LAD with strong right to left collaterals 2.Severe disease in the circumflex  but small caliber vessel 3.Severe disease in the future dominant right coronary artery that was responsible for collaterals to the LAD. 4.Successful direct stenting with a bare-metal stent of the RCA 5.Preserved left ventricular function    Assessment and Plan  1. CAD - no symptoms, continue current meds - EKG today shows SR, no acute ischemic chnges  2 .HTN - bp's at goal, have been able to scale back his meds due to recent weight loss - continue current meds  3. Hyperlipidemia -continue statin  F/u 6 months    Arnoldo Lenis, M.D.

## 2019-01-20 ENCOUNTER — Telehealth: Payer: Self-pay | Admitting: *Deleted

## 2019-01-20 NOTE — Telephone Encounter (Signed)
Patient's wife verbally consented for telehealth visits with [facility] and understands that his insurance company will be billed for the encounter.   Will try to have vitals available

## 2019-01-25 ENCOUNTER — Telehealth (INDEPENDENT_AMBULATORY_CARE_PROVIDER_SITE_OTHER): Payer: Medicare Other | Admitting: Cardiology

## 2019-01-25 ENCOUNTER — Encounter: Payer: Self-pay | Admitting: *Deleted

## 2019-01-25 ENCOUNTER — Encounter: Payer: Self-pay | Admitting: Cardiology

## 2019-01-25 VITALS — BP 120/70 | HR 86 | Ht 71.0 in | Wt 215.0 lb

## 2019-01-25 DIAGNOSIS — E782 Mixed hyperlipidemia: Secondary | ICD-10-CM | POA: Diagnosis not present

## 2019-01-25 DIAGNOSIS — I2510 Atherosclerotic heart disease of native coronary artery without angina pectoris: Secondary | ICD-10-CM

## 2019-01-25 DIAGNOSIS — I10 Essential (primary) hypertension: Secondary | ICD-10-CM | POA: Diagnosis not present

## 2019-01-25 NOTE — Patient Instructions (Signed)

## 2019-01-25 NOTE — Progress Notes (Signed)
Virtual Visit via Telephone Note   This visit type was conducted due to national recommendations for restrictions regarding the COVID-19 Pandemic (e.g. social distancing) in an effort to limit this patient's exposure and mitigate transmission in our community.  Due to his co-morbid illnesses, this patient is at least at moderate risk for complications without adequate follow up.  This format is felt to be most appropriate for this patient at this time.  The patient did not have access to video technology/had technical difficulties with video requiring transitioning to audio format only (telephone).  All issues noted in this document were discussed and addressed.  No physical exam could be performed with this format.  Please refer to the patient's chart for his  consent to telehealth for [facility].   Date:  01/25/2019   ID:  Johnny Huber, DOB Jan 23, 1946, MRN [id number]  Patient Location: Home Provider Location: Office  PCP:  Bridget Hartshorn, NP  Cardiologist:  Carlyle Dolly, MD  Electrophysiologist:  None   Evaluation Performed:  Follow-Up Visit  Chief Complaint:  Follow up visit  History of Present Illness:    Johnny Huber is a 73 y.o. male seen today for follow up of the following medical problems.  1. CAD - former patient of [place] [facility] - pci to RCA Nov 2015 after abnormal stress test. Normal LV function at that time.    - denies any chest pain, no SOB/DOE - compliant with meds  2. HTN - compliant with meds, home bp's typically 120s/70s    3. Hyperlipidemia 03/2018 TC 113 TG 196 HDL 26 LDL 48  - he reports more recent labs with pcp. Compliant with statin    4. AAA screen - AAA screen 2017 no aneurysm  5. OSA - he remains compliant with cpap   The patient does not have symptoms concerning for COVID-19 infection (fever, chills, cough, or new shortness of breath).    Past Medical History:  Diagnosis Date   Asthmatic  bronchitis    as a child   Coronary atherosclerosis of native coronary artery    BMS to RCA 2015   DDD (degenerative disc disease), lumbar    Diabetes ([place])    Gastroesophageal reflux disease    Hypertension    Insomnia, unspecified    Obstructive sleep apnea    uses cpap   Pneumonia    Prostate cancer ([place])    had prostate removed   Restless legs syndrome    Rhinitis, allergic    Past Surgical History:  Procedure Laterality Date   CARDIAC CATHETERIZATION  2015   CORONARY ANGIOPLASTY     OTHER SURGICAL HISTORY  1983   R KNEE ARTHORSCOPY x 3   PROSTATECTOMY  2011   TONSILLECTOMY     TOTAL KNEE ARTHROPLASTY Right 12/09/2017   CEMENTED    TOTAL KNEE ARTHROPLASTY Right 12/09/2017   Procedure: RIGHT TOTAL KNEE ARTHROPLASTY-CEMENTED;  Surgeon: [name], MD;  Location: [place];  Service: Orthopedics;  Laterality: Right;     No outpatient medications have been marked as taking for the 01/25/19 encounter (Appointment) with Arnoldo Lenis, MD.     Allergies:   Patient has no known allergies.   Social History   Tobacco Use   Smoking status: Former Smoker    Quit date: 09/01/1982    Years since quitting: 36.4   Smokeless tobacco: Never Used  Substance Use Topics   Alcohol use: No    Comment: heavy drinker in the past,  none for 15 years (as of 11/2017)   Drug use: No     Family Hx: The patient's family history includes Cancer in his sister.  ROS:   Please see the history of present illness.     All other systems reviewed and are negative.   Prior CV studies:   The following studies were reviewed today:  03/2016 AAA [place] No anerusym  03/2014 cath Impression:  1.Totally occluded LAD with strong right to left collaterals 2.Severe disease in the circumflex but small caliber vessel 3.Severe disease in the future dominant right coronary artery that was responsible for collaterals to the LAD. 4.Successful direct stenting with a bare-metal  stent of the RCA 5.Preserved left ventricular function  Labs/Other Tests and Data Reviewed:    EKG:  No ECG reviewed.  Recent Labs: No results found for requested labs within last 8760 hours.   Recent Lipid Panel Lab Results  Component Value Date/Time   CHOL 147 06/10/2016 07:42 AM   TRIG 132 06/10/2016 07:42 AM   HDL 30 (L) 06/10/2016 07:42 AM   CHOLHDL 4.9 06/10/2016 07:42 AM   LDLCALC 91 06/10/2016 07:42 AM    Wt Readings from Last 3 Encounters:  07/21/18 222 lb 6.4 oz (100.9 kg)  02/04/18 230 lb (104.3 kg)  01/05/18 234 lb (106.1 kg)     Objective:    Vital Signs:   Today's Vitals   01/25/19 0859  BP: 120/70  Pulse: 86  Weight: 215 lb (97.5 kg)  Height: 5\' 11"  (1.803 m)   Body mass index is 29.99 kg/m.  Normal affect. Normal speech pattern and tone. Comfortable, no apparent distress. No audible signs of SOb or wheezing.   ASSESSMENT & PLAN:    1. CAD - no recent symptoms, continue current meds  2 .HTN - at goal, continue current meds  3. Hyperlipidemia - lipids have been at goal. Request pcp labs, continue statin  F/u 6 months  COVID-19 Education: The signs and symptoms of COVID-19 were discussed with the patient and how to seek care for testing (follow up with PCP or arrange E-visit).  The importance of social distancing was discussed today.  Time:   Today, I have spent 13 minutes with the patient with telehealth technology discussing the above problems.     Medication Adjustments/Labs and Tests Ordered: Current medicines are reviewed at length with the patient today.  Concerns regarding medicines are outlined above.   Tests Ordered: No orders of the defined types were placed in this encounter.   Medication Changes: No orders of the defined types were placed in this encounter.   Follow Up:  In Person in 6 month(s)  Signed, Carlyle Dolly, MD  01/25/2019 8:16 AM    [place]

## 2019-04-01 ENCOUNTER — Other Ambulatory Visit: Payer: Self-pay | Admitting: Cardiology

## 2019-04-07 ENCOUNTER — Ambulatory Visit (INDEPENDENT_AMBULATORY_CARE_PROVIDER_SITE_OTHER): Payer: Medicare Other | Admitting: Orthopaedic Surgery

## 2019-04-07 ENCOUNTER — Ambulatory Visit (INDEPENDENT_AMBULATORY_CARE_PROVIDER_SITE_OTHER): Payer: Medicare Other

## 2019-04-07 ENCOUNTER — Encounter: Payer: Self-pay | Admitting: Orthopaedic Surgery

## 2019-04-07 VITALS — BP 123/74 | HR 79 | Ht 71.0 in | Wt 210.0 lb

## 2019-04-07 DIAGNOSIS — I2510 Atherosclerotic heart disease of native coronary artery without angina pectoris: Secondary | ICD-10-CM | POA: Diagnosis not present

## 2019-04-07 DIAGNOSIS — M542 Cervicalgia: Secondary | ICD-10-CM

## 2019-04-07 DIAGNOSIS — Z96651 Presence of right artificial knee joint: Secondary | ICD-10-CM | POA: Insufficient documentation

## 2019-04-07 DIAGNOSIS — M47812 Spondylosis without myelopathy or radiculopathy, cervical region: Secondary | ICD-10-CM | POA: Diagnosis not present

## 2019-04-07 NOTE — Progress Notes (Signed)
Office Visit Note   Patient: Johnny Huber           Date of Birth: [birth year]           MRN: [id number] Visit Date: 04/07/2019              Requested by: Bridget Hartshorn, NP [place],  [place] [id number] PCP: Bridget Hartshorn, NP   Assessment & Plan: Visit Diagnoses:  1. Neck pain   2. Spondylosis without myelopathy or radiculopathy, cervical region     Plan:   Follow-Up Instructions: No follow-ups on file.   Orders:  Orders Placed This Encounter  Procedures  . XR Cervical Spine 2 or 3 views   No orders of the defined types were placed in this encounter.     Procedures: No procedures performed   Clinical Data: No additional findings.   Subjective: Chief Complaint  Patient presents with  . Neck - Pain    HPI 73 year old male returns with ongoing problems with neck pain pain when he rotates to the right.  Pain has been present for 2 to 3 months does not radiate down his arm.  Has some arthritis at the left base of the thumb CMC joint.  Right total knee arthroplasty from 12/09/2017 does well occasionally he is very active during the day climbing kneeling and on his feet a lot he put some ice on his knee which seems to help.  Review of Systems 14 point update unchanged from last office visit.  Of note is diabetes type 2 hypertension total knee arthroplasty right.   Objective: Vital Signs: BP 123/74   Pulse 79   Ht 5\' 11"  (1.803 m)   Wt 210 lb (95.3 kg)   BMI 29.29 kg/m   Physical Exam Constitutional:      Appearance: He is well-developed.  HENT:     Head: Normocephalic and atraumatic.  Eyes:     Pupils: Pupils are equal, round, and reactive to light.  Neck:     Thyroid: No thyromegaly.     Trachea: No tracheal deviation.  Cardiovascular:     Rate and Rhythm: Normal rate.  Pulmonary:     Effort: Pulmonary effort is normal.     Breath sounds: No wheezing.  Abdominal:     General: Bowel sounds are normal.   Palpations: Abdomen is soft.  Skin:    General: Skin is warm and dry.     Capillary Refill: Capillary refill takes less than 2 seconds.  Neurological:     Mental Status: He is alert and oriented to person, place, and time.  Psychiatric:        Behavior: Behavior normal.        Thought Content: Thought content normal.        Judgment: Judgment normal.     Ortho Exam patient has good knee flexion on the right 225 degrees full extension good quad strength no laxity.  Brachial plexus tenderness more on the right than the left rotation of only 45 degrees on the right with pain.  Left is not restricted.  Upper extremity reflexes 2+.  Normal heel toe gait no lower extremity clonus.  Well-healed right total knee arthroplasty incision.  Specialty Comments:  No specialty comments available.  Imaging: No results found.   PMFS History: Patient Active Problem List   Diagnosis Date Noted  . Spondylosis without myelopathy or radiculopathy, cervical region 04/07/2019  . Chronic right-sided low back pain with  right-sided sciatica 01/01/2017  . Hypercholesteremia 03/11/2016  . Class 1 obesity due to excess calories with serious comorbidity and body mass index (BMI) of 34.0 to 34.9 in adult 03/11/2016  . ANEMIA 12/31/2009  . RECTAL BLEEDING 12/31/2009  . DM type 2 causing vascular disease ([place]) 04/12/2007  . DEGENERATIVE JOINT DISEASE, RIGHT KNEE 04/12/2007  . KNEE PAIN 04/12/2007  . Essential hypertension, benign 04/12/2007   Past Medical History:  Diagnosis Date  . Asthmatic bronchitis    as a child  . Coronary atherosclerosis of native coronary artery    BMS to RCA 2015  . DDD (degenerative disc disease), lumbar   . Diabetes ([place])   . Gastroesophageal reflux disease   . Hypertension   . Insomnia, unspecified   . Obstructive sleep apnea    uses cpap  . Pneumonia   . Prostate cancer [facility])    had prostate removed  . Restless legs syndrome   . Rhinitis, allergic     Family History   Problem Relation Age of Onset  . Cancer Sister     Past Surgical History:  Procedure Laterality Date  . CARDIAC CATHETERIZATION  2015  . CORONARY ANGIOPLASTY    . OTHER SURGICAL HISTORY  1983   R KNEE ARTHORSCOPY x 3  . PROSTATECTOMY  2011  . TONSILLECTOMY    . TOTAL KNEE ARTHROPLASTY Right 12/09/2017   CEMENTED   . TOTAL KNEE ARTHROPLASTY Right 12/09/2017   Procedure: RIGHT TOTAL KNEE ARTHROPLASTY-CEMENTED;  Surgeon: [name], MD;  Location: [place];  Service: Orthopedics;  Laterality: Right;   Social History   Occupational History  . Not on file  Tobacco Use  . Smoking status: Former Smoker    Quit date: 09/01/1982    Years since quitting: 36.6  . Smokeless tobacco: Never Used  Substance and Sexual Activity  . 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 Drug use: No  . Sexual activity: Yes    Partners: Female

## 2019-08-31 ENCOUNTER — Other Ambulatory Visit: Payer: Self-pay | Admitting: Cardiology

## 2019-09-09 ENCOUNTER — Telehealth (INDEPENDENT_AMBULATORY_CARE_PROVIDER_SITE_OTHER): Payer: Medicare Other | Admitting: Cardiology

## 2019-09-09 ENCOUNTER — Encounter: Payer: Self-pay | Admitting: Cardiology

## 2019-09-09 VITALS — BP 108/60 | HR 72 | Ht 71.0 in | Wt 209.0 lb

## 2019-09-09 DIAGNOSIS — I10 Essential (primary) hypertension: Secondary | ICD-10-CM

## 2019-09-09 DIAGNOSIS — Z87891 Personal history of nicotine dependence: Secondary | ICD-10-CM | POA: Diagnosis not present

## 2019-09-09 DIAGNOSIS — I2510 Atherosclerotic heart disease of native coronary artery without angina pectoris: Secondary | ICD-10-CM

## 2019-09-09 DIAGNOSIS — E782 Mixed hyperlipidemia: Secondary | ICD-10-CM

## 2019-09-09 DIAGNOSIS — Z7982 Long term (current) use of aspirin: Secondary | ICD-10-CM

## 2019-09-09 NOTE — Progress Notes (Signed)
Virtual Visit via Telephone Note   This visit type was conducted due to national recommendations for restrictions regarding the COVID-19 Pandemic (e.g. social distancing) in an effort to limit this patient's exposure and mitigate transmission in our community.  Due to his co-morbid illnesses, this patient is at least at moderate risk for complications without adequate follow up.  This format is felt to be most appropriate for this patient at this time.  The patient did not have access to video technology/had technical difficulties with video requiring transitioning to audio format only (telephone).  All issues noted in this document were discussed and addressed.  No physical exam could be performed with this format.  Please refer to the patient's chart for his  consent to telehealth for [facility].   The patient was identified using 2 identifiers.  Date:  09/09/2019   ID:  Johnny Huber, DOB 1945-10-24, MRN [id number]  Patient Location: Home Provider Location: Office  PCP:  Bridget Hartshorn, NP  Cardiologist:  Carlyle Dolly, MD  Electrophysiologist:  None   Evaluation Performed:  Follow-Up Visit  Chief Complaint:  Follow up  History of Present Illness:    Johnny Huber is a 74 y.o. male seen today for follow up of the following medical problems.  1. CAD - former patient of [place] [facility] - pci to RCA Nov 2015 after abnormal stress test. Normal LV function at that time.    - no recent chest pain. No SOB or DOE - compliant with meds  2. HTN - compliant with meds    3. Hyperlipidemia  09/2018 TC 113 TG 147 HDL 31 LDL 53 - compliatnt with meds   4. AAA screen - AAA screen 2017 no aneurysm  5. OSA - he remains compliant with cpap   6. Gastroparesis - followed by GI at [facility]  7. Elevated LFTs - followed by GI - midly elevated AST/ALT to 40s-60s,     Completed COVID vaccine.    The patient does not have symptoms concerning  for COVID-19 infection (fever, chills, cough, or new shortness of breath).    Past Medical History:  Diagnosis Date  . Asthmatic bronchitis    as a child  . Coronary atherosclerosis of native coronary artery    BMS to RCA 2015  . DDD (degenerative disc disease), lumbar   . Diabetes ([place])   . Gastroesophageal reflux disease   . Hypertension   . Insomnia, unspecified   . Obstructive sleep apnea    uses cpap  . Pneumonia   . Prostate cancer [facility])    had prostate removed  . Restless legs syndrome   . Rhinitis, allergic    Past Surgical History:  Procedure Laterality Date  . CARDIAC CATHETERIZATION  2015  . CORONARY ANGIOPLASTY    . OTHER SURGICAL HISTORY  1983   R KNEE ARTHORSCOPY x 3  . PROSTATECTOMY  2011  . TONSILLECTOMY    . TOTAL KNEE ARTHROPLASTY Right 12/09/2017   CEMENTED   . TOTAL KNEE ARTHROPLASTY Right 12/09/2017   Procedure: RIGHT TOTAL KNEE ARTHROPLASTY-CEMENTED;  Surgeon: [name], MD;  Location: [place];  Service: Orthopedics;  Laterality: Right;     No outpatient medications have been marked as taking for the 09/09/19 encounter (Appointment) with Arnoldo Lenis, MD.     Allergies:   Patient has no known allergies.   Social History   Tobacco Use  . Smoking status: Former Smoker    Quit date: 09/01/1982  Years since quitting: 37.0  . Smokeless tobacco: Never Used  Substance Use Topics  . 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 Drug use: No     Family Hx: The patient's family history includes Cancer in his sister.  ROS:   Please see the history of present illness.     All other systems reviewed and are negative.   Prior CV studies:   The following studies were reviewed today:  03/2016 AAA [place] No anerusym  03/2014 cath Impression:  1.Totally occluded LAD with strong right to left collaterals 2.Severe disease in the circumflex but small caliber vessel 3.Severe disease in the  future dominant right coronary artery that was responsible for collaterals to the LAD. 4.Successful direct stenting with a bare-metal stent of the RCA 5.Preserved left ventricular function  Labs/Other Tests and Data Reviewed:    EKG:  No ECG reviewed.  Recent Labs: No results found for requested labs within last 8760 hours.   Recent Lipid Panel Lab Results  Component Value Date/Time   CHOL 147 06/10/2016 07:42 AM   TRIG 132 06/10/2016 07:42 AM   HDL 30 (L) 06/10/2016 07:42 AM   CHOLHDL 4.9 06/10/2016 07:42 AM   LDLCALC 91 06/10/2016 07:42 AM    Wt Readings from Last 3 Encounters:  04/07/19 210 lb (95.3 kg)  01/25/19 215 lb (97.5 kg)  07/21/18 222 lb 6.4 oz (100.9 kg)     Objective:    Vital Signs:   Today's Vitals   09/09/19 0842  BP: 108/60  Pulse: 72  Weight: 209 lb (94.8 kg)  Height: 5\' 11"  (1.803 m)   Body mass index is 29.15 kg/m. Normal affect. Normal speech pattern and tone. No audible signs of SOB or wheezing.   ASSESSMENT & PLAN:    1. CAD No symptoms, continue current meds  2 .HTN -he is at goal, continue current meds  3. Hyperlipidemia - at goal, continue current meds    COVID-19 Education: The signs and symptoms of COVID-19 were discussed with the patient and how to seek care for testing (follow up with PCP or arrange E-visit).  The importance of social distancing was discussed today.  Time:   Today, I have spent 22 minutes with the patient with telehealth technology discussing the above problems.     Medication Adjustments/Labs and Tests Ordered: Current medicines are reviewed at length with the patient today.  Concerns regarding medicines are outlined above.   Tests Ordered: No orders of the defined types were placed in this encounter.   Medication Changes: No orders of the defined types were placed in this encounter.   Follow Up:  Either In Person or Virtual in 6 month(s)  Signed, Carlyle Dolly, MD  09/09/2019 8:17  AM    [place]

## 2019-09-09 NOTE — Patient Instructions (Signed)

## 2019-10-20 ENCOUNTER — Telehealth: Payer: Self-pay

## 2019-10-20 NOTE — Telephone Encounter (Signed)
Noted  

## 2019-10-20 NOTE — Telephone Encounter (Signed)
Okay.  We can obviously take him off the recall list.  Thanks.

## 2019-10-20 NOTE — Telephone Encounter (Signed)
Pt is on our recall list for a 10 year tcs. Last tcs 2011 with Dr.Fields and had hyperplastic polyps. After reviewing chart and care everywhere, pt has been seeing Dr.Katopes at [facility] and had a tcs on 08/23/19.

## 2019-12-15 IMAGING — CR DG CHEST 2V
2 series · 2 of 2 positions shown · non-contrast
Comparison: March 23, 2014

CLINICAL DATA: Preoperative evaluation for total knee replacement

EXAM:
CHEST - 2 VIEW

[w chest pa]
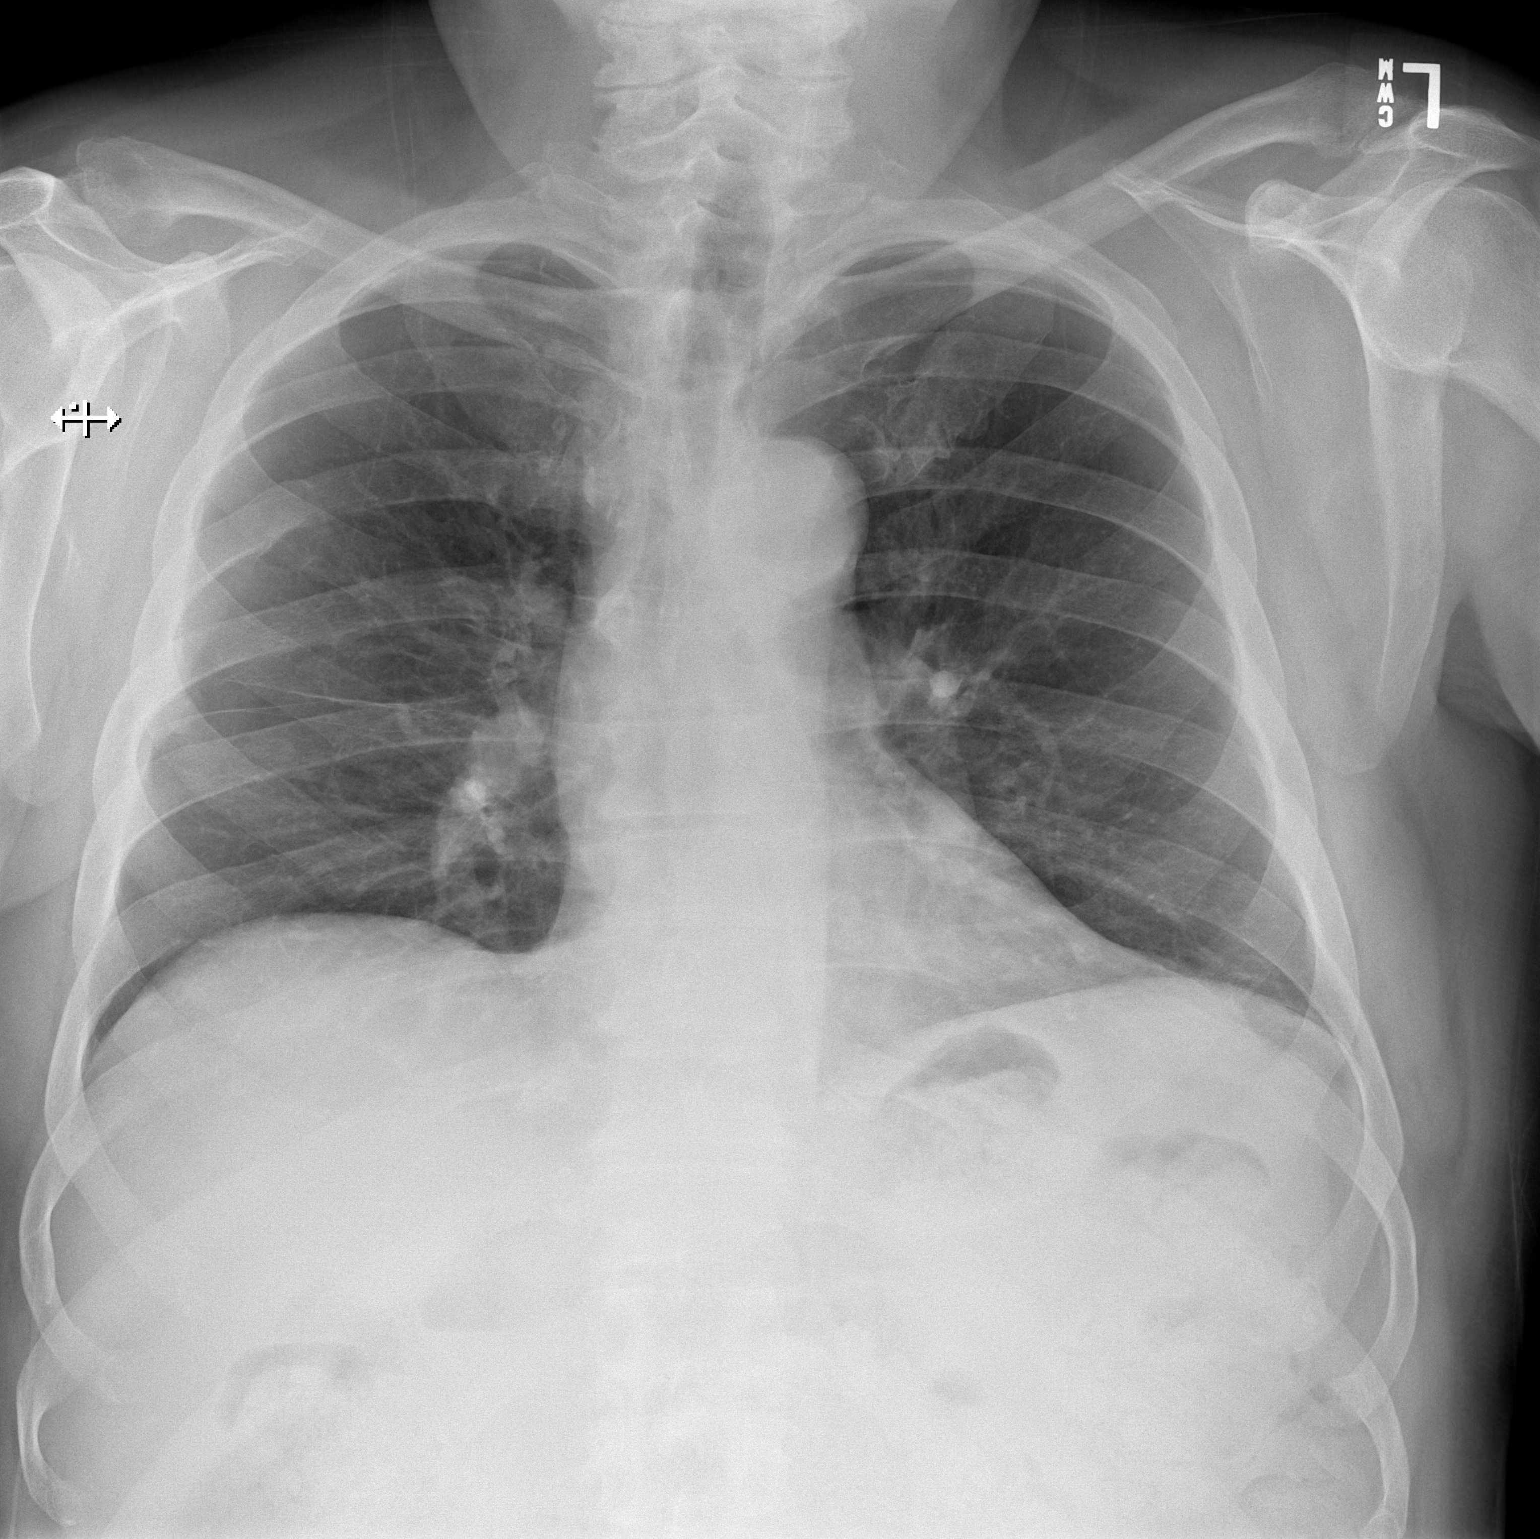

[w chest lat]
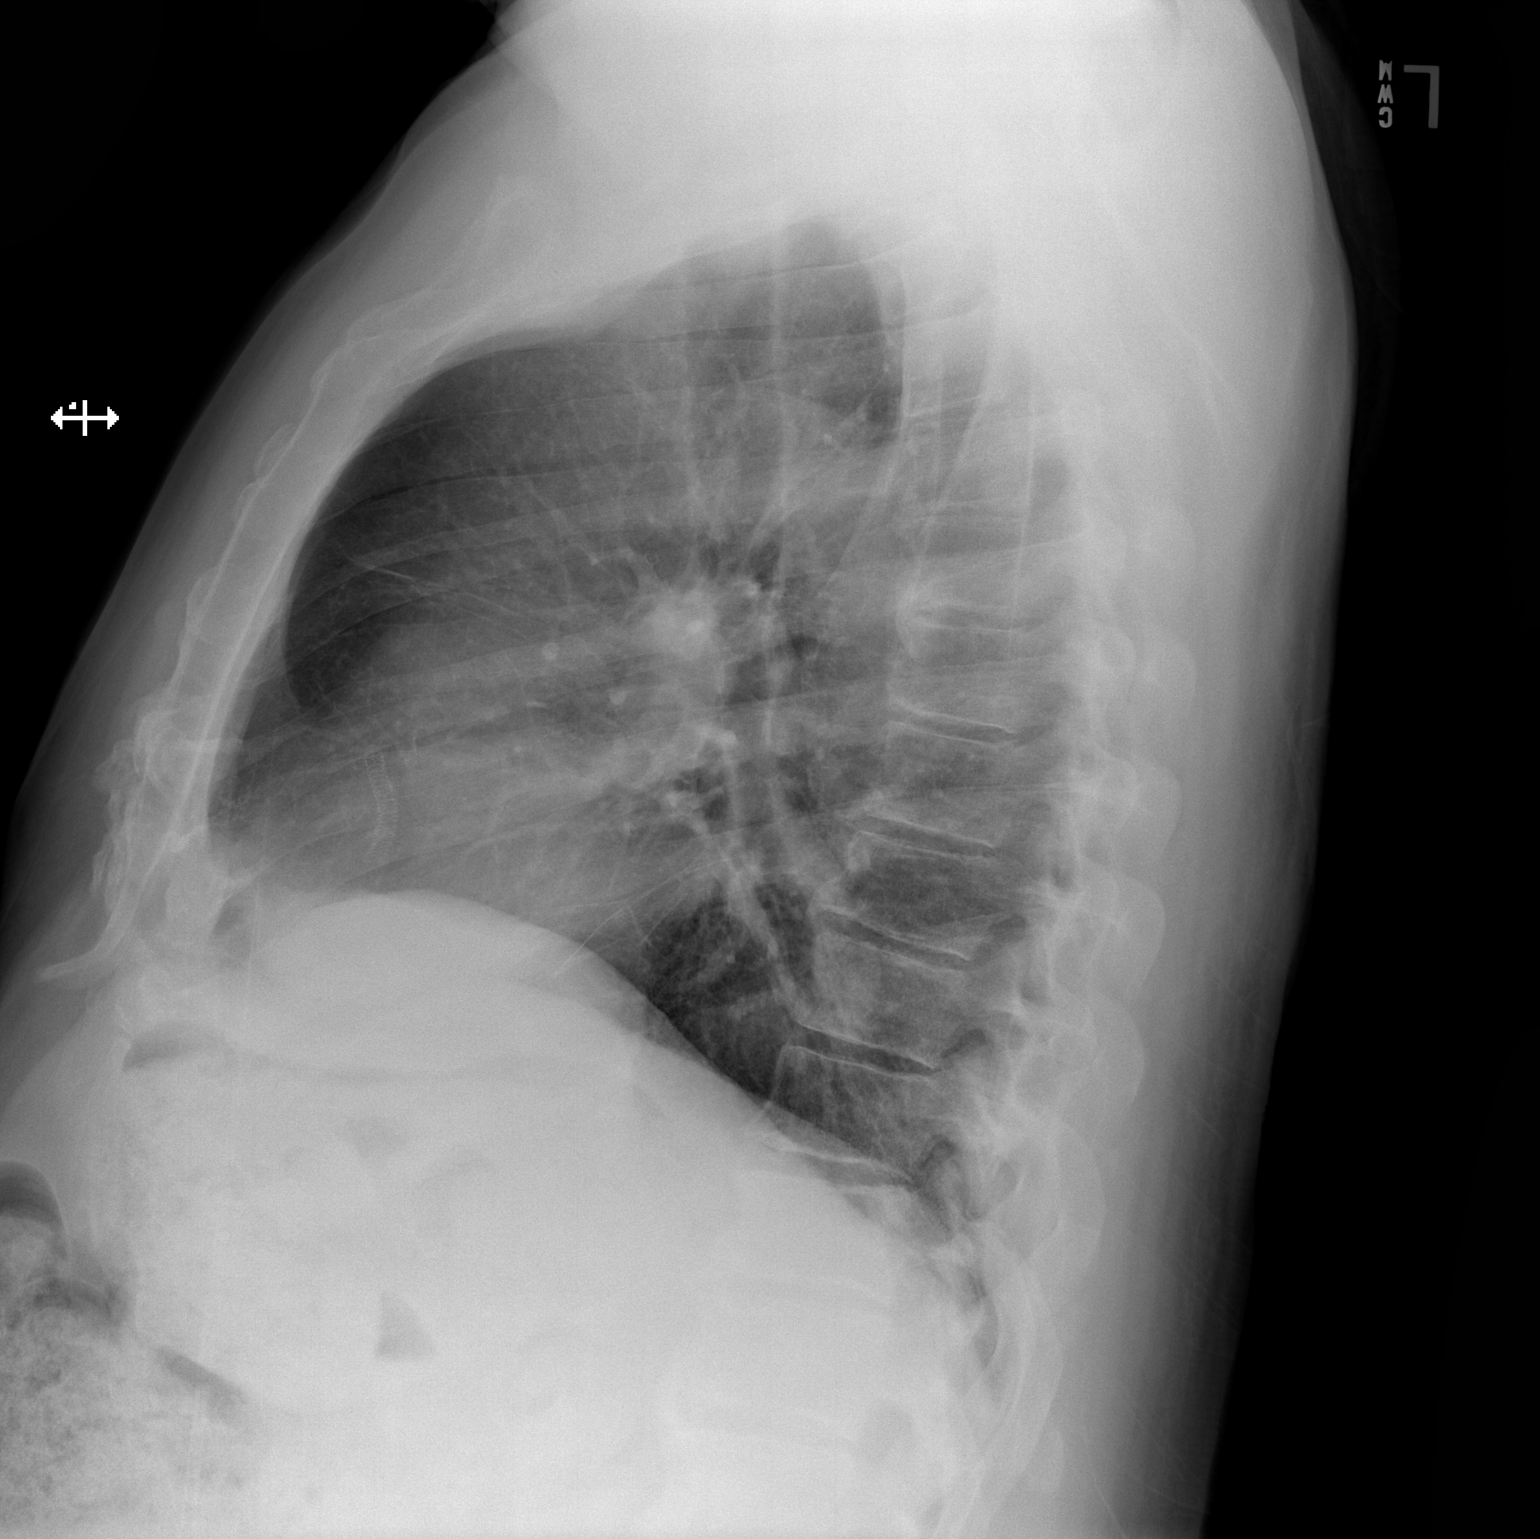

[2 of 2 positions shown; findings below may reference images not displayed]

FINDINGS: Lungs are clear. The heart size and pulmonary vascularity are
normal. No adenopathy. No evident lesions.
IMPRESSION: No edema or consolidation.

## 2020-03-23 ENCOUNTER — Ambulatory Visit: Payer: Medicare Other | Admitting: Cardiology

## 2020-04-05 NOTE — Progress Notes (Signed)
Cardiology Office Note  Date: 04/06/2020   ID: Johnny Huber, DOB 1945-10-03, MRN [id number]  PCP:  Bridget Hartshorn, NP  Cardiologist:  Carlyle Dolly, MD Electrophysiologist:  None   Chief Complaint: Follow-up CAD, HTN, HLD  History of Present Illness: Johnny Huber is a 74 y.o. male with a history of CAD, HLD, HTN, DM 2.  Last encounter with Dr. Harl Bowie 09/09/2019 via telemedicine visit.  Previous patient of [place] [facility].  PCI to RCA November 2015 after abnormal stress.  No recent chest pain, shortness of breath, or DOE.  Compliant with medications.  Blood pressure was well controlled and compliant with current antihypertensive medications.  LDL 53 in 2020.  Compliant with lipid medications.  Previous AAA screen -2017, compliant with CPAP for OSA.  History of gastroparesis and elevated LFTs followed by GI at [facility].  He is here for 45-month follow-up today.  In the interim since last visit he denies any acute illnesses or hospitalizations.  He brings with him recent lab work from PCP office.  All lab work appears to be within normal limits except for elevation and hemoglobin A1c and glucose.  Hemoglobin A1c was 8%.  He denies any anginal or exertional symptoms, orthostatic symptoms i.e. lightheadedness, dizziness, presyncope or syncopal episode.  No PND or orthopnea, CVA or TIA-like symptoms, palpitations or arrhythmias.  No bleeding issues.  No claudication-like symptoms, DVT or PE-like symptoms, or lower extremity edema.  He is compliant with all his medications.  Past Medical History:  Diagnosis Date  . Asthmatic bronchitis    as a child  . Coronary atherosclerosis of native coronary artery    BMS to RCA 2015  . DDD (degenerative disc disease), lumbar   . Diabetes ([place])   . Gastroesophageal reflux disease   . Hypertension   . Insomnia, unspecified   . Obstructive sleep apnea    uses cpap  . Pneumonia   . Prostate cancer [facility])    had prostate removed  .  Restless legs syndrome   . Rhinitis, allergic     Past Surgical History:  Procedure Laterality Date  . CARDIAC CATHETERIZATION  2015  . CORONARY ANGIOPLASTY    . OTHER SURGICAL HISTORY  1983   R KNEE ARTHORSCOPY x 3  . PROSTATECTOMY  2011  . TONSILLECTOMY    . TOTAL KNEE ARTHROPLASTY Right 12/09/2017   CEMENTED   . TOTAL KNEE ARTHROPLASTY Right 12/09/2017   Procedure: RIGHT TOTAL KNEE ARTHROPLASTY-CEMENTED;  Surgeon: [name], MD;  Location: [place];  Service: Orthopedics;  Laterality: Right;    Current Outpatient Medications  Medication Sig Dispense Refill  . albuterol (PROVENTIL HFA;VENTOLIN HFA) 108 (90 Base) MCG/ACT inhaler Inhale into the lungs every 6 (six) hours as needed for wheezing or shortness of breath.    Marland Kitchen amLODipine (NORVASC) 10 MG tablet Take 10 mg by mouth daily.    Marland Kitchen aspirin EC 81 MG tablet Take 81 mg by mouth daily.    Marland Kitchen atorvastatin (LIPITOR) 80 MG tablet TAKE 1 TABLET DAILY 90 tablet 3  . BD VEO INSULIN SYRINGE U/F 31G X 15/64" 0.3 ML MISC     . BYDUREON BCISE 2 MG/0.85ML AUIJ INJECT 2MG  INTO THE SKIN ONCE WEEKLY  5  . cetirizine (ZYRTEC) 10 MG tablet Take 10 mg by mouth daily.    . Cholecalciferol (VITAMIN D3) 1000 units CAPS Take 1,000 Units by mouth daily.     . citalopram (CELEXA) 20 MG tablet Take 20 mg by mouth  daily.    . Cyanocobalamin (B-12 COMPLIANCE INJECTION IJ) Inject as directed every 14 (fourteen) days.    Marland Kitchen glucose blood (FREESTYLE LITE) test strip Use as instructed bid 100 each 1  . HYDROcodone-acetaminophen (NORCO) 10-325 MG tablet Take 1 tablet by mouth every 6 (six) hours as needed. 50 tablet 0  . Insulin Pen Needle (B-D ULTRAFINE III SHORT PEN) 31G X 8 MM MISC 1 each by Does not apply route as directed. 100 each 3  . Insulin Syringe-Needle U-100 (BD VEO INSULIN SYRINGE U/F) 31G X 15/64" 0.3 ML MISC USE 1 THREE TIMES A DAY WITH MEALS    . LANTUS SOLOSTAR 100 UNIT/ML Solostar Pen Inject 24 Units into the skin every morning.     Marland Kitchen losartan  (COZAAR) 50 MG tablet TAKE 1 TABLET DAILY (NEW MEDICATION 01/20/18) 90 tablet 3  . mometasone (NASONEX) 50 MCG/ACT nasal spray Place 2 sprays into the nose daily.    . nitroGLYCERIN (NITROSTAT) 0.4 MG SL tablet Place 0.4 mg under the tongue every 5 (five) minutes as needed for chest pain.    Marland Kitchen OZEMPIC, 1 MG/DOSE, 2 MG/1.5ML SOPN INJECT 1MG  INTO THE SKIN ONCE A WEEK    . rOPINIRole (REQUIP) 1 MG tablet Take 1 mg by mouth 2 (two) times daily.      No current facility-administered medications for this visit.   Allergies:  Patient has no known allergies.   Social History: The patient  reports that he quit smoking about 37 years ago. He has never used smokeless tobacco. He reports that he does not drink alcohol and does not use drugs.   Family History: The patient's family history includes Cancer in his sister.   ROS:  Please see the history of present illness. Otherwise, complete review of systems is positive for none.  All other systems are reviewed and negative.   Physical Exam: VS:  BP 115/60   Pulse 61   Ht 5\' 11"  (1.803 m)   Wt 224 lb 6.4 oz (101.8 kg)   SpO2 92%   BMI 31.30 kg/m , BMI Body mass index is 31.3 kg/m.  Wt Readings from Last 3 Encounters:  04/06/20 224 lb 6.4 oz (101.8 kg)  09/09/19 209 lb (94.8 kg)  04/07/19 210 lb (95.3 kg)    General: Patient appears comfortable at rest. Neck: Supple, no elevated JVP or carotid bruits, no thyromegaly. Lungs: Clear to auscultation, nonlabored breathing at rest. Cardiac: Regular rate and rhythm, no S3 or significant systolic murmur, no pericardial rub. Extremities: No pitting edema, distal pulses 2+. Skin: Warm and dry. Musculoskeletal: No kyphosis. Neuropsychiatric: Alert and oriented x3, affect grossly appropriate.  ECG:  An ECG dated 04/06/2020 was personally reviewed today and demonstrated:  Normal sinus rhythm rate of 63, left axis deviation, septal infarct age undetermined.  Recent Labwork: No results found for requested  labs within last 8760 hours.     Component Value Date/Time   CHOL 147 06/10/2016 0742   TRIG 132 06/10/2016 0742   HDL 30 (L) 06/10/2016 0742   CHOLHDL 4.9 06/10/2016 0742   VLDL 26 06/10/2016 0742   LDLCALC 91 06/10/2016 0742    Other Studies Reviewed Today:  03/2016 AAA [place] No anerusym  03/2014 cath Impression:  1.Totally occluded LAD with strong right to left collaterals 2.Severe disease in the circumflex but small caliber vessel 3.Severe disease in the future dominant right coronary artery that was responsible for collaterals to the LAD. 4.Successful direct stenting with a bare-metal stent of the  RCA 5.Preserved left ventricular function  Assessment and Plan:  1. CAD in native artery   2. Essential hypertension, benign   3. Mixed hyperlipidemia    1. CAD in native artery Denies any progressive anginal symptoms or nitroglycerin use.  Continue aspirin 81 mg daily.  Continue nitroglycerin 0.4 mg sublingual.  2. Essential hypertension, benign Blood pressure well controlled on current therapy.  BP today 115/62.  Heart rate of 61.  Continue amlodipine 10 mg daily.  Continue losartan 50 mg daily.  3. Mixed hyperlipidemia Continue Lipitor 80 mg p.o. daily.Recent lipids panel 03/26/2020: TC 95, TG 220, HDL 31, LDL 21  Medication Adjustments/Labs and Tests Ordered: Current medicines are reviewed at length with the patient today.  Concerns regarding medicines are outlined above.   Disposition: Follow-up with Dr. Harl Bowie or APP 6 months  Signed, Levell July, NP 04/06/2020 9:08 AM    [facility] [place], [place], [place] [address] Phone: [phone]; Fax: [phone]

## 2020-04-06 ENCOUNTER — Encounter: Payer: Self-pay | Admitting: Family Medicine

## 2020-04-06 ENCOUNTER — Ambulatory Visit (INDEPENDENT_AMBULATORY_CARE_PROVIDER_SITE_OTHER): Payer: Medicare Other | Admitting: Family Medicine

## 2020-04-06 VITALS — BP 115/60 | HR 61 | Ht 71.0 in | Wt 224.4 lb

## 2020-04-06 DIAGNOSIS — I2510 Atherosclerotic heart disease of native coronary artery without angina pectoris: Secondary | ICD-10-CM

## 2020-04-06 DIAGNOSIS — E782 Mixed hyperlipidemia: Secondary | ICD-10-CM

## 2020-04-06 DIAGNOSIS — I10 Essential (primary) hypertension: Secondary | ICD-10-CM

## 2020-04-06 NOTE — Patient Instructions (Signed)
Medication Instructions:  Continue all current medications.   Labwork: none  Testing/Procedures: none  Follow-Up: 6 months   Any Other Special Instructions Will Be Listed Below (If Applicable).   If you need a refill on your cardiac medications before your next appointment, please call your pharmacy.  

## 2020-04-30 ENCOUNTER — Other Ambulatory Visit: Payer: Self-pay | Admitting: Cardiology

## 2020-06-20 ENCOUNTER — Telehealth: Payer: Self-pay | Admitting: Cardiology

## 2020-06-20 NOTE — Telephone Encounter (Signed)
   [facility] Pre-operative Risk Assessment    HEARTCARE STAFF: - Please ensure there is not already an duplicate clearance open for this procedure. - Under Visit Info/Reason for Call, type in Other and utilize the format Clearance MM/DD/YY or Clearance TBD. Do not use dashes or single digits. - If request is for dental extraction, please clarify the # of teeth to be extracted.  Request for surgical clearance:  1. What type of surgery is being performed?  IPP  2. When is this surgery scheduled? 08/15/20  3. What type of clearance is required (medical clearance vs. Pharmacy clearance to hold med vs. Both)? Pharmacy  4. Are there any medications that need to be held prior to surgery and how long? 10 days prior  for blood thinner , aspirin , fish oil, ibuprofen, napoxen, meloxicam, diclofenac, plavix    5 days prior for Coumadin but INR must below 1.4 on day of surgery  5 days for Eliquis , Pradaxa Xarelto   5. Practice name and name of physician performing surgery?  Dr Odis Luster   6. What is the office phone number? [phone]    7.   What is the office fax number? [phone]  8.   Anesthesia type (None, local, MAC, general) ? Not specified    Jannet Askew 06/20/2020, 12:07 PM  _________________________________________________________________   (provider comments below)

## 2020-06-20 NOTE — Telephone Encounter (Signed)
Johnny Huber 75 year old male was last seen in the clinic on 04/06/2020.  During that time he continued to do well.  He had no cardiac complaints.  He reported compliance with his medication.  His PMH includes cardiac catheterization 2015 with BMS to RCA, DDD, diabetes, GERD, hypertension, OSA on CPAP, RLS, and prostate cancer.  He is requesting to have a inflatable penile prosthesis implanted.  May his aspirin be held for the procedure?  Thank you for your help.  Please direct response to CV DIV preop pool.  Johnny Huber. Johnny Amadon NP-C    06/20/2020, 12:38 PM [address] Office [phone] Fax [phone]

## 2020-06-21 NOTE — Telephone Encounter (Signed)
Ok to hold aspirin for procedure and ok to proceed with procedure   J Nyjai Graff MD

## 2020-06-21 NOTE — Telephone Encounter (Signed)
   Primary Cardiologist: Carlyle Dolly, MD  Chart reviewed as part of pre-operative protocol coverage. Given past medical history and time since last visit, based on ACC/AHA guidelines, Johnny Huber would be at acceptable risk for the planned procedure without further cardiovascular testing.   His aspirin may be held for 5-7 days prior to his procedure.  Please resume as soon as hemostasis is achieved  I will route this recommendation to the requesting party via Epic fax function and remove from pre-op pool.  Please call with questions.  Jossie Ng. Mychele Seyller NP-C    06/21/2020, 4:00 PM [address] Office [phone] Fax [phone]

## 2020-06-28 ENCOUNTER — Ambulatory Visit (INDEPENDENT_AMBULATORY_CARE_PROVIDER_SITE_OTHER): Payer: Medicare Other

## 2020-06-28 ENCOUNTER — Other Ambulatory Visit: Payer: Self-pay

## 2020-06-28 ENCOUNTER — Ambulatory Visit (INDEPENDENT_AMBULATORY_CARE_PROVIDER_SITE_OTHER): Payer: Medicare Other | Admitting: Orthopaedic Surgery

## 2020-06-28 ENCOUNTER — Encounter: Payer: Self-pay | Admitting: Orthopaedic Surgery

## 2020-06-28 ENCOUNTER — Ambulatory Visit: Payer: Self-pay

## 2020-06-28 VITALS — Ht 71.0 in | Wt 223.0 lb

## 2020-06-28 DIAGNOSIS — M79642 Pain in left hand: Secondary | ICD-10-CM | POA: Diagnosis not present

## 2020-06-28 DIAGNOSIS — M79641 Pain in right hand: Secondary | ICD-10-CM | POA: Diagnosis not present

## 2020-06-28 NOTE — Progress Notes (Signed)
Office Visit Note   Patient: Johnny Huber           Date of Birth: 12-Mar-1946           MRN: [id number] Visit Date: 06/28/2020              Requested by: Bridget Hartshorn, NP [place],  [place] [id number] PCP: Bridget Hartshorn, NP   Assessment & Plan: Visit Diagnoses:  1. Bilateral hand pain     Plan: Replacement of left radial gutter splint he can use to help rest this time.  Continue Aspercreme application as needed.  Return if he has increased symptoms and requests first [facility] joint the left base of the thumb.  X-ray results were reviewed.  Follow-Up Instructions: No follow-ups on file.   Orders:  Orders Placed This Encounter  Procedures  . XR Hand Complete Left  . XR Hand Complete Right   No orders of the defined types were placed in this encounter.     Procedures: No procedures performed   Clinical Data: No additional findings.   Subjective: Chief Complaint  Patient presents with  . Right Hand - Pain  . 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  He has had increased pain in his hand particularly left thumb at the base with gripping and squeezing.  Also noted some increased pain right middle finger at the PIP joint without locking.  He has not really noticed much swelling of the PIP joint.  He is use over-the-counter creams ibuprofen usually 3 once a day.  He was referred to hand therapy and has been using some ice exercising and states it does not seem like it is really helped.  No history of rheumatoid arthritis.  He does have some cervical spondylosis but no radicular symptoms currently.  Review of Systems all other systems noncontributory to HPI.  Objective: Vital Signs: Ht 5\' 11"  (1.803 m)   Wt 223 lb (101.2 kg)   BMI 31.10 kg/m   Physical Exam Constitutional:      Appearance: He is well-developed and well-nourished.  HENT:     Head: Normocephalic and  atraumatic.  Eyes:     Extraocular Movements: EOM normal.     Pupils: Pupils are equal, round, and reactive to light.  Neck:     Thyroid: No thyromegaly.     Trachea: No tracheal deviation.  Cardiovascular:     Rate and Rhythm: Normal rate.  Pulmonary:     Effort: Pulmonary effort is normal.     Breath sounds: No wheezing.  Abdominal:     General: Bowel sounds are normal.     Palpations: Abdomen is soft.  Skin:    General: Skin is warm and dry.     Capillary Refill: Capillary refill takes less than 2 seconds.  Neurological:     Mental Status: He is alert and oriented to person, place, and time.  Psychiatric:        Mood and Affect: Mood and affect normal.        Behavior: Behavior normal.        Thought Content: Thought content normal.        Judgment: Judgment normal.     Ortho Exam patient has tenderness left first CMC joint.  Some prominence over the A1 pulley right long finger but no active triggering.  No loss of range of motion the  PIP can flex with fingertip to distal palmar crease.  Per fundi sub-Omair active.  Specialty Comments:  No specialty comments available.  Imaging: XR Hand Complete Left  Result Date: 06/28/2020 Three-view x-rays left hand demonstrates some degenerative changes first [facility] joint left greater than right hand.  Mild DIP spurring noted. Impression: First [facility] joint degenerative changes, mild.  Negative for acute changes.  XR Hand Complete Right  Result Date: 06/28/2020 Three-view x-rays right hand obtained and reviewed.  This shows some narrowing of PIP joint ring long and index finger.  Negative for acute changes. Mild DIP spurring is noted. Impression: Mild PIP joint space narrowing as described above.    PMFS History: Patient Active Problem List   Diagnosis Date Noted  . Spondylosis without myelopathy or radiculopathy, cervical region 04/07/2019  . Hx of total knee arthroplasty, right 04/07/2019  . Chronic right-sided low back pain with  right-sided sciatica 01/01/2017  . Hypercholesteremia 03/11/2016  . Class 1 obesity due to excess calories with serious comorbidity and body mass index (BMI) of 34.0 to 34.9 in adult 03/11/2016  . ANEMIA 12/31/2009  . RECTAL BLEEDING 12/31/2009  . DM type 2 causing vascular disease ([place]) 04/12/2007  . KNEE PAIN 04/12/2007  . Essential hypertension, benign 04/12/2007   Past Medical History:  Diagnosis Date  . Asthmatic bronchitis    as a child  . Coronary atherosclerosis of native coronary artery    BMS to RCA 2015  . DDD (degenerative disc disease), lumbar   . Diabetes ([place])   . Gastroesophageal reflux disease   . Hypertension   . Insomnia, unspecified   . Obstructive sleep apnea    uses cpap  . Pneumonia   . Prostate cancer [facility])    had prostate removed  . Restless legs syndrome   . Rhinitis, allergic     Family History  Problem Relation Age of Onset  . Cancer Sister     Past Surgical History:  Procedure Laterality Date  . CARDIAC CATHETERIZATION  2015  . CORONARY ANGIOPLASTY    . OTHER SURGICAL HISTORY  1983   R KNEE ARTHORSCOPY x 3  . PROSTATECTOMY  2011  . TONSILLECTOMY    . TOTAL KNEE ARTHROPLASTY Right 12/09/2017   CEMENTED   . TOTAL KNEE ARTHROPLASTY Right 12/09/2017   Procedure: RIGHT TOTAL KNEE ARTHROPLASTY-CEMENTED;  Surgeon: [name], MD;  Location: [place];  Service: Orthopedics;  Laterality: Right;   Social History   Occupational History  . Not on file  Tobacco Use  . Smoking status: Former Smoker    Quit date: 09/01/1982    Years since quitting: 37.8  . Smokeless tobacco: Never Used  Vaping Use  . Vaping Use: Never used  Substance and Sexual Activity  . 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 Drug use: No  . Sexual activity: Yes    Partners: Female

## 2020-09-12 ENCOUNTER — Other Ambulatory Visit: Payer: Self-pay | Admitting: Cardiology

## 2020-10-15 ENCOUNTER — Ambulatory Visit (INDEPENDENT_AMBULATORY_CARE_PROVIDER_SITE_OTHER): Payer: Medicare Other | Admitting: Cardiology

## 2020-10-15 ENCOUNTER — Encounter: Payer: Self-pay | Admitting: Cardiology

## 2020-10-15 ENCOUNTER — Other Ambulatory Visit: Payer: Self-pay | Admitting: Cardiology

## 2020-10-15 VITALS — BP 124/72 | HR 64 | Ht 71.0 in | Wt 221.0 lb

## 2020-10-15 DIAGNOSIS — E782 Mixed hyperlipidemia: Secondary | ICD-10-CM

## 2020-10-15 DIAGNOSIS — I10 Essential (primary) hypertension: Secondary | ICD-10-CM | POA: Diagnosis not present

## 2020-10-15 DIAGNOSIS — I2510 Atherosclerotic heart disease of native coronary artery without angina pectoris: Secondary | ICD-10-CM

## 2020-10-15 NOTE — Patient Instructions (Signed)
Medication Instructions:  Continue all current medications.   Labwork: none  Testing/Procedures: none  Follow-Up: 6 months   Any Other Special Instructions Will Be Listed Below (If Applicable).   If you need a refill on your cardiac medications before your next appointment, please call your pharmacy.  

## 2020-10-15 NOTE — Progress Notes (Signed)
Clinical Summary Johnny Huber is a 75 y.o.male  seen today for follow up of the following medical problems.  1. CAD - former patient of [place] [facility] - pci to RCA Nov 2015 after abnormal stress test. Normal LV function at that time.   - no chest pain. No SOB/DOE - compliant with meds  2. HTN - he is compliant with meds     3. Hyperlipidemia  09/2018 TC 113 TG 147 HDL 31 LDL 53 03/2020 TC 95, TG 220 HDL 30 LDL 21   4. AAA screen -AAA screen 2017 no aneurysm  5. OSA -he is compliant with cpap   6. Gastroparesis - followed by GI at [facility]  7. Elevated LFTs - followed by GI - midly elevated AST/ALT to 40s-60s,    Daughter is a Marine scientist, works as Land currently.    Past Medical History:  Diagnosis Date  . Asthmatic bronchitis    as a child  . Coronary atherosclerosis of native coronary artery    BMS to RCA 2015  . DDD (degenerative disc disease), lumbar   . Diabetes ([place])   . Gastroesophageal reflux disease   . Hypertension   . Insomnia, unspecified   . Obstructive sleep apnea    uses cpap  . Pneumonia   . Prostate cancer [facility])    had prostate removed  . Restless legs syndrome   . Rhinitis, allergic      No Known Allergies   Current Outpatient Medications  Medication Sig Dispense Refill  . albuterol (PROVENTIL HFA;VENTOLIN HFA) 108 (90 Base) MCG/ACT inhaler Inhale into the lungs every 6 (six) hours as needed for wheezing or shortness of breath.    Marland Kitchen amLODipine (NORVASC) 10 MG tablet Take 10 mg by mouth daily.    Marland Kitchen aspirin EC 81 MG tablet Take 81 mg by mouth daily.    Marland Kitchen atorvastatin (LIPITOR) 80 MG tablet TAKE 1 TABLET DAILY 90 tablet 3  . BD VEO INSULIN SYRINGE U/F 31G X 15/64" 0.3 ML MISC     . BYDUREON BCISE 2 MG/0.85ML AUIJ INJECT 2MG  INTO THE SKIN ONCE WEEKLY  5  . cetirizine (ZYRTEC) 10 MG tablet Take 10 mg by mouth daily.    . Cholecalciferol (VITAMIN D3) 1000 units CAPS Take 1,000 Units by mouth daily.      . citalopram (CELEXA) 20 MG tablet Take 20 mg by mouth daily.    . Cyanocobalamin (B-12 COMPLIANCE INJECTION IJ) Inject as directed every 14 (fourteen) days.    . DULoxetine (CYMBALTA) 30 MG capsule Take 30 mg by mouth 2 (two) times daily.    Marland Kitchen glucose blood (FREESTYLE LITE) test strip Use as instructed bid 100 each 1  . HYDROcodone-acetaminophen (NORCO) 10-325 MG tablet Take 1 tablet by mouth every 6 (six) hours as needed. (Patient not taking: Reported on 06/28/2020) 50 tablet 0  . ibuprofen (ADVIL) 400 MG tablet     . Insulin Pen Needle (B-D ULTRAFINE III SHORT PEN) 31G X 8 MM MISC 1 each by Does not apply route as directed. 100 each 3  . Insulin Syringe-Needle U-100 31G X 15/64" 0.3 ML MISC USE 1 THREE TIMES A DAY WITH MEALS    . LANTUS SOLOSTAR 100 UNIT/ML Solostar Pen Inject 24 Units into the skin every morning.     . loratadine (CLARITIN) 10 MG tablet Take 10 mg by mouth daily.    Marland Kitchen losartan (COZAAR) 50 MG tablet TAKE 1 TABLET DAILY (NEW MEDICATION 01/20/18) 90 tablet 3  .  mometasone (NASONEX) 50 MCG/ACT nasal spray Place 2 sprays into the nose daily.    . nitroGLYCERIN (NITROSTAT) 0.4 MG SL tablet Place 0.4 mg under the tongue every 5 (five) minutes as needed for chest pain.    Marland Kitchen OZEMPIC, 1 MG/DOSE, 2 MG/1.5ML SOPN INJECT 1MG  INTO THE SKIN ONCE A WEEK    . pantoprazole (PROTONIX) 40 MG tablet Take 40 mg by mouth daily.    Marland Kitchen rOPINIRole (REQUIP) 1 MG tablet Take 1 mg by mouth 2 (two) times daily.      No current facility-administered medications for this visit.     Past Surgical History:  Procedure Laterality Date  . CARDIAC CATHETERIZATION  2015  . CORONARY ANGIOPLASTY    . OTHER SURGICAL HISTORY  1983   R KNEE ARTHORSCOPY x 3  . PROSTATECTOMY  2011  . TONSILLECTOMY    . TOTAL KNEE ARTHROPLASTY Right 12/09/2017   CEMENTED   . TOTAL KNEE ARTHROPLASTY Right 12/09/2017   Procedure: RIGHT TOTAL KNEE ARTHROPLASTY-CEMENTED;  Surgeon: [name], MD;  Location: [facility];  Service:  Orthopedics;  Laterality: Right;     No Known Allergies    Family History  Problem Relation Age of Onset  . Cancer Sister      Social History Johnny Huber reports that he quit smoking about 38 years ago. He has never used smokeless tobacco. Johnny Huber reports no history of alcohol use.   Review of Systems CONSTITUTIONAL: No weight loss, fever, chills, weakness or fatigue.  HEENT: Eyes: No visual loss, blurred vision, double vision or yellow sclerae.No hearing loss, sneezing, congestion, runny nose or sore throat.  SKIN: No rash or itching.  CARDIOVASCULAR: per hpi RESPIRATORY: No shortness of breath, cough or sputum.  GASTROINTESTINAL: No anorexia, nausea, vomiting or diarrhea. No abdominal pain or blood.  GENITOURINARY: No burning on urination, no polyuria NEUROLOGICAL: No headache, dizziness, syncope, paralysis, ataxia, numbness or tingling in the extremities. No change in bowel or bladder control.  MUSCULOSKELETAL: No muscle, back pain, joint pain or stiffness.  LYMPHATICS: No enlarged nodes. No history of splenectomy.  PSYCHIATRIC: No history of depression or anxiety.  ENDOCRINOLOGIC: No reports of sweating, cold or heat intolerance. No polyuria or polydipsia.  Marland Kitchen   Physical Examination Today's Vitals   10/15/20 0837  BP: 124/72  Pulse: 64  SpO2: 92%  Weight: 221 lb (100.2 kg)  Height: 5\' 11"  (1.803 m)   Body mass index is 30.82 kg/m.  Gen: resting comfortably, no acute distress HEENT: no scleral icterus, pupils equal round and reactive, no palptable cervical adenopathy,  CV: RRR, no m/r/g, no jvd Resp: Clear to auscultation bilaterally GI: abdomen is soft, non-tender, non-distended, normal bowel sounds, no hepatosplenomegaly MSK: extremities are warm, no edema.  Skin: warm, no rash Neuro:  no focal deficits Psych: appropriate affect   Diagnostic Studies 03/2016 AAA [place] No anerusym  03/2014 cath Impression:  1.Totally occluded LAD with strong right to  left collaterals 2.Severe disease in the circumflex but small caliber vessel 3.Severe disease in the future dominant right coronary artery that was responsible for collaterals to the LAD. 4.Successful direct stenting with a bare-metal stent of the RCA 5.Preserved left ventricular function     Assessment and Plan  1. CAD - denies any symptoms, continue current meds  2 .HTN -at goal, continue current meds  3. Hyperlipidemia - LDL at goal, we disucssed dietary changes to improve TGs - continue statin  F/u 6 months   Arnoldo Lenis, M.D.

## 2020-11-28 ENCOUNTER — Other Ambulatory Visit: Payer: Self-pay | Admitting: Adult Health Nurse Practitioner

## 2020-11-28 ENCOUNTER — Other Ambulatory Visit (HOSPITAL_COMMUNITY): Payer: Self-pay | Admitting: Adult Health Nurse Practitioner

## 2021-04-22 ENCOUNTER — Other Ambulatory Visit: Payer: Self-pay | Admitting: Cardiology

## 2021-05-09 ENCOUNTER — Ambulatory Visit (INDEPENDENT_AMBULATORY_CARE_PROVIDER_SITE_OTHER): Payer: Medicare Other

## 2021-05-09 ENCOUNTER — Encounter: Payer: Self-pay | Admitting: Orthopaedic Surgery

## 2021-05-09 ENCOUNTER — Ambulatory Visit (INDEPENDENT_AMBULATORY_CARE_PROVIDER_SITE_OTHER): Payer: Medicare Other | Admitting: Orthopaedic Surgery

## 2021-05-09 ENCOUNTER — Other Ambulatory Visit: Payer: Self-pay

## 2021-05-09 VITALS — Ht 71.0 in | Wt 223.0 lb

## 2021-05-09 DIAGNOSIS — M25562 Pain in left knee: Secondary | ICD-10-CM

## 2021-05-09 DIAGNOSIS — I2510 Atherosclerotic heart disease of native coronary artery without angina pectoris: Secondary | ICD-10-CM

## 2021-05-09 NOTE — Progress Notes (Signed)
Office Visit Note   Patient: Johnny Huber           Date of Birth: 1946-04-16           MRN: [id number] Visit Date: 05/09/2021              Requested by: Bridget Hartshorn, NP [place],  [place] [address] PCP: Bridget Hartshorn, NP   Assessment & Plan: Visit Diagnoses:  1. Acute pain of left knee     Plan: Knee injection performed if he has persistent problems he will let us know we can consider diagnostic MRI scan since he may have torn meniscus.  Follow-Up Instructions: No follow-ups on file.   Orders:  Orders Placed This Encounter  Procedures   XR KNEE 3 VIEW LEFT   No orders of the defined types were placed in this encounter.     Procedures: No procedures performed   Clinical Data: No additional findings.   Subjective: Chief Complaint  Patient presents with   Left Knee - Pain    DOI 05/05/2021    HPI 75 year old male was deer hunting 4 days ago had to go down a steep hill to get to his deer stand and on the way down it was slippery and he injured his left knee with a sharp pain and had great difficulty walking.  Previous right total knee arthroplasty not giving any problems.  He has had persistent pain and had to use a cane in order to ambulate.  No true locking.  Pain bothers him at night difficult to go to sleep.  Prior to the injury when he was hunting he had not had much problems at all with his left knee.  He is been very happy with results of his right knee.  He does have some hypertension diabetes.  Review of Systems all the systems noncontributory.   Objective: Vital Signs: Ht 5\' 11"  (1.803 m)    Wt 223 lb (101.2 kg)    BMI 31.10 kg/m   Physical Exam Constitutional:      Appearance: He is well-developed.  HENT:     Head: Normocephalic and atraumatic.     Right Ear: External ear normal.     Left Ear: External ear normal.  Eyes:     Pupils: Pupils are equal, round, and reactive to light.  Neck:      Thyroid: No thyromegaly.     Trachea: No tracheal deviation.  Cardiovascular:     Rate and Rhythm: Normal rate.  Pulmonary:     Effort: Pulmonary effort is normal.     Breath sounds: No wheezing.  Abdominal:     General: Bowel sounds are normal.     Palpations: Abdomen is soft.  Musculoskeletal:     Cervical back: Neck supple.  Skin:    General: Skin is warm and dry.     Capillary Refill: Capillary refill takes less than 2 seconds.  Neurological:     Mental Status: He is alert and oriented to person, place, and time.  Psychiatric:        Behavior: Behavior normal.        Thought Content: Thought content normal.        Judgment: Judgment normal.    Ortho Exam patient has a 2+ knee effusion medial and lateral joint line tenderness crepitus with knee range of motion amatory with antalgic left knee limp.  Well-healed right midline incision no knee effusion collateral crucial ligament  exam exam of the left knee is intact.  Specialty Comments:  No specialty comments available.  Imaging: No results found.   PMFS History: Patient Active Problem List   Diagnosis Date Noted   Spondylosis without myelopathy or radiculopathy, cervical region 04/07/2019   Hx of total knee arthroplasty, right 04/07/2019   Chronic right-sided low back pain with right-sided sciatica 01/01/2017   Hypercholesteremia 03/11/2016   Class 1 obesity due to excess calories with serious comorbidity and body mass index (BMI) of 34.0 to 34.9 in adult 03/11/2016   ANEMIA 12/31/2009   RECTAL BLEEDING 12/31/2009   DM type 2 causing vascular disease ([place]) 04/12/2007   KNEE PAIN 04/12/2007   Essential hypertension, benign 04/12/2007   Past Medical History:  Diagnosis Date   Asthmatic bronchitis    as a child   Coronary atherosclerosis of native coronary artery    BMS to RCA 2015   DDD (degenerative disc disease), lumbar    Diabetes ([place])    Gastroesophageal reflux disease    Hypertension    Insomnia,  unspecified    Obstructive sleep apnea    uses cpap   Pneumonia    Prostate cancer ([place])    had prostate removed   Restless legs syndrome    Rhinitis, allergic     Family History  Problem Relation Age of Onset   Cancer Sister     Past Surgical History:  Procedure Laterality Date   CARDIAC CATHETERIZATION  2015   CORONARY ANGIOPLASTY     OTHER SURGICAL HISTORY  1983   R KNEE ARTHORSCOPY x 3   PROSTATECTOMY  2011   TONSILLECTOMY     TOTAL KNEE ARTHROPLASTY Right 12/09/2017   CEMENTED    TOTAL KNEE ARTHROPLASTY Right 12/09/2017   Procedure: RIGHT TOTAL KNEE ARTHROPLASTY-CEMENTED;  Surgeon: [name], MD;  Location: [facility];  Service: Orthopedics;  Laterality: Right;   Social History   Occupational History   Not on file  Tobacco Use   Smoking status: Former    Types: Cigarettes    Quit date: 09/01/1982    Years since quitting: 38.7   Smokeless tobacco: Never  Vaping Use   Vaping Use: Never used  Substance and Sexual Activity   Alcohol use: No    Comment: 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

## 2021-05-16 ENCOUNTER — Other Ambulatory Visit: Payer: Self-pay

## 2021-05-16 ENCOUNTER — Encounter: Payer: Self-pay | Admitting: Orthopaedic Surgery

## 2021-05-16 ENCOUNTER — Ambulatory Visit (INDEPENDENT_AMBULATORY_CARE_PROVIDER_SITE_OTHER): Payer: Medicare Other | Admitting: Orthopaedic Surgery

## 2021-05-16 VITALS — Ht 71.0 in | Wt 223.0 lb

## 2021-05-16 DIAGNOSIS — M25562 Pain in left knee: Secondary | ICD-10-CM

## 2021-05-16 DIAGNOSIS — I2510 Atherosclerotic heart disease of native coronary artery without angina pectoris: Secondary | ICD-10-CM | POA: Diagnosis not present

## 2021-05-16 NOTE — Progress Notes (Signed)
Office Visit Note   Patient: Johnny Huber           Date of Birth: 21-Nov-1945           MRN: [id number] Visit Date: 05/16/2021              Requested by: Bridget Hartshorn, NP [place],  [place] [address] PCP: Bridget Hartshorn, NP   Assessment & Plan: Visit Diagnoses:  1. Acute pain of left knee     Plan: Patient has had sharp knee pain since running accident beginning in December failed improvement with intra-articular injections and ibuprofen.  He is having to use a cane he is concerned that he may fall and states he like to proceed with an MRI scan and is concerned he may have torn his meniscus.  He has not had any true locking but has had some giving way and occasional sharp pain that causes him to stop walking.  Follow-up post MRI scan.  Follow-Up Instructions: Return after left knee MRI scan.  Orders:  Orders Placed This Encounter  Procedures   MR Knee Left w/o contrast   No orders of the defined types were placed in this encounter.     Procedures: No procedures performed   Clinical Data: No additional findings.   Subjective: Chief Complaint  Patient presents with   Left Knee - Pain    DOI 05/05/2021    HPI patient returns with ongoing problems with left knee pain.  He had an injection 05/09/2021 states it was good for a few days and then 48 hours after the injection he had recurrence of pain in his back ambulating with a cane is having sharp pain along the medial joint line.  Occasionally feels a sharp stabbing pain that freezes him up but is not actually fallen from it.  He has to be careful where he walks and has been avoiding stairs.  He has used ibuprofen with minimal relief.  The injection above only helped for 48 hours.  Review of Systems all other systems updated unchanged.  He does have type 2 diabetes and high cholesterol.  Also cervical spondylosis.   Objective: Vital Signs: Ht 5\' 11"  (1.803 m)    Wt 223 lb  (101.2 kg)    BMI 31.10 kg/m   Physical Exam Constitutional:      Appearance: He is well-developed.  HENT:     Head: Normocephalic and atraumatic.     Right Ear: External ear normal.     Left Ear: External ear normal.  Eyes:     Pupils: Pupils are equal, round, and reactive to light.  Neck:     Thyroid: No thyromegaly.     Trachea: No tracheal deviation.  Cardiovascular:     Rate and Rhythm: Normal rate.  Pulmonary:     Effort: Pulmonary effort is normal.     Breath sounds: No wheezing.  Abdominal:     General: Bowel sounds are normal.     Palpations: Abdomen is soft.  Musculoskeletal:     Cervical back: Neck supple.  Skin:    General: Skin is warm and dry.     Capillary Refill: Capillary refill takes less than 2 seconds.  Neurological:     Mental Status: He is alert and oriented to person, place, and time.  Psychiatric:        Behavior: Behavior normal.        Thought Content: Thought content normal.  Judgment: Judgment normal.    Ortho Exam patient has full extension of the left knee but has exquisite tenderness with hyperextension at the medial joint line tenderness palpation no lateral patellar subluxation.  Inferior pole of the patella and the medial aspect the patellar tendon is tender.  Pain with flexion extension.  No tenderness along the lateral aspect of the patellar tendon.  He flexes past 100 degrees.  Negative logroll the hips.  No sciatic notch tenderness.  Opposite right knee has no tenderness no swelling and full range of motion.  Specialty Comments:  No specialty comments available.  Imaging: No results found.   PMFS History: Patient Active Problem List   Diagnosis Date Noted   Spondylosis without myelopathy or radiculopathy, cervical region 04/07/2019   Hx of total knee arthroplasty, right 04/07/2019   Chronic right-sided low back pain with right-sided sciatica 01/01/2017   Hypercholesteremia 03/11/2016   Class 1 obesity due to excess  calories with serious comorbidity and body mass index (BMI) of 34.0 to 34.9 in adult 03/11/2016   ANEMIA 12/31/2009   RECTAL BLEEDING 12/31/2009   DM type 2 causing vascular disease ([place]) 04/12/2007   KNEE PAIN 04/12/2007   Essential hypertension, benign 04/12/2007   Past Medical History:  Diagnosis Date   Asthmatic bronchitis    as a child   Coronary atherosclerosis of native coronary artery    BMS to RCA 2015   DDD (degenerative disc disease), lumbar    Diabetes ([place])    Gastroesophageal reflux disease    Hypertension    Insomnia, unspecified    Obstructive sleep apnea    uses cpap   Pneumonia    Prostate cancer ([place])    had prostate removed   Restless legs syndrome    Rhinitis, allergic     Family History  Problem Relation Age of Onset   Cancer Sister     Past Surgical History:  Procedure Laterality Date   CARDIAC CATHETERIZATION  2015   CORONARY ANGIOPLASTY     OTHER SURGICAL HISTORY  1983   R KNEE ARTHORSCOPY x 3   PROSTATECTOMY  2011   TONSILLECTOMY     TOTAL KNEE ARTHROPLASTY Right 12/09/2017   CEMENTED    TOTAL KNEE ARTHROPLASTY Right 12/09/2017   Procedure: RIGHT TOTAL KNEE ARTHROPLASTY-CEMENTED;  Surgeon: [name], MD;  Location: [facility];  Service: Orthopedics;  Laterality: Right;   Social History   Occupational History   Not on file  Tobacco Use   Smoking status: Former    Types: Cigarettes    Quit date: 09/01/1982    Years since quitting: 38.7   Smokeless tobacco: Never  Vaping Use   Vaping Use: Never used  Substance and Sexual Activity   Alcohol use: No    Comment: 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

## 2021-05-30 ENCOUNTER — Ambulatory Visit (HOSPITAL_COMMUNITY)
Admission: RE | Admit: 2021-05-30 | Discharge: 2021-05-30 | Disposition: A | Discharge status: Home / Self Care | Payer: Medicare Other | Source: Ambulatory Visit | Attending: Orthopaedic Surgery | Admitting: Orthopaedic Surgery

## 2021-05-30 ENCOUNTER — Other Ambulatory Visit: Payer: Self-pay

## 2021-05-30 DIAGNOSIS — M25562 Pain in left knee: Secondary | ICD-10-CM | POA: Insufficient documentation

## 2021-06-06 ENCOUNTER — Telehealth: Payer: Self-pay

## 2021-06-06 NOTE — Telephone Encounter (Signed)
Please advise 

## 2021-06-06 NOTE — Telephone Encounter (Signed)
I called, made patient appt.

## 2021-06-06 NOTE — Telephone Encounter (Signed)
Pt called into the office and would like to know if Dr. Lorin Mercy can call with Patients MRI results.   Please advise

## 2021-06-13 ENCOUNTER — Other Ambulatory Visit: Payer: Self-pay

## 2021-06-13 ENCOUNTER — Ambulatory Visit (INDEPENDENT_AMBULATORY_CARE_PROVIDER_SITE_OTHER): Payer: Medicare Other | Admitting: Orthopaedic Surgery

## 2021-06-13 ENCOUNTER — Encounter: Payer: Self-pay | Admitting: Orthopaedic Surgery

## 2021-06-13 DIAGNOSIS — M1712 Unilateral primary osteoarthritis, left knee: Secondary | ICD-10-CM | POA: Diagnosis not present

## 2021-06-13 NOTE — Progress Notes (Signed)
Office Visit Note   Patient: Johnny Huber           Date of Birth: 1946-01-31           MRN: [id number] Visit Date: 06/13/2021              Requested by: Bridget Hartshorn, NP [place],  [place] [phone] PCP: Bridget Hartshorn, NP   Assessment & Plan: Visit Diagnoses:  1. Unilateral primary osteoarthritis, left knee     Plan: Left knee osteoarthritis progressive by MRI scan.  Patient does osteophyte formation and some joint space narrowing but more pronounced arthritic changes on his MRI scan.  He will need to get his A1c down below 7.7 and then he will call us and can proceed with scheduling of left total knee arthroplasty.  Risks of surgery discussed.  Plan to be spinal anesthesia.  We discussed home physical therapy followed by outpatient therapy.  He lives in [place].  Questions were elicited and answered.  Decision for surgery made once his A1c is below 7.7  Follow-Up Instructions: No follow-ups on file.   Orders:  No orders of the defined types were placed in this encounter.  No orders of the defined types were placed in this encounter.     Procedures: No procedures performed   Clinical Data: No additional findings.   Subjective: Chief Complaint  Patient presents with   Left Knee - Pain, Follow-up    MRI review    HPI 76 year old male returns with progressive left knee pain with osteoarthritis.  MRI scan showed joint effusion and ACL degeneration and mucoid, cartilage degeneration most significant medial and patellofemoral compartment consistent with progressive osteoarthritis.  He is amatory with a cane and states "I can barely walk and needs something done".  He does have diabetes.  Last A1c 3 months ago was greater than 10.  Patient has had an A1c below 7 when he had his previous opposite right knee arthroplasty July 2019.  Patient states is 1 weakness is ice cream and he likes to eat it.  He states he can  fix his A1c and get it below 7.7 but will take some time.  Review of Systems positive type 2 diabetes on insulin.  He also takes Ozempic.  Cervical spondylosis without surgery now not symptomatic.  All other systems noncontributory HPI.   Objective: Vital Signs: Ht 5\' 11"  (1.803 m)    Wt 223 lb (101.2 kg)    BMI 31.10 kg/m   Physical Exam Constitutional:      Appearance: He is well-developed.  HENT:     Head: Normocephalic and atraumatic.     Right Ear: External ear normal.     Left Ear: External ear normal.  Eyes:     Pupils: Pupils are equal, round, and reactive to light.  Neck:     Thyroid: No thyromegaly.     Trachea: No tracheal deviation.  Cardiovascular:     Rate and Rhythm: Normal rate.  Pulmonary:     Effort: Pulmonary effort is normal.     Breath sounds: No wheezing.  Abdominal:     General: Bowel sounds are normal.     Palpations: Abdomen is soft.  Musculoskeletal:     Cervical back: Neck supple.  Skin:    General: Skin is warm and dry.     Capillary Refill: Capillary refill takes less than 2 seconds.  Neurological:     Mental Status:  He is alert and oriented to person, place, and time.  Psychiatric:        Behavior: Behavior normal.        Thought Content: Thought content normal.        Judgment: Judgment normal.    Ortho Exam well-healed right total knee arthroplasty midline incision good collateral ligament balance full extension flexion to 120 degrees.  Opposite left knee has medial lateral joint line tenderness crepitus with knee range of motion he is amatory with a cane pronounced left knee limp negative logroll hips distal pulses are palpable.  No pitting edema.  No plantar foot lesions.  Specialty Comments:  No specialty comments available.  Imaging: No results found.   PMFS History: Patient Active Problem List   Diagnosis Date Noted   Unilateral primary osteoarthritis, left knee 06/13/2021   Spondylosis without myelopathy or radiculopathy,  cervical region 04/07/2019   Hx of total knee arthroplasty, right 04/07/2019   Chronic right-sided low back pain with right-sided sciatica 01/01/2017   Hypercholesteremia 03/11/2016   Class 1 obesity due to excess calories with serious comorbidity and body mass index (BMI) of 34.0 to 34.9 in adult 03/11/2016   ANEMIA 12/31/2009   RECTAL BLEEDING 12/31/2009   DM type 2 causing vascular disease ([place]) 04/12/2007   KNEE PAIN 04/12/2007   Essential hypertension, benign 04/12/2007   Past Medical History:  Diagnosis Date   Asthmatic bronchitis    as a child   Coronary atherosclerosis of native coronary artery    BMS to RCA 2015   DDD (degenerative disc disease), lumbar    Diabetes ([place])    Gastroesophageal reflux disease    Hypertension    Insomnia, unspecified    Obstructive sleep apnea    uses cpap   Pneumonia    Prostate cancer ([place])    had prostate removed   Restless legs syndrome    Rhinitis, allergic     Family History  Problem Relation Age of Onset   Cancer Sister     Past Surgical History:  Procedure Laterality Date   CARDIAC CATHETERIZATION  2015   CORONARY ANGIOPLASTY     OTHER SURGICAL HISTORY  1983   R KNEE ARTHORSCOPY x 3   PROSTATECTOMY  2011   TONSILLECTOMY     TOTAL KNEE ARTHROPLASTY Right 12/09/2017   CEMENTED    TOTAL KNEE ARTHROPLASTY Right 12/09/2017   Procedure: RIGHT TOTAL KNEE ARTHROPLASTY-CEMENTED;  Surgeon: [name], MD;  Location: [facility];  Service: Orthopedics;  Laterality: Right;   Social History   Occupational History   Not on file  Tobacco Use   Smoking status: Former    Types: Cigarettes    Quit date: 09/01/1982    Years since quitting: 38.8   Smokeless tobacco: Never  Vaping Use   Vaping Use: Never used  Substance and Sexual Activity   Alcohol use: No    Comment: 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

## 2021-07-04 ENCOUNTER — Telehealth: Payer: Self-pay | Admitting: Radiology

## 2021-07-04 ENCOUNTER — Other Ambulatory Visit: Payer: Self-pay | Admitting: Orthopaedic Surgery

## 2021-07-04 MED ORDER — DICLOFENAC SODIUM 75 MG PO TBEC: 75.0000 mg | DELAYED_RELEASE_TABLET | Freq: Two times a day (BID) | ORAL | 1 refills | Status: DC

## 2021-07-04 NOTE — Progress Notes (Signed)
elo

## 2021-07-04 NOTE — Telephone Encounter (Signed)
Patient called and states that knee is really bothering him. He would like to know if you could call something in for him?  He uses [facility] in [place].

## 2021-07-04 NOTE — Telephone Encounter (Signed)
noted 

## 2021-08-05 ENCOUNTER — Other Ambulatory Visit: Payer: Self-pay | Admitting: Orthopaedic Surgery

## 2021-08-22 ENCOUNTER — Telehealth: Payer: Self-pay | Admitting: Radiology

## 2021-08-22 NOTE — Telephone Encounter (Signed)
Patient called [facility] office stating that his A1C is 6.7 now and he is ready to schedule his TKA. ?His PCP is [facility]. ? ?CB [phone] ?

## 2021-09-03 NOTE — Telephone Encounter (Signed)
I called and spoke with wife.  I was able to see A1c through Care [facility]where.  We can go ahead and schedule TKA.  She will call me back tomorrow to discuss dates. ?

## 2021-09-19 ENCOUNTER — Ambulatory Visit: Payer: Medicare Other | Admitting: Orthopaedic Surgery

## 2021-10-02 NOTE — Progress Notes (Addendum)
Surgical Instructions ? ? ? Your procedure is scheduled on Monday May 15. ? Report to [facility] Main Entrance "A" at 10:30 A.M., then check in with the Admitting office. ? Call this number if you have problems the morning of surgery: ? [phone] ? ? If you have any questions prior to your surgery date call [phone]: Open Monday-Friday 8am-4pm ? ? ? Remember: ? Do not eat after midnight the night before your surgery ? ?You may drink clear liquids until 9:30am the morning of your surgery.   ?Clear liquids allowed are: Water, Non-Citrus Juices (without pulp), Carbonated Beverages, Clear Tea, Black Coffee ONLY (NO MILK, CREAM OR POWDERED CREAMER of any kind), and Gatorade ?Patient Instructions ? ?The night before surgery:  ?No food after midnight. ONLY clear liquids after midnight ? ?The day of surgery (if you have diabetes): ?Drink ONE (1) 12 oz G2 given to you in your pre admission testing appointment by 9:30am the morning of surgery. Drink in one sitting. Do not sip. 9#) ?This drink was given to you during your hospital  ?pre-op appointment visit.  ?Nothing else to drink after completing the  ?12 oz bottle of G2 ? ?       If you have questions, please contact your surgeon?s office.  ? ?  ? Take these medicines the morning of surgery with A SIP OF WATER:  ?amLODipine (NORVASC) ?atorvastatin (LIPITOR) ?DULoxetine (CYMBALTA)  ?loratadine (CLARITIN)  ?metoCLOPramide (REGLAN)  ?pantoprazole (PROTONIX) ?rOPINIRole (REQUIP) ?  ?IF NEEDED TAKE: ?albuterol (PROVENTIL HFA;VENTOLIN HFA) 108 (90 Base) MCG/ACT inhaler and bring to the hospital with you. ?mometasone (NASONEX)  ? ?As of today, STOP taking any Aspirin (unless otherwise instructed by your surgeon) Aleve, Naproxen, Ibuprofen, Motrin, Advil, Goody's, BC's, all herbal medications, fish oil, and all vitamins. ? ?WHAT DO I DO ABOUT MY DIABETES MEDICATION? ? ?THE MORNING OF SURGERY, take 14 units of LANTUS SOLOSTAR insulin. ? ? ?HOW TO MANAGE YOUR DIABETES ?BEFORE  AND AFTER SURGERY ? ?Why is it important to control my blood sugar before and after surgery? ?Improving blood sugar levels before and after surgery helps healing and can limit problems. ?A way of improving blood sugar control is eating a healthy diet by: ? Eating less sugar and carbohydrates ? Increasing activity/exercise ? Talking with your doctor about reaching your blood sugar goals ?High blood sugars (greater than 180 mg/dL) can raise your risk of infections and slow your recovery, so you will need to focus on controlling your diabetes during the weeks before surgery. ?Make sure that the doctor who takes care of your diabetes knows about your planned surgery including the date and location. ? ?How do I manage my blood sugar before surgery? ?Check your blood sugar at least 4 times a day, starting 2 days before surgery, to make sure that the level is not too high or low. ? ?Check your blood sugar the morning of your surgery when you wake up and every 2 hours until you get to the Short Stay unit. ? ?If your blood sugar is less than 70 mg/dL, you will need to treat for low blood sugar: ?Do not take insulin. ?Treat a low blood sugar (less than 70 mg/dL) with ? cup of clear juice (cranberry or apple), 4 glucose tablets, OR glucose gel. ?Recheck blood sugar in 15 minutes after treatment (to make sure it is greater than 70 mg/dL). If your blood sugar is not greater than 70 mg/dL on recheck, call [phone] for further instructions. ?Report your blood  sugar to the short stay nurse when you get to Short Stay. ? ?If you are admitted to the hospital after surgery: ?Your blood sugar will be checked by the staff and you will probably be given insulin after surgery (instead of oral diabetes medicines) to make sure you have good blood sugar levels. ?The goal for blood sugar control after surgery is 80-180 mg/dL.        ?Do not wear jewelry or makeup ?Do not wear lotions, powders, perfumes/colognes, or deodorant. ?Do not shave  48 hours prior to surgery.  Men may shave face and neck. ?Do not bring valuables to the hospital. ?Do not wear nail polish, gel polish, artificial nails, or any other type of covering on natural nails (fingers and toes) ?If you have artificial nails or gel coating that need to be removed by a nail salon, please have this removed prior to surgery. Artificial nails or gel coating may interfere with anesthesia's ability to adequately monitor your vital signs. ? ?[facility] is not responsible for any belongings or valuables. .  ? ?Do NOT Smoke (Tobacco/Vaping)  24 hours prior to your procedure ? ?If you use a CPAP at night, you may bring your mask for your overnight stay. ?  ?Contacts, glasses, hearing aids, dentures or partials may not be worn into surgery, please bring cases for these belongings ?  ?For patients admitted to the hospital, discharge time will be determined by your treatment team. ?  ?Patients discharged the day of surgery will not be allowed to drive home, and someone needs to stay with them for 24 hours. ? ? ?SURGICAL WAITING ROOM VISITATION ?Patients having surgery or a procedure in a hospital may have two support people. ?Children under the age of 46 must have an adult with them who is not the patient. ?They may stay in the waiting area during the procedure and may switch out with other visitors. If the patient needs to stay at the hospital during part of their recovery, the visitor guidelines for inpatient rooms apply. ? ?Please refer to the [facility] website for the visitor guidelines for Inpatients (after your surgery is over and you are in a regular room).  ? ? ? ? ? ?Special instructions:   ? ?Oral Hygiene is also important to reduce your risk of infection.  Remember - BRUSH YOUR TEETH THE MORNING OF SURGERY WITH YOUR REGULAR TOOTHPASTE ? ? ?[facility]- Preparing For Surgery ? ?Before surgery, you can play an important role. Because skin is not sterile, your skin needs to be as free of germs  as possible. You can reduce the number of germs on your skin by washing with CHG (chlorahexidine gluconate) Soap before surgery.  CHG is an antiseptic cleaner which kills germs and bonds with the skin to continue killing germs even after washing.   ? ? ?Please do not use if you have an allergy to CHG or antibacterial soaps. If your skin becomes reddened/irritated stop using the CHG.  ?Do not shave (including legs and underarms) for at least 48 hours prior to first CHG shower. It is OK to shave your face. ? ?Please follow these instructions carefully. ?  ? ? Shower the NIGHT BEFORE SURGERY and the MORNING OF SURGERY with CHG Soap.  ? If you chose to wash your hair, wash your hair first as usual with your normal shampoo. After you shampoo, rinse your hair and body thoroughly to remove the shampoo.  Then [employer] and genitals (private parts) with  your normal soap and rinse thoroughly to remove soap. ? ?After that Use CHG Soap as you would any other liquid soap. You can apply CHG directly to the skin and wash gently with a scrungie or a clean washcloth.  ? ?Apply the CHG Soap to your body ONLY FROM THE NECK DOWN.  Do not use on open wounds or open sores. Avoid contact with your eyes, ears, mouth and genitals (private parts). Wash Face and genitals (private parts)  with your normal soap.  ? ?Wash thoroughly, paying special attention to the area where your surgery will be performed. ? ?Thoroughly rinse your body with warm water from the neck down. ? ?DO NOT shower/wash with your normal soap after using and rinsing off the CHG Soap. ? ?Pat yourself dry with a CLEAN TOWEL. ? ?Wear CLEAN PAJAMAS to bed the night before surgery ? ?Place CLEAN SHEETS on your bed the night before your surgery ? ?DO NOT SLEEP WITH PETS. ? ? ?Day of Surgery: ? ?Take a shower with CHG soap. ?Wear Clean/Comfortable clothing the morning of surgery ?Do not apply any deodorants/lotions.   ?Remember to brush your teeth WITH YOUR REGULAR  TOOTHPASTE. ? ? ? ?If you received a COVID test during your pre-op visit, it is requested that you wear a mask when out in public, stay away from anyone that may not be feeling well, and notify your surgeon if you

## 2021-10-03 ENCOUNTER — Encounter: Payer: Self-pay | Admitting: Surgery

## 2021-10-03 ENCOUNTER — Telehealth: Payer: Self-pay | Admitting: *Deleted

## 2021-10-03 ENCOUNTER — Other Ambulatory Visit: Payer: Self-pay

## 2021-10-03 ENCOUNTER — Ambulatory Visit (INDEPENDENT_AMBULATORY_CARE_PROVIDER_SITE_OTHER): Payer: Medicare Other | Admitting: Surgery

## 2021-10-03 ENCOUNTER — Encounter (HOSPITAL_COMMUNITY): Payer: Self-pay

## 2021-10-03 ENCOUNTER — Ambulatory Visit (HOSPITAL_COMMUNITY)
Admission: RE | Admit: 2021-10-03 | Discharge: 2021-10-03 | Disposition: A | Discharge status: Home / Self Care | Payer: Medicare Other | Source: Ambulatory Visit | Attending: Surgery | Admitting: Surgery

## 2021-10-03 ENCOUNTER — Encounter (HOSPITAL_COMMUNITY)
Admission: RE | Admit: 2021-10-03 | Discharge: 2021-10-03 | Disposition: A | Discharge status: Home / Self Care | Payer: Medicare Other | Source: Ambulatory Visit | Attending: Orthopaedic Surgery | Admitting: Orthopaedic Surgery

## 2021-10-03 VITALS — BP 133/69 | HR 66 | Ht 71.0 in | Wt 215.0 lb

## 2021-10-03 DIAGNOSIS — Z8546 Personal history of malignant neoplasm of prostate: Secondary | ICD-10-CM | POA: Diagnosis not present

## 2021-10-03 DIAGNOSIS — M25562 Pain in left knee: Secondary | ICD-10-CM

## 2021-10-03 DIAGNOSIS — Z955 Presence of coronary angioplasty implant and graft: Secondary | ICD-10-CM | POA: Insufficient documentation

## 2021-10-03 DIAGNOSIS — I2510 Atherosclerotic heart disease of native coronary artery without angina pectoris: Secondary | ICD-10-CM | POA: Diagnosis not present

## 2021-10-03 DIAGNOSIS — Z87891 Personal history of nicotine dependence: Secondary | ICD-10-CM | POA: Diagnosis not present

## 2021-10-03 DIAGNOSIS — K30 Functional dyspepsia: Secondary | ICD-10-CM | POA: Insufficient documentation

## 2021-10-03 DIAGNOSIS — Z794 Long term (current) use of insulin: Secondary | ICD-10-CM | POA: Diagnosis not present

## 2021-10-03 DIAGNOSIS — Z01818 Encounter for other preprocedural examination: Secondary | ICD-10-CM

## 2021-10-03 DIAGNOSIS — I10 Essential (primary) hypertension: Secondary | ICD-10-CM | POA: Insufficient documentation

## 2021-10-03 DIAGNOSIS — M1712 Unilateral primary osteoarthritis, left knee: Secondary | ICD-10-CM | POA: Diagnosis not present

## 2021-10-03 DIAGNOSIS — E119 Type 2 diabetes mellitus without complications: Secondary | ICD-10-CM | POA: Insufficient documentation

## 2021-10-03 DIAGNOSIS — Z9989 Dependence on other enabling machines and devices: Secondary | ICD-10-CM | POA: Insufficient documentation

## 2021-10-03 DIAGNOSIS — G2581 Restless legs syndrome: Secondary | ICD-10-CM | POA: Insufficient documentation

## 2021-10-03 DIAGNOSIS — Z9079 Acquired absence of other genital organ(s): Secondary | ICD-10-CM | POA: Diagnosis not present

## 2021-10-03 DIAGNOSIS — G4733 Obstructive sleep apnea (adult) (pediatric): Secondary | ICD-10-CM | POA: Insufficient documentation

## 2021-10-03 DIAGNOSIS — K219 Gastro-esophageal reflux disease without esophagitis: Secondary | ICD-10-CM | POA: Diagnosis not present

## 2021-10-03 DIAGNOSIS — Z96651 Presence of right artificial knee joint: Secondary | ICD-10-CM | POA: Insufficient documentation

## 2021-10-03 LAB — BASIC METABOLIC PANEL
Anion gap: 6 (ref 5–15)
BUN: 22 mg/dL (ref 8–23)
CO2: 29 mmol/L (ref 22–32)
Calcium: 9.5 mg/dL (ref 8.9–10.3)
Chloride: 104 mmol/L (ref 98–111)
Creatinine, Ser: 0.93 mg/dL (ref 0.61–1.24)
GFR, Estimated: >60 mL/min (ref >60)
Glucose, Bld: 147 mg/dL — ABNORMAL HIGH (ref 70–99)
Potassium: 4.2 mmol/L (ref 3.5–5.1)
Sodium: 139 mmol/L (ref 135–145)

## 2021-10-03 LAB — CBC
HCT: 41.9 % (ref 39.0–52.0)
Hemoglobin: 14.3 g/dL (ref 13.0–17.0)
MCH: 33.6 pg (ref 26.0–34.0)
MCHC: 34.1 g/dL (ref 30.0–36.0)
MCV: 98.6 fL (ref 80.0–100.0)
Platelets: 194 10*3/uL (ref 150–400)
RBC: 4.25 MIL/uL (ref 4.22–5.81)
RDW: 11.5 % (ref 11.5–15.5)
WBC: 6.8 10*3/uL (ref 4.0–10.5)
nRBC: 0 % (ref 0.0–0.2)

## 2021-10-03 LAB — SURGICAL PCR SCREEN
MRSA, PCR: NEGATIVE
Staphylococcus aureus: NEGATIVE

## 2021-10-03 LAB — GLUCOSE, CAPILLARY: Glucose-Capillary: 182 mg/dL — ABNORMAL HIGH (ref 70–99)

## 2021-10-03 NOTE — Telephone Encounter (Signed)
? ?  Pre-operative Risk Assessment  ?  ?Patient Name: Johnny Huber  ?DOB: [birth year] ?MRN: [id number]  ? ?  ?CLEARANCE REQUEST STATES URGENT ?Request for Surgical Clearance   ? ?Procedure:   LEFT TOTAL KNEE ARTHROPLASTY ? ?Date of Surgery:  Clearance 10/07/21 URGENT                             ?   ?Surgeon:  DR. MARK YATES ?Surgeon's Group or Practice Name:  [facility] ?Phone number:  [phone] ?Fax number:  [phone] ATTN: SHERRIE ?336-  ?Type of Clearance Requested:   ?- Medical ; PT ON ASA; NOT REQUESTED TO BE HELD ON CLEARANCE FORM ?  ?Type of Anesthesia:  Spinal & BLOCK ?  ?Additional requests/questions:   ? ?Signed, ?Julaine Hua   ?10/03/2021, 12:37 PM  ? ?

## 2021-10-03 NOTE — Telephone Encounter (Signed)
I will have to send a message to the [place] office to see where they may be able to get the pt squeezed in. Pt's procedure may need to be postponed until seen by cardiology.  ?

## 2021-10-03 NOTE — Progress Notes (Signed)
76 year old white male with history of end-stage DJD left knee and pain comes in for prep evaluation.  States that knee pain unchanged from previous visit and he is wanting to proceed with left total knee replacement as scheduled.  Today history and physical performed.  Review of systems negative.  Patient has history of coronary artery disease with previous stents.  Followed by cardiologist Dr. Carlyle Dolly.  Last seen by him Oct 15, 2020.  Cardiac clearance not requested by Dr. Lorin Mercy.  Nitroglycerin is on patient's med list but he states that he does not keep this on hand. ? ?Plan ?Surgical procedure discussed with patient along with potential hospital stay.  I asked my surgery scheduler to get cardiac clearance from Dr. Harl Bowie.  I did make patient aware of this as well.  I encouraged him to make sure that he keeps nitroglycerin tablets on him at all times.  All questions answered. ?

## 2021-10-03 NOTE — Telephone Encounter (Signed)
I have s/w the pt in regard to him needing an appt for pre op clearance. Pt last seen 09/2020. Pt will need in office appt. Pt generally seen in the [place] office. Our [place] office and  locations are both full schedules. Pt is agreeable to come to [place]. Earliest appt is 10/09/21 @ 1:55 with Fabian Sharp at [facility] location. Pt agreeable and thanked me for the help. I have also placed the pt on a cancellation list. I then called and s/w Sherrie with Dr. Lorin Mercy office and informed that the surgery will need to be postponed until seen by cardiology. Sherrie aware of 10/09/21 appt.  ? ?Pt is not happy, though he did understand.  ?

## 2021-10-03 NOTE — Telephone Encounter (Signed)
Primary Cardiologist:Branch, Roderic Palau, MD ? ?Chart reviewed as part of pre-operative protocol coverage. Because of Johnny Huber's past medical history and time since last visit, he/she will require a follow-up visit in order to better assess preoperative cardiovascular risk. ? ?Pre-op covering staff: ?- Please schedule appointment and call patient to inform them. ?- Please contact requesting surgeon's office via preferred method (i.e, phone, fax) to inform them of need for appointment prior to surgery. ? ?If applicable, this message will also be routed to pharmacy pool and/or primary cardiologist for input on holding anticoagulant/antiplatelet agent as requested below so that this information is available at time of patient's appointment.  ? ?Deberah Pelton, NP  ?10/03/2021, 12:43 PM  ? ?

## 2021-10-03 NOTE — Progress Notes (Signed)
PCP - Karie Schwalbe. Hemberg NP ?Cardiologist - Carlyle Dolly MD ? ?PPM/ICD - denies ?Device Orders -  ?Rep Notified -  ? ?Chest x-ray - 10/03/21 ?EKG - 10/03/21 ?Stress Test - 03/16/14 ?ECHO - 03/2014 ?Cardiac Cath - 03/31/2014 ? ?Sleep Study - yes-no documents found. Pt was not sure when it was.  ?CPAP - pt has CPAP but says he doesn't use it every night. ? ?Fasting Blood Sugar - 110-130 ?Checks Blood Sugar every morning ? ?Blood Thinner Instructions:na ?Aspirin Instructions: per Dr. Narda Amber instructions. Pt states he will call Dr. Lorin Mercy today for aspirin instructions.  ? ?ERAS Protcol -clear liquids until 0930 ?PRE-SURGERY Ensure or G2- G2 ? ?COVID TEST- na ? ? ?Anesthesia review: yes cardiac history ? ?Patient denies shortness of breath, fever, cough and chest pain at PAT appointment ? ? ?All instructions explained to the patient, with a verbal understanding of the material. Patient agrees to go over the instructions while at home for a better understanding. Patient also instructed to wear a mask when out in public prior to surgery.. The opportunity to ask questions was provided. ?  ?

## 2021-10-04 ENCOUNTER — Ambulatory Visit (INDEPENDENT_AMBULATORY_CARE_PROVIDER_SITE_OTHER): Payer: Medicare Other | Admitting: Physician Assistant

## 2021-10-04 ENCOUNTER — Encounter: Payer: Self-pay | Admitting: Physician Assistant

## 2021-10-04 VITALS — BP 120/78 | Ht 71.0 in | Wt 213.4 lb

## 2021-10-04 DIAGNOSIS — Z01810 Encounter for preprocedural cardiovascular examination: Secondary | ICD-10-CM

## 2021-10-04 DIAGNOSIS — I10 Essential (primary) hypertension: Secondary | ICD-10-CM | POA: Diagnosis not present

## 2021-10-04 DIAGNOSIS — E785 Hyperlipidemia, unspecified: Secondary | ICD-10-CM | POA: Diagnosis not present

## 2021-10-04 DIAGNOSIS — I2510 Atherosclerotic heart disease of native coronary artery without angina pectoris: Secondary | ICD-10-CM | POA: Diagnosis not present

## 2021-10-04 DIAGNOSIS — E1159 Type 2 diabetes mellitus with other circulatory complications: Secondary | ICD-10-CM

## 2021-10-04 NOTE — Anesthesia Preprocedure Evaluation (Addendum)
Anesthesia Evaluation  ?Patient identified by MRN, date of birth, ID band ?Patient awake ? ? ? ?Reviewed: ?Allergy & Precautions, NPO status , Patient's Chart, lab work & pertinent test results ? ?Airway ?Mallampati: II ? ?TM Distance: >3 FB ?Neck ROM: Full ? ? ? Dental ? ?(+) Dental Advisory Given, Missing, Chipped,  ?  ?Pulmonary ?asthma , sleep apnea and Continuous Positive Airway Pressure Ventilation , former smoker,  ?  ?Pulmonary exam normal ?breath sounds clear to auscultation ? ? ? ? ? ? Cardiovascular ?hypertension, Pt. on medications ?(-) angina+ CAD and + Cardiac Stents  ?(-) Past MI Normal cardiovascular exam ?Rhythm:Regular Rate:Normal ? ? ?  ?Neuro/Psych ? Neuromuscular disease negative psych ROS  ? GI/Hepatic ?Neg liver ROS, GERD  Medicated,  ?Endo/Other  ?diabetes, Type 2, Insulin Dependent ? Renal/GU ?negative Renal ROS  ? ?  ?Musculoskeletal ? ?(+) Arthritis ,  ? Abdominal ?  ?Peds ? Hematology ?negative hematology ROS ?(+)   ?Anesthesia Other Findings ?Day of surgery medications reviewed with the patient. ? Reproductive/Obstetrics ? ?  ? ? ? ? ? ? ? ? ? ? ? ? ? ?  ?  ? ? ? ? ? ? ? ?Anesthesia Physical ?Anesthesia Plan ? ?ASA: 3 ? ?Anesthesia Plan: Spinal  ? ?Post-op Pain Management: Regional block* and Tylenol PO (pre-op)*  ? ?Induction: Intravenous ? ?PONV Risk Score and Plan: 1 and TIVA, Treatment may vary due to age or medical condition, Dexamethasone and Ondansetron ? ?Airway Management Planned: Natural Airway and Nasal Cannula ? ?Additional Equipment:  ? ?Intra-op Plan:  ? ?Post-operative Plan:  ? ?Informed Consent: I have reviewed the patients History and Physical, chart, labs and discussed the procedure including the risks, benefits and alternatives for the proposed anesthesia with the patient or authorized representative who has indicated his/her understanding and acceptance.  ? ? ? ?Dental advisory given ? ?Plan Discussed with: CRNA, Anesthesiologist and  Surgeon ? ?Anesthesia Plan Comments: (PAT note written 10/04/2021 by Myra Gianotti, PA-C. 10/03/21 CXR report in process. ?)  ? ? ? ? ? ?Anesthesia Quick Evaluation ? ?

## 2021-10-04 NOTE — Patient Instructions (Signed)
Medication Instructions:  ?Your physician recommends that you continue on your current medications as directed. Please refer to the Current Medication list given to you today. ? ?*If you need a refill on your cardiac medications before your next appointment, please call your pharmacy* ? ?Lab Work: ?Your physician recommends that you return for lab work TODAY:  ?[place]  ?Direct LDL ? ?If you have labs (blood work) drawn today and your tests are completely normal, you will receive your results only by: ?MyChart Message (if you have MyChart) OR ?A paper copy in the mail ?If you have any lab test that is abnormal or we need to change your treatment, we will call you to review the results. ? ?Testing/Procedures: ?NONE ordered at this time of appointment  ? ?Follow-Up: ?At [facility], you and your health needs are our priority.  As part of our continuing mission to provide you with exceptional heart care, we have created designated Provider Care Teams.  These Care Teams include your primary Cardiologist (physician) and Advanced Practice Providers (APPs -  Physician Assistants and Nurse Practitioners) who all work together to provide you with the care you need, when you need it. ? ?We recommend signing up for the patient portal called "MyChart".  Sign up information is provided on this After Visit Summary.  MyChart is used to connect with patients for Virtual Visits (Telemedicine).  Patients are able to view lab/test results, encounter notes, upcoming appointments, etc.  Non-urgent messages can be sent to your provider as well.   ?To learn more about what you can do with MyChart, go to [email or web address].   ? ?Your next appointment:   ?1 year(s) ? ?The format for your next appointment:   ?In Person ? ?Provider:   ?Carlyle Dolly, MD  ? ? ?Other Instructions ? ? ?Important Information About Sugar ? ? ? ? ? ? ?

## 2021-10-04 NOTE — Progress Notes (Addendum)
Anesthesia Chart Review: ? Case: [id number] Date/Time: 10/07/21 1215  ? Procedure: LEFT TOTAL KNEE ARTHROPLASTY (Left: Knee)  ? Anesthesia type: Spinal  ? Pre-op diagnosis: left knee osteoarthritis  ? [facility] ROOM 05 / [facility]  ? Surgeons: [name], MD  ? ?  ? ? ?DISCUSSION: Patient is a 76 year old male scheduled for the above procedure. ? ?History includes former smoker (quit 09/01/82), HTN, CAD (03/31/14 [facility]: 85% huge RCA, 10-20% PDA, CTO oLAD with collaterals, 90% small OM, EF 55%, s/p BMS RCA), GERD, DM2, OSA (uses CPAP inconsistently), RLS, prostate cancer (s/p prostatectomy 2011), osteoarthritis (s/p right TKA 12/09/17). He had markedly delayed gastric emptying on 2022 nuclear scan.  ? ?Preoperative cardiology evaluation on 10/04/21 by Fabian Sharp, PA and wrote, "Preoperative risk evaluation for Mace ?Multivessel disease, history of BMS to RCA, known occluded LAD and circumflex.  He has normal renal function but does require insulin.  He is at least moderate risk for this low risk procedure, according to the RCRI he has a 6.6% risk of Mace. He understands his risk and wishes to proceed.  ?  ?Therefore, based on ACC/AHA guidelines, the patient would be at acceptable risk for the planned procedure without further cardiovascular testing.  ?  ?The patient was advised that if he develops new symptoms prior to surgery to contact our office to arrange for a follow-up visit, and he verbalized understanding. ?  ?Given known multivessel disease, would continue 81 mg aspirin throughout the perioperative period."  Follow-up in 1 year recommended. ? ?Anesthesia team to evaluate on the day of surgery. 10/03/21 CXR is in process. ? ? ?VS: BP 134/79   Pulse 64   Temp 36.7 ?C (Oral)   Resp 17   SpO2 99%  ? ? ?PROVIDERS: ?Hemberg, Karie Schwalbe, NP is PCP  ?Carlyle Dolly, MD is cardiologist ? ? ?LABS: Labs reviewed: Acceptable for surgery. A1c 6.7% on 08/14/21 ([facility]). LFTs on 03/01/21 ([facility]) showed AST 27, ALT  32, Alk Phos 164 (H), total bili 0.8.   ?(all labs ordered are listed, but only abnormal results are displayed) ? ?Labs Reviewed  ?GLUCOSE, CAPILLARY - Abnormal; Notable for the following components:  ?    Result Value  ? Glucose-Capillary 182 (*)   ? All other components within normal limits  ?BASIC METABOLIC PANEL - Abnormal; Notable for the following components:  ? Glucose, Bld 147 (*)   ? All other components within normal limits  ?SURGICAL PCR SCREEN  ?CBC  ? ? ? ?IMAGES: ?CXR 10/03/21: In process. ? ?MRI Left knee 05/30/21: ?IMPRESSION: ?1. Unfortunately, this study is technically degraded by patient ?motion artifact, despite repetition of multiple sequences. ?2. Superior articular surface tear of the posterior horn and ?posterior segment of the body of the medial meniscus. ?3. Cartilage degenerative changes greatest within the medial and ?patellofemoral compartments. ?4. Moderate joint effusion. ? ?NM Gastric Emptying Scan 12/31/20 ([facility]): ?IMPRESSION:  Markedly delayed gastric emptying.   ? ? ?EKG: 10/03/21: ?Sinus rhythm with 1st degree A-V block ?Left axis deviation ?Abnormal ECG ?Confirmed by Eleonore Chiquito [phone]) on 10/03/2021 9:14:06 PM ?- PR interval has increased since 04/06/20 tracing. ? ? ?CV: ?[place] Abd Aorta 03/22/21 ([facility]): ?IMPRESSION:  ?Appropriate taper of the abdominal aorta without aneurysm.   ? ?Cardiac cath 03/31/14 ([facility]): ?CORONARY Anatomy:      ?*  LMain:Mild nonobstructive disease  ?*  LAD: Totally occluded ostial. The LAD fills retrograde from the right coronary artery although we  up to the ostial disease  ?*  Small first and second diagonals are normal  ?*  LZJ:QBHAL caliber. Mild nonobstructive disease.  ?*  OM: 90% in a small obtuse one  ?*  PFX:TKWI, dominant vessel. 80% in the mid to proximal vessel, the distal RCA get strong collaterals to the obtuse marginals in the LAD  ?*  OXB:DZHG 10-20% wall irregularities  ?*  EF: 55% with mild anterolateral hypokinesia   ? ?Impression:  ?1.  Totally occluded LAD with strong right to left collaterals ?2.  Severe disease in the circumflex but small caliber vessel ?3.  Severe disease in the future dominant right coronary artery that was responsible for collaterals to the LAD. ?4.  Successful direct stenting with a bare-metal stent of the RCA ?5.  Preserved left ventricular function ? ?Echo 03/28/14 ([facility]): As outlined in 11/30/14 [facility] note [facility]: "echo: 10/15: EF 55,anteroapical AK, no valve dz" ? ? ?Past Medical History:  ?Diagnosis Date  ? Asthmatic bronchitis   ? as a child  ? Coronary atherosclerosis of native coronary artery   ? BMS to RCA 2015  ? DDD (degenerative disc disease), lumbar   ? Diabetes ([place])   ? Gastroesophageal reflux disease   ? Hypertension   ? Insomnia, unspecified   ? Obstructive sleep apnea   ? uses cpap  ? Pneumonia   ? Prostate cancer ([place])   ? had prostate removed  ? Restless legs syndrome   ? Rhinitis, allergic   ? ? ?Past Surgical History:  ?Procedure Laterality Date  ? CARDIAC CATHETERIZATION  2015  ? CORONARY ANGIOPLASTY    ? OTHER SURGICAL HISTORY  1983  ? R KNEE ARTHORSCOPY x 3  ? PROSTATECTOMY  2011  ? TONSILLECTOMY    ? TOTAL KNEE ARTHROPLASTY Right 12/09/2017  ? CEMENTED   ? TOTAL KNEE ARTHROPLASTY Right 12/09/2017  ? Procedure: RIGHT TOTAL KNEE ARTHROPLASTY-CEMENTED;  Surgeon: [name], MD;  Location: [facility];  Service: Orthopedics;  Laterality: Right;  ? ? ?MEDICATIONS: ? albuterol (PROVENTIL HFA;VENTOLIN HFA) 108 (90 Base) MCG/ACT inhaler  ? amLODipine (NORVASC) 5 MG tablet  ? aspirin EC 81 MG tablet  ? atorvastatin (LIPITOR) 80 MG tablet  ? BD VEO INSULIN SYRINGE U/F 31G X 15/64" 0.3 ML MISC  ? Cyanocobalamin (B-12 COMPLIANCE INJECTION IJ)  ? diclofenac (VOLTAREN) 75 MG EC tablet  ? DULoxetine (CYMBALTA) 30 MG capsule  ? glucose blood (FREESTYLE LITE) test strip  ? Insulin Pen Needle (B-D ULTRAFINE III SHORT PEN) 31G X 8 MM MISC  ? LANTUS SOLOSTAR 100 UNIT/ML Solostar Pen  ?  linaclotide (LINZESS) 72 MCG capsule  ? loratadine (CLARITIN) 10 MG tablet  ? losartan (COZAAR) 50 MG tablet  ? metoCLOPramide (REGLAN) 10 MG tablet  ? mometasone (NASONEX) 50 MCG/ACT nasal spray  ? Multiple Vitamins-Minerals (MULTIVITAMIN WITH MINERALS) tablet  ? nitroGLYCERIN (NITROSTAT) 0.4 MG SL tablet  ? ondansetron (ZOFRAN) 8 MG tablet  ? pantoprazole (PROTONIX) 40 MG tablet  ? rOPINIRole (REQUIP) 1 MG tablet  ? ?No current facility-administered medications for this encounter.  ? ? ?Myra Gianotti, PA-C ?Surgical Short Stay/Anesthesiology ?[facility] Phone [phone] ?[facility] Phone [phone] ?10/04/2021 5:40 PM ? ? ? ? ? ? ?

## 2021-10-04 NOTE — Progress Notes (Signed)
?Cardiology Office Note:   ? ?Date:  10/04/2021  ? ?ID:  Johnny Huber, DOB 05-26-1946, MRN [id number] ? ?PCP:  Bridget Hartshorn, NP ?  ?[facility] Providers ?Cardiologist:  Carlyle Dolly, MD    ? ?Referring MD: Bridget Hartshorn, NP  ? ?Chief Complaint  ?Patient presents with  ? Pre-op Exam  ?  CAD  ? ? ?History of Present Illness:   ? ?Johnny Huber is a 76 y.o. male with a hx of CAD, OSA on CPAP, gastroparesis, hypertension, hyperlipidemia, DM, GERD, and prostate cancer. ? ?He previously followed with [facility] cardiology.  He had an abnormal stress test leading to heart catheterization in 2015 with subsequent PCI to RCA.  He had a totally occluded LAD with strong right to left collaterals.  Severe disease in the circumflex-was a small caliber vessel not felt amenable to PCI.  Severe disease in the RCA treated with BMS.  He had documented normal LVEF at that time.  He has been maintained on 10 mg amlodipine and 50 mg losartan.  He continues aspirin 81 mg. He was last seen by Dr. Harl Bowie 10/15/20 and was doing well without angina.  ? ?Patient presents for preoperative risk evaluation for left total knee arthroplasty. He states he walks about 1 mile per day, usually a quarter mile at a time. He climbs a flight of steps several times per day. He had right TKA about 5 years prior without cardiac complications. Overall, no cardiac complaints.  ? ? ?Past Medical History:  ?Diagnosis Date  ? Asthmatic bronchitis   ? as a child  ? Coronary atherosclerosis of native coronary artery   ? BMS to RCA 2015  ? DDD (degenerative disc disease), lumbar   ? Diabetes ([place])   ? Gastroesophageal reflux disease   ? Hypertension   ? Insomnia, unspecified   ? Obstructive sleep apnea   ? uses cpap  ? Pneumonia   ? Prostate cancer ([place])   ? had prostate removed  ? Restless legs syndrome   ? Rhinitis, allergic   ? ? ?Past Surgical History:  ?Procedure Laterality Date  ? CARDIAC CATHETERIZATION  2015  ? CORONARY ANGIOPLASTY     ? OTHER SURGICAL HISTORY  1983  ? R KNEE ARTHORSCOPY x 3  ? PROSTATECTOMY  2011  ? TONSILLECTOMY    ? TOTAL KNEE ARTHROPLASTY Right 12/09/2017  ? CEMENTED   ? TOTAL KNEE ARTHROPLASTY Right 12/09/2017  ? Procedure: RIGHT TOTAL KNEE ARTHROPLASTY-CEMENTED;  Surgeon: [name], MD;  Location: [place];  Service: Orthopedics;  Laterality: Right;  ? ? ?Current Medications: ?Current Meds  ?Medication Sig  ? albuterol (PROVENTIL HFA;VENTOLIN HFA) 108 (90 Base) MCG/ACT inhaler Inhale 2 puffs into the lungs every 6 (six) hours as needed for wheezing or shortness of breath.  ? amLODipine (NORVASC) 5 MG tablet Take 5 mg by mouth daily.  ? aspirin EC 81 MG tablet Take 81 mg by mouth daily.  ? atorvastatin (LIPITOR) 80 MG tablet TAKE 1 TABLET DAILY  ? BD VEO INSULIN SYRINGE U/F 31G X 15/64" 0.3 ML MISC   ? Cyanocobalamin (B-12 COMPLIANCE INJECTION IJ) Inject as directed every 14 (fourteen) days.  ? diclofenac (VOLTAREN) 75 MG EC tablet TAKE ONE (1) TABLET BY MOUTH TWO (2) TIMES DAILY (Patient taking differently: 75 mg daily as needed (arthritis).)  ? DULoxetine (CYMBALTA) 30 MG capsule Take 30 mg by mouth daily.  ? glucose blood (FREESTYLE LITE) test strip Use as instructed bid  ? Insulin  Pen Needle (B-D ULTRAFINE III SHORT PEN) 31G X 8 MM MISC 1 each by Does not apply route as directed.  ? LANTUS SOLOSTAR 100 UNIT/ML Solostar Pen Inject 28 Units into the skin every morning.  ? linaclotide (LINZESS) 72 MCG capsule Take 72 mcg by mouth daily as needed (Constipation).  ? loratadine (CLARITIN) 10 MG tablet Take 10 mg by mouth daily.  ? losartan (COZAAR) 50 MG tablet TAKE 1 TABLET DAILY (NEW MEDICATION 01/20/18)  ? metoCLOPramide (REGLAN) 10 MG tablet Take 10 mg by mouth 3 (three) times daily with meals.  ? mometasone (NASONEX) 50 MCG/ACT nasal spray Place 2 sprays into the nose daily as needed (Congestion).  ? Multiple Vitamins-Minerals (MULTIVITAMIN WITH MINERALS) tablet Take 1 tablet by mouth daily. Men's  ? nitroGLYCERIN  (NITROSTAT) 0.4 MG SL tablet Place 0.4 mg under the tongue every 5 (five) minutes as needed for chest pain.  ? ondansetron (ZOFRAN) 8 MG tablet Take 8 mg by mouth 3 (three) times daily.  ? pantoprazole (PROTONIX) 40 MG tablet Take 40 mg by mouth daily.  ? rOPINIRole (REQUIP) 1 MG tablet Take 1 mg by mouth 2 (two) times daily.   ?  ? ?Allergies:   Patient has no known allergies.  ? ?Social History  ? ?Socioeconomic History  ? Marital status: Married  ?  Spouse name: Not on file  ? Number of children: Not on file  ? Years of education: Not on file  ? Highest education level: Not on file  ?Occupational History  ? Not on file  ?Tobacco Use  ? Smoking status: Former  ?  Types: Cigarettes  ?  Quit date: 09/01/1982  ?  Years since quitting: 39.1  ? Smokeless tobacco: Never  ?Vaping Use  ? Vaping Use: Never used  ?Substance and Sexual Activity  ? Alcohol use: No  ?  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 Drug use: No  ? Sexual activity: Yes  ?  Partners: Female  ?Other Topics Concern  ? Not on file  ?Social History Narrative  ? Not on file  ? ?Social Determinants of Health  ? ?Financial Resource Strain: Not on file  ?Food Insecurity: Not on file  ?Transportation Needs: Not on file  ?Physical Activity: Not on file  ?Stress: Not on file  ?Social Connections: Not on file  ?  ? ?Family History: ?The patient's family history includes Cancer in his sister. ? ?ROS:   ?Please see the history of present illness.    ? All other systems reviewed and are negative. ? ?EKGs/Labs/Other Studies Reviewed:   ? ?The following studies were reviewed today: ? ?03/2014 cath ?Impression:  ?1.  Totally occluded LAD with strong right to left collaterals ?2.  Severe disease in the circumflex but small caliber vessel ?3.  Severe disease in the future dominant right coronary artery that was responsible for collaterals to the LAD. ?4.  Successful direct stenting with a bare-metal stent of the RCA ?5.  Preserved left  ventricular function  ? ? ?EKG:  EKG is not ordered today. EKG from preop clinic yesterday reviewed.  ? ?Recent Labs: ?10/03/2021: BUN 22; Creatinine, Ser 0.93; Hemoglobin 14.3; Platelets 194; Potassium 4.2; Sodium 139  ?Recent Lipid Panel ?   ?Component Value Date/Time  ? CHOL 147 06/10/2016 0742  ? TRIG 132 06/10/2016 0742  ? HDL 30 (L) 06/10/2016 0742  ? CHOLHDL 4.9 06/10/2016 0742  ? VLDL 26 06/10/2016 0742  ?  [place] 91 06/10/2016 0742  ? ? ? ?Risk Assessment/Calculations:   ?  ? ?    ? ?Physical Exam:   ? ?VS:  BP 120/78   Ht '5\' 11"'$  (1.803 m)   Wt 213 lb 6.4 oz (96.8 kg)   SpO2 98%   BMI 29.76 kg/m?    ? ?Wt Readings from Last 3 Encounters:  ?10/04/21 213 lb 6.4 oz (96.8 kg)  ?10/03/21 215 lb (97.5 kg)  ?06/13/21 223 lb (101.2 kg)  ?  ? ?GEN:  Well nourished, well developed in no acute distress ?HEENT: Normal ?NECK: No JVD; No carotid bruits ?LYMPHATICS: No lymphadenopathy ?CARDIAC: RRR, no murmurs, rubs, gallops ?RESPIRATORY:  Clear to auscultation without rales, wheezing or rhonchi  ?ABDOMEN: Soft, non-tender, non-distended ?MUSCULOSKELETAL:  No edema; No deformity  ?SKIN: Warm and dry ?NEUROLOGIC:  Alert and oriented x 3 ?PSYCHIATRIC:  Normal affect  ? ?ASSESSMENT:   ? ?1. Coronary artery disease involving native coronary artery of native heart without angina pectoris   ?2. Hyperlipidemia LDL goal <70   ?3. Essential hypertension   ?4. Preoperative cardiovascular examination   ?5. DM type 2 causing vascular disease ([place])   ? ?PLAN:   ? ?In order of problems listed above: ? ?CAD ?Heart catheterization in 2015 following an abnormal nuclear stress test revealed severe multivessel disease with BMS to the RCA.  Totally occluded LAD and circumflex treated medically.  He does have collaterals. ?- Recommend lifelong aspirin ? ? ?Hypertension ?Maintained on amlodipine and losartan ?BP well-controlled, no changes ? ? ?Hyperlipidemia with LDL goal less than 55 ?Needs an updated lipid panel - will collect nonfasting  lipids today ?Continue 80 mg Lipitor ? ? ?IDDM ?Stable  ? ? ?Preoperative risk evaluation for Mace ?Multivessel disease, history of BMS to RCA, known occluded LAD and circumflex.  He has normal renal function but does

## 2021-10-05 LAB — LIPID PANEL
Chol/HDL Ratio: 3.1 ratio (ref 0.0–5.0)
Cholesterol, Total: 101 mg/dL (ref 100–199)
HDL: 33 mg/dL — ABNORMAL LOW (ref >39)
LDL Chol Calc (NIH): 50 mg/dL (ref 0–99)
Triglycerides: 89 mg/dL (ref 0–149)
VLDL Cholesterol Cal: 18 mg/dL (ref 5–40)

## 2021-10-05 LAB — LDL CHOLESTEROL, DIRECT: LDL Direct: 54 mg/dL (ref 0–99)

## 2021-10-07 ENCOUNTER — Ambulatory Visit (HOSPITAL_COMMUNITY): Payer: Medicare Other | Admitting: Vascular Surgery

## 2021-10-07 ENCOUNTER — Encounter (HOSPITAL_COMMUNITY)
Admission: RE | Disposition: A | Discharge status: Home Health | Payer: Self-pay | Source: Home / Self Care | Attending: Orthopaedic Surgery

## 2021-10-07 ENCOUNTER — Observation Stay (HOSPITAL_COMMUNITY)
Admission: RE | Admit: 2021-10-07 | Discharge: 2021-10-08 | Disposition: A | Discharge status: Home Health | Payer: Medicare Other | Attending: Orthopaedic Surgery | Admitting: Orthopaedic Surgery

## 2021-10-07 ENCOUNTER — Other Ambulatory Visit: Payer: Self-pay

## 2021-10-07 ENCOUNTER — Encounter (HOSPITAL_COMMUNITY): Payer: Self-pay | Admitting: Orthopaedic Surgery

## 2021-10-07 ENCOUNTER — Observation Stay (HOSPITAL_COMMUNITY): Payer: Medicare Other

## 2021-10-07 ENCOUNTER — Ambulatory Visit (HOSPITAL_BASED_OUTPATIENT_CLINIC_OR_DEPARTMENT_OTHER): Payer: Medicare Other | Admitting: Anesthesiology

## 2021-10-07 ENCOUNTER — Encounter: Payer: Self-pay | Admitting: *Deleted

## 2021-10-07 DIAGNOSIS — I2510 Atherosclerotic heart disease of native coronary artery without angina pectoris: Secondary | ICD-10-CM

## 2021-10-07 DIAGNOSIS — I10 Essential (primary) hypertension: Secondary | ICD-10-CM | POA: Diagnosis not present

## 2021-10-07 DIAGNOSIS — Z87891 Personal history of nicotine dependence: Secondary | ICD-10-CM | POA: Diagnosis not present

## 2021-10-07 DIAGNOSIS — J45909 Unspecified asthma, uncomplicated: Secondary | ICD-10-CM | POA: Insufficient documentation

## 2021-10-07 DIAGNOSIS — G4733 Obstructive sleep apnea (adult) (pediatric): Secondary | ICD-10-CM

## 2021-10-07 DIAGNOSIS — M1712 Unilateral primary osteoarthritis, left knee: Secondary | ICD-10-CM | POA: Diagnosis present

## 2021-10-07 DIAGNOSIS — Z8546 Personal history of malignant neoplasm of prostate: Secondary | ICD-10-CM | POA: Diagnosis not present

## 2021-10-07 DIAGNOSIS — Z01818 Encounter for other preprocedural examination: Secondary | ICD-10-CM

## 2021-10-07 DIAGNOSIS — E119 Type 2 diabetes mellitus without complications: Secondary | ICD-10-CM | POA: Diagnosis not present

## 2021-10-07 DIAGNOSIS — Z96651 Presence of right artificial knee joint: Secondary | ICD-10-CM | POA: Insufficient documentation

## 2021-10-07 HISTORY — PX: TOTAL KNEE ARTHROPLASTY: SHX125

## 2021-10-07 LAB — GLUCOSE, CAPILLARY
Glucose-Capillary: 168 mg/dL — ABNORMAL HIGH (ref 70–99)
Glucose-Capillary: 148 mg/dL — ABNORMAL HIGH (ref 70–99)
Glucose-Capillary: 137 mg/dL — ABNORMAL HIGH (ref 70–99)
Glucose-Capillary: 168 mg/dL — ABNORMAL HIGH (ref 70–99)
Glucose-Capillary: 288 mg/dL — ABNORMAL HIGH (ref 70–99)

## 2021-10-07 SURGERY — ARTHROPLASTY, KNEE, TOTAL
Anesthesia: Spinal | Site: Knee | Laterality: Left

## 2021-10-07 MED ORDER — EPHEDRINE 5 MG/ML INJ
INTRAVENOUS | Status: AC
  Filled 2021-10-07: qty 5

## 2021-10-07 MED ORDER — METOCLOPRAMIDE HCL 5 MG PO TABS: 5.0000 mg | ORAL_TABLET | Freq: Three times a day (TID) | ORAL | Status: DC | PRN

## 2021-10-07 MED ORDER — ALBUTEROL SULFATE (2.5 MG/3ML) 0.083% IN NEBU: 2.5000 mg | INHALATION_SOLUTION | Freq: Four times a day (QID) | RESPIRATORY_TRACT | Status: DC | PRN

## 2021-10-07 MED ORDER — OXYCODONE-ACETAMINOPHEN 5-325 MG PO TABS
1.0000 | ORAL_TABLET | ORAL | 0 refills | Status: DC | PRN
Start: 2021-10-07 — End: 2021-10-17

## 2021-10-07 MED ORDER — ROPINIROLE HCL 0.5 MG PO TABS
1.0000 mg | ORAL_TABLET | Freq: Two times a day (BID) | ORAL | Status: DC
  Administered 2021-10-07 – 2021-10-08 (×2): 1 mg via ORAL
  Filled 2021-10-07 (×2): qty 2

## 2021-10-07 MED ORDER — METHOCARBAMOL 1000 MG/10ML IJ SOLN: 500.0000 mg | Freq: Four times a day (QID) | INTRAVENOUS | Status: DC | PRN

## 2021-10-07 MED ORDER — LOSARTAN POTASSIUM 50 MG PO TABS
50.0000 mg | ORAL_TABLET | Freq: Every day | ORAL | Status: DC
  Administered 2021-10-08: 50 mg via ORAL
  Filled 2021-10-07 (×2): qty 1

## 2021-10-07 MED ORDER — PROPOFOL 10 MG/ML IV BOLUS
INTRAVENOUS | Status: AC
  Filled 2021-10-07: qty 20

## 2021-10-07 MED ORDER — ONDANSETRON HCL 4 MG/2ML IJ SOLN: 4.0000 mg | Freq: Once | INTRAMUSCULAR | Status: DC | PRN

## 2021-10-07 MED ORDER — INSULIN GLARGINE-YFGN 100 UNIT/ML ~~LOC~~ SOLN
28.0000 [IU] | Freq: Every morning | SUBCUTANEOUS | Status: DC
Start: 2021-10-08 — End: 2021-10-08
  Administered 2021-10-08: 28 [IU] via SUBCUTANEOUS
  Filled 2021-10-07 (×2): qty 0.28

## 2021-10-07 MED ORDER — PHENOL 1.4 % MT LIQD: 1.0000 | OROMUCOSAL | Status: DC | PRN

## 2021-10-07 MED ORDER — METHOCARBAMOL 500 MG PO TABS
500.0000 mg | ORAL_TABLET | Freq: Four times a day (QID) | ORAL | Status: DC | PRN
  Administered 2021-10-08: 500 mg via ORAL
  Filled 2021-10-07: qty 1

## 2021-10-07 MED ORDER — PANTOPRAZOLE SODIUM 40 MG PO TBEC
40.0000 mg | DELAYED_RELEASE_TABLET | Freq: Every day | ORAL | Status: DC
  Administered 2021-10-08: 40 mg via ORAL
  Filled 2021-10-07 (×2): qty 1

## 2021-10-07 MED ORDER — NITROGLYCERIN 0.4 MG SL SUBL: 0.4000 mg | SUBLINGUAL_TABLET | SUBLINGUAL | Status: DC | PRN

## 2021-10-07 MED ORDER — INSULIN ASPART 100 UNIT/ML IJ SOLN: 0.0000 [IU] | INTRAMUSCULAR | Status: DC | PRN

## 2021-10-07 MED ORDER — BUPIVACAINE HCL (PF) 0.25 % IJ SOLN
INTRAMUSCULAR | Status: DC | PRN
Start: 2021-10-07 — End: 2021-10-07
  Administered 2021-10-07: 20 mL

## 2021-10-07 MED ORDER — PHENYLEPHRINE 80 MCG/ML (10ML) SYRINGE FOR IV PUSH (FOR BLOOD PRESSURE SUPPORT)
PREFILLED_SYRINGE | INTRAVENOUS | Status: AC
  Filled 2021-10-07: qty 10

## 2021-10-07 MED ORDER — FENTANYL CITRATE (PF) 100 MCG/2ML IJ SOLN: 25.0000 ug | INTRAMUSCULAR | Status: DC | PRN

## 2021-10-07 MED ORDER — SODIUM CHLORIDE 0.9 % IR SOLN
Status: DC | PRN
Start: 2021-10-07 — End: 2021-10-07
  Administered 2021-10-07: 3000 mL

## 2021-10-07 MED ORDER — FLUTICASONE PROPIONATE 50 MCG/ACT NA SUSP
2.0000 | Freq: Every day | NASAL | Status: DC | PRN
Start: 2021-10-07 — End: 2021-10-08

## 2021-10-07 MED ORDER — LINACLOTIDE 72 MCG PO CAPS: 72.0000 ug | ORAL_CAPSULE | Freq: Every day | ORAL | Status: DC | PRN

## 2021-10-07 MED ORDER — METOCLOPRAMIDE HCL 5 MG PO TABS
10.0000 mg | ORAL_TABLET | Freq: Three times a day (TID) | ORAL | Status: DC
  Administered 2021-10-08 (×2): 10 mg via ORAL
  Filled 2021-10-07 (×2): qty 2

## 2021-10-07 MED ORDER — TRANEXAMIC ACID-NACL 1000-0.7 MG/100ML-% IV SOLN
INTRAVENOUS | Status: AC
  Filled 2021-10-07: qty 100

## 2021-10-07 MED ORDER — DEXAMETHASONE SODIUM PHOSPHATE 10 MG/ML IJ SOLN
INTRAMUSCULAR | Status: DC | PRN
Start: 2021-10-07 — End: 2021-10-07
  Administered 2021-10-07: 5 mg via INTRAVENOUS

## 2021-10-07 MED ORDER — METHOCARBAMOL 500 MG PO TABS: 500.0000 mg | ORAL_TABLET | Freq: Four times a day (QID) | ORAL | 0 refills | Status: AC | PRN

## 2021-10-07 MED ORDER — AMLODIPINE BESYLATE 5 MG PO TABS
5.0000 mg | ORAL_TABLET | Freq: Every day | ORAL | Status: DC
Start: 2021-10-07 — End: 2021-10-08
  Administered 2021-10-08: 5 mg via ORAL
  Filled 2021-10-07 (×2): qty 1

## 2021-10-07 MED ORDER — TRANEXAMIC ACID-NACL 1000-0.7 MG/100ML-% IV SOLN
INTRAVENOUS | Status: DC | PRN
  Administered 2021-10-07: 1000 mg via INTRAVENOUS

## 2021-10-07 MED ORDER — ALBUTEROL SULFATE HFA 108 (90 BASE) MCG/ACT IN AERS: 2.0000 | INHALATION_SPRAY | Freq: Four times a day (QID) | RESPIRATORY_TRACT | Status: DC | PRN

## 2021-10-07 MED ORDER — CEFAZOLIN SODIUM-DEXTROSE 2-4 GM/100ML-% IV SOLN
2.0000 g | INTRAVENOUS | Status: AC
  Administered 2021-10-07: 2 g via INTRAVENOUS
  Filled 2021-10-07: qty 100

## 2021-10-07 MED ORDER — ACETAMINOPHEN 325 MG PO TABS: 325.0000 mg | ORAL_TABLET | Freq: Four times a day (QID) | ORAL | Status: DC | PRN

## 2021-10-07 MED ORDER — POLYETHYLENE GLYCOL 3350 17 G PO PACK: 17.0000 g | PACK | Freq: Every day | ORAL | Status: DC | PRN

## 2021-10-07 MED ORDER — METOCLOPRAMIDE HCL 5 MG/ML IJ SOLN: 5.0000 mg | Freq: Three times a day (TID) | INTRAMUSCULAR | Status: DC | PRN

## 2021-10-07 MED ORDER — BUPIVACAINE LIPOSOME 1.3 % IJ SUSP
INTRAMUSCULAR | Status: DC | PRN
Start: 2021-10-07 — End: 2021-10-07
  Administered 2021-10-07: 20 mL

## 2021-10-07 MED ORDER — ASPIRIN EC 325 MG PO TBEC
325.0000 mg | DELAYED_RELEASE_TABLET | Freq: Every day | ORAL | Status: DC
  Administered 2021-10-08: 325 mg via ORAL
  Filled 2021-10-07: qty 1

## 2021-10-07 MED ORDER — CHLORHEXIDINE GLUCONATE 0.12 % MT SOLN
15.0000 mL | Freq: Once | OROMUCOSAL | Status: AC
  Administered 2021-10-07: 15 mL via OROMUCOSAL
  Filled 2021-10-07: qty 15

## 2021-10-07 MED ORDER — ATORVASTATIN CALCIUM 80 MG PO TABS
80.0000 mg | ORAL_TABLET | Freq: Every day | ORAL | Status: DC
  Administered 2021-10-08: 80 mg via ORAL
  Filled 2021-10-07 (×2): qty 1

## 2021-10-07 MED ORDER — BUPIVACAINE HCL (PF) 0.25 % IJ SOLN
INTRAMUSCULAR | Status: AC
  Filled 2021-10-07: qty 30

## 2021-10-07 MED ORDER — ONDANSETRON HCL 4 MG/2ML IJ SOLN: 4.0000 mg | Freq: Four times a day (QID) | INTRAMUSCULAR | Status: DC | PRN

## 2021-10-07 MED ORDER — SODIUM CHLORIDE 0.9 % IV SOLN: INTRAVENOUS | Status: DC

## 2021-10-07 MED ORDER — PHENYLEPHRINE HCL-NACL 20-0.9 MG/250ML-% IV SOLN
INTRAVENOUS | Status: DC | PRN
  Administered 2021-10-07: 40 ug/min via INTRAVENOUS

## 2021-10-07 MED ORDER — DEXAMETHASONE SODIUM PHOSPHATE 10 MG/ML IJ SOLN
INTRAMUSCULAR | Status: AC
  Filled 2021-10-07: qty 1

## 2021-10-07 MED ORDER — DOCUSATE SODIUM 100 MG PO CAPS
100.0000 mg | ORAL_CAPSULE | Freq: Two times a day (BID) | ORAL | Status: DC
  Administered 2021-10-07 – 2021-10-08 (×2): 100 mg via ORAL
  Filled 2021-10-07 (×2): qty 1

## 2021-10-07 MED ORDER — ONDANSETRON HCL 4 MG/2ML IJ SOLN
INTRAMUSCULAR | Status: AC
  Filled 2021-10-07: qty 2

## 2021-10-07 MED ORDER — ONDANSETRON HCL 4 MG PO TABS: 4.0000 mg | ORAL_TABLET | Freq: Four times a day (QID) | ORAL | Status: DC | PRN

## 2021-10-07 MED ORDER — BUPIVACAINE LIPOSOME 1.3 % IJ SUSP
20.0000 mL | Freq: Once | INTRAMUSCULAR | Status: DC
  Filled 2021-10-07: qty 20

## 2021-10-07 MED ORDER — FENTANYL CITRATE (PF) 100 MCG/2ML IJ SOLN
INTRAMUSCULAR | Status: AC
  Administered 2021-10-07: 50 ug via INTRAVENOUS
  Filled 2021-10-07: qty 2

## 2021-10-07 MED ORDER — INSULIN PEN NEEDLE 31G X 8 MM MISC: 1.0000 | Status: DC

## 2021-10-07 MED ORDER — ASPIRIN EC 325 MG PO TBEC: 325.0000 mg | DELAYED_RELEASE_TABLET | Freq: Every day | ORAL | 0 refills | Status: AC

## 2021-10-07 MED ORDER — DULOXETINE HCL 30 MG PO CPEP
30.0000 mg | ORAL_CAPSULE | Freq: Every day | ORAL | Status: DC
Start: 2021-10-07 — End: 2021-10-08
  Administered 2021-10-08: 30 mg via ORAL
  Filled 2021-10-07 (×2): qty 1

## 2021-10-07 MED ORDER — OXYCODONE HCL 5 MG PO TABS
5.0000 mg | ORAL_TABLET | ORAL | Status: DC | PRN
  Administered 2021-10-07 – 2021-10-08 (×3): 5 mg via ORAL
  Filled 2021-10-07 (×4): qty 1

## 2021-10-07 MED ORDER — EPHEDRINE SULFATE-NACL 50-0.9 MG/10ML-% IV SOSY
PREFILLED_SYRINGE | INTRAVENOUS | Status: DC | PRN
  Administered 2021-10-07 (×2): 5 mg via INTRAVENOUS

## 2021-10-07 MED ORDER — BUPIVACAINE LIPOSOME 1.3 % IJ SUSP
INTRAMUSCULAR | Status: AC
  Filled 2021-10-07: qty 20

## 2021-10-07 MED ORDER — ORAL CARE MOUTH RINSE: 15.0000 mL | Freq: Once | OROMUCOSAL | Status: AC

## 2021-10-07 MED ORDER — LACTATED RINGERS IV SOLN: INTRAVENOUS | Status: DC

## 2021-10-07 MED ORDER — ACETAMINOPHEN 500 MG PO TABS
1000.0000 mg | ORAL_TABLET | Freq: Once | ORAL | Status: AC
  Administered 2021-10-07: 1000 mg via ORAL
  Filled 2021-10-07: qty 2

## 2021-10-07 MED ORDER — ROPIVACAINE HCL 5 MG/ML IJ SOLN
INTRAMUSCULAR | Status: DC | PRN
  Administered 2021-10-07: 30 mL via PERINEURAL

## 2021-10-07 MED ORDER — PROPOFOL 500 MG/50ML IV EMUL
INTRAVENOUS | Status: DC | PRN
  Administered 2021-10-07: 75 ug/kg/min via INTRAVENOUS
  Administered 2021-10-07: 85 ug/kg/min via INTRAVENOUS

## 2021-10-07 MED ORDER — FENTANYL CITRATE (PF) 100 MCG/2ML IJ SOLN: 50.0000 ug | Freq: Once | INTRAMUSCULAR | Status: AC

## 2021-10-07 MED ORDER — 0.9 % SODIUM CHLORIDE (POUR BTL) OPTIME
TOPICAL | Status: DC | PRN
  Administered 2021-10-07: 1000 mL

## 2021-10-07 MED ORDER — HYDROMORPHONE HCL 1 MG/ML IJ SOLN: 0.5000 mg | INTRAMUSCULAR | Status: DC | PRN

## 2021-10-07 MED ORDER — MENTHOL 3 MG MT LOZG: 1.0000 | LOZENGE | OROMUCOSAL | Status: DC | PRN

## 2021-10-07 MED ORDER — ONDANSETRON HCL 4 MG/2ML IJ SOLN
INTRAMUSCULAR | Status: DC | PRN
  Administered 2021-10-07: 4 mg via INTRAVENOUS

## 2021-10-07 MED ORDER — BUPIVACAINE IN DEXTROSE 0.75-8.25 % IT SOLN
INTRATHECAL | Status: DC | PRN
  Administered 2021-10-07: 1.8 mL via INTRATHECAL

## 2021-10-07 MED ORDER — PROPOFOL 10 MG/ML IV BOLUS
INTRAVENOUS | Status: DC | PRN
  Administered 2021-10-07 (×2): 20 mg via INTRAVENOUS

## 2021-10-07 MED ORDER — LORATADINE 10 MG PO TABS
10.0000 mg | ORAL_TABLET | Freq: Every day | ORAL | Status: DC
  Administered 2021-10-08: 10 mg via ORAL
  Filled 2021-10-07 (×2): qty 1

## 2021-10-07 SURGICAL SUPPLY — 75 items
ATTUNE MED DOME PAT 41 KNEE (Knees) ×1 IMPLANT
ATTUNE PS FEM LT SZ 8 CEM KNEE (Femur) ×1 IMPLANT
ATTUNE PSRP INSR SZ8 5 KNEE (Insert) ×1 IMPLANT
BAG COUNTER SPONGE SURGICOUNT (BAG) ×2 IMPLANT
BAG SPNG CNTER NS LX DISP (BAG) ×1
BANDAGE ESMARK 6X9 LF (GAUZE/BANDAGES/DRESSINGS) ×1 IMPLANT
BASE TIBIA ATTUNE KNEE SYS SZ6 (Knees) IMPLANT
BLADE SAGITTAL 25.0X1.19X90 (BLADE) ×2 IMPLANT
BLADE SAW SGTL 13X75X1.27 (BLADE) ×2 IMPLANT
BNDG CMPR 9X6 STRL LF SNTH (GAUZE/BANDAGES/DRESSINGS) ×1
BNDG CMPR MED 10X6 ELC LF (GAUZE/BANDAGES/DRESSINGS) ×1
BNDG CMPR STD VLCR NS LF 5.8X4 (GAUZE/BANDAGES/DRESSINGS) ×1
BNDG ELASTIC 4X5.8 VLCR NS LF (GAUZE/BANDAGES/DRESSINGS) ×2 IMPLANT
BNDG ELASTIC 4X5.8 VLCR STR LF (GAUZE/BANDAGES/DRESSINGS) ×2 IMPLANT
BNDG ELASTIC 6X10 VLCR STRL LF (GAUZE/BANDAGES/DRESSINGS) ×2 IMPLANT
BNDG ESMARK 6X9 LF (GAUZE/BANDAGES/DRESSINGS) ×2
BOWL SMART MIX CTS (DISPOSABLE) ×2 IMPLANT
BSPLAT TIB 6 CMNT ROT PLAT STR (Knees) ×1 IMPLANT
CEMENT HV SMART SET (Cement) ×4 IMPLANT
COVER SURGICAL LIGHT HANDLE (MISCELLANEOUS) ×2 IMPLANT
CUFF TOURN SGL QUICK 34 (TOURNIQUET CUFF) ×2
CUFF TOURN SGL QUICK 42 (TOURNIQUET CUFF) IMPLANT
CUFF TRNQT CYL 34X4.125X (TOURNIQUET CUFF) ×1 IMPLANT
DRAPE ORTHO SPLIT 77X108 STRL (DRAPES) ×4
DRAPE SURG ORHT 6 SPLT 77X108 (DRAPES) ×2 IMPLANT
DRAPE U-SHAPE 47X51 STRL (DRAPES) ×2 IMPLANT
DRSG MEPILEX BORDER 4X12 (GAUZE/BANDAGES/DRESSINGS) ×1 IMPLANT
DRSG PAD ABDOMINAL 8X10 ST (GAUZE/BANDAGES/DRESSINGS) ×2 IMPLANT
DURAPREP 26ML APPLICATOR (WOUND CARE) ×4 IMPLANT
ELECT REM PT RETURN 9FT ADLT (ELECTROSURGICAL) ×2
ELECTRODE REM PT RTRN 9FT ADLT (ELECTROSURGICAL) ×1 IMPLANT
FACESHIELD WRAPAROUND (MASK) ×4 IMPLANT
FACESHIELD WRAPAROUND OR TEAM (MASK) ×2 IMPLANT
GAUZE XEROFORM 5X9 LF (GAUZE/BANDAGES/DRESSINGS) ×1 IMPLANT
GLOVE BIOGEL PI IND STRL 8 (GLOVE) ×2 IMPLANT
GLOVE BIOGEL PI INDICATOR 8 (GLOVE) ×2
GLOVE ORTHO TXT STRL SZ7.5 (GLOVE) ×4 IMPLANT
GOWN STRL REUS W/ TWL LRG LVL3 (GOWN DISPOSABLE) ×1 IMPLANT
GOWN STRL REUS W/ TWL XL LVL3 (GOWN DISPOSABLE) ×1 IMPLANT
GOWN STRL REUS W/TWL 2XL LVL3 (GOWN DISPOSABLE) ×2 IMPLANT
GOWN STRL REUS W/TWL LRG LVL3 (GOWN DISPOSABLE) ×2
GOWN STRL REUS W/TWL XL LVL3 (GOWN DISPOSABLE) ×2
HANDPIECE INTERPULSE COAX TIP (DISPOSABLE)
IMMOBILIZER KNEE 22 UNIV (SOFTGOODS) ×2 IMPLANT
KIT BASIN OR (CUSTOM PROCEDURE TRAY) ×2 IMPLANT
KIT TURNOVER KIT B (KITS) ×2 IMPLANT
MANIFOLD NEPTUNE II (INSTRUMENTS) ×2 IMPLANT
MARKER SKIN DUAL TIP RULER LAB (MISCELLANEOUS) ×2 IMPLANT
NDL 18GX1X1/2 (RX/OR ONLY) (NEEDLE) ×1 IMPLANT
NDL HYPO 25GX1X1/2 BEV (NEEDLE) ×1 IMPLANT
NEEDLE 18GX1X1/2 (RX/OR ONLY) (NEEDLE) ×2 IMPLANT
NEEDLE HYPO 25GX1X1/2 BEV (NEEDLE) ×2 IMPLANT
NS IRRIG 1000ML POUR BTL (IV SOLUTION) ×2 IMPLANT
PACK TOTAL JOINT (CUSTOM PROCEDURE TRAY) ×2 IMPLANT
PAD ARMBOARD 7.5X6 YLW CONV (MISCELLANEOUS) ×4 IMPLANT
PADDING CAST COTTON 6X4 STRL (CAST SUPPLIES) ×2 IMPLANT
PULSAVAC PLUS IRRIG FAN TIP (DISPOSABLE) ×2
SET HNDPC FAN SPRY TIP SCT (DISPOSABLE) ×1 IMPLANT
SPONGE T-LAP 18X18 ~~LOC~~+RFID (SPONGE) ×2 IMPLANT
STAPLER VISISTAT 35W (STAPLE) IMPLANT
SUCTION FRAZIER HANDLE 10FR (MISCELLANEOUS) ×2
SUCTION TUBE FRAZIER 10FR DISP (MISCELLANEOUS) ×1 IMPLANT
SUT VIC AB 0 CT1 27 (SUTURE) ×2
SUT VIC AB 0 CT1 27XBRD ANBCTR (SUTURE) ×1 IMPLANT
SUT VIC AB 1 CTX 36 (SUTURE) ×4
SUT VIC AB 1 CTX36XBRD ANBCTR (SUTURE) ×2 IMPLANT
SUT VIC AB 2-0 CT1 27 (SUTURE) ×4
SUT VIC AB 2-0 CT1 TAPERPNT 27 (SUTURE) ×2 IMPLANT
SYR 50ML LL SCALE MARK (SYRINGE) ×2 IMPLANT
SYR CONTROL 10ML LL (SYRINGE) ×2 IMPLANT
TIBIA ATTUNE KNEE SYS BASE SZ6 (Knees) ×2 IMPLANT
TIP FAN IRRIG PULSAVAC PLUS (DISPOSABLE) IMPLANT
TOWEL GREEN STERILE (TOWEL DISPOSABLE) ×2 IMPLANT
TOWEL GREEN STERILE FF (TOWEL DISPOSABLE) ×2 IMPLANT
TRAY CATH 16FR W/PLASTIC CATH (SET/KITS/TRAYS/PACK) IMPLANT

## 2021-10-07 NOTE — Anesthesia Postprocedure Evaluation (Signed)
Anesthesia Post Note ? ?Patient: [name] ? ?Procedure(s) Performed: LEFT TOTAL KNEE ARTHROPLASTY (Left: Knee) ? ?  ? ?Patient location during evaluation: PACU ?Anesthesia Type: Spinal ?Level of consciousness: awake, awake and alert and oriented ?Pain management: pain level controlled ?Vital Signs Assessment: post-procedure vital signs reviewed and stable ?Respiratory status: spontaneous breathing, nonlabored ventilation and respiratory function stable ?Cardiovascular status: blood pressure returned to baseline and stable ?Postop Assessment: no headache, no backache, spinal receding and no apparent nausea or vomiting ?Anesthetic complications: no ? ? ?No notable events documented. ? ?Last Vitals:  ?Vitals:  ? 10/07/21 1625 10/07/21 1707  ?BP: 134/72 137/71  ?Pulse: (!) 57 (!) 57  ?Resp: 12 16  ?Temp: 36.7 ?C 36.8 ?C  ?SpO2: 93% 96%  ?  ?Last Pain:  ?Vitals:  ? 10/07/21 1707  ?TempSrc: Oral  ?PainSc: 0-No pain  ? ? ?  ?  ?  ?  ?  ?  ? ?Santa Lighter ? ? ? ? ?

## 2021-10-07 NOTE — Op Note (Signed)
Preop diagnosis: Left knee primary osteoarthritis ? ?Postop diagnosis: Same ? ?Procedure: Left total knee arthroplasty. ? ?Surgeon: Rodell Perna, MD ? ?Assistant: Benjiman Core, PA-C medically necessary and present for the entire procedure ? ?Anesthesia: Preoperative abductor block plus spinal anesthesia plus Exparel and Marcaine 20+20. ? ?Tourniquet time: 300 x 65 minutes. ? ?Implants:Implants ? ?CEMENT HV SMART SET - [id number] ? ?Inventory Item: CEMENT HV SMART SET Serial no.:  Model/Cat no.: [id number]  ?Implant name: CEMENT HV SMART SET - [id number] Laterality: Left Area: Knee  ?Manufacturer: Dewitt Hoes Date of Manufacture:    ?Action: Implanted Number Used: 2   ?Device Identifier:  Device Identifier Type:    ? ?TIBIA ATTUNE KNEE SYS BASE SZ6 - [id number] ? ?Inventory Item: TIBIA ATTUNE KNEE SYS BASE SZ6 Serial no.:  Model/Cat no.: [id number]  ?Implant name: TIBIA ATTUNE KNEE SYS BASE SZ6 - [id number] Laterality: Left Area: Knee  ?Manufacturer: Dewitt Hoes Date of Manufacture:    ?Action: Implanted Number Used: 1   ?Device Identifier:  Device Identifier Type:    ? ?ATTUNE MED DOME PAT 41MM KNEE - [id number] ? ?Inventory Item: ATTUNE MED DOME PAT 41MM KNEE Serial no.:  Model/Cat no.: [id number]  ?Implant name: ATTUNE MED DOME PAT 41MM KNEE - [id number] Laterality: Left Area: Knee  ?Manufacturer: Dewitt Hoes Date of Manufacture:    ?Action: Implanted Number Used: 1   ?Device Identifier:  Device Identifier Type:    ? ?ATTUNE PSRP INSR SZ8 5MM KNEE - [id number] ? ?Inventory Item: ATTUNE PSRP INSR SZ8 5MM KNEE Serial no.:  Model/Cat no.: [id number]  ?Implant name: ATTUNE PSRP INSR SZ8 5MM KNEE - [id number] Laterality: Left Area: Knee  ?Manufacturer: Dewitt Hoes Date of Manufacture:    ?Action: Implanted Number Used: 1   ?Device Identifier:  Device Identifier Type:    ? ?ATTUNE PS FEM LT SZ 8 CEM KNEE - [id number] ? ?Inventory Item: ATTUNE PS FEM LT SZ 8 CEM KNEE Serial no.:  Model/Cat no.: [id number]   ?Implant name: ATTUNE PS FEM LT SZ 8 CEM KNEE - [id number] Laterality: Left Area: Knee  ?Manufacturer: Dewitt Hoes Date of Manufacture:    ?Action: Implanted Number Used: 1   ?Device Identifier:  Device Identifier Type:    ?Output appreciated procedure: After standard prepping and draping after spinal anesthesia lateral post heel bump proximal thigh tourniquet prepped with DuraPrep tiptoes preoperative Ancef IV TXA usual total knee sheets drapes sterile skin marker Betadine Steri-Drape impervious stockinette Coban were applied.  Timeout procedure was completed.  Leg was wrapped in Esmarch tourniquet inflated.  Midline incision was made.  Medial parapatellar incision patella was everted 10 mm resected.  Intramedullary hole drilled the femur it was size 4 a size 8 femur.  Patient's opposite knee was a size 7 however trial was placed up against 7 8 was appropriate size.  Distal cut was made taking 10 mm.  Initial cut on the tibia of 9 we came back after trialing and had to take an additional 4 off the tibia with good flexion-extension balance.  There is tricompartmental degenerative changes in the knee marginal osteophytes on the femur which were resected.  There was mucoid degeneration of the ACL and PCL. ? ?Chamfer cuts of been made on the femur box cut.  Trial sizers had good fit.  Size 6 on the tibia.  Pulsatile lavage vacuum mixing of the cement.  Pedicles been drilled in the patella as well as femur.  Pulse lavage tibia cemented first followed by cementing the femur placing  the permanent 5 mm poly with good flexion extension balance.  3 peg patella was held patella clamp.  Cement was hardened 15 minutes.  Exparel Marcaine was injected tourniquet deflated hemostasis obtained and then standard layered closure #1 Vicryl.  2-0 Vicryl and subcutaneous tissue.  Skin staple closure postop dressing and knee immobilizer. ?

## 2021-10-07 NOTE — Interval H&P Note (Signed)
History and Physical Interval Note: ? ?10/07/2021 ?12:02 PM ? ?[name]  has presented today for surgery, with the diagnosis of left knee osteoarthritis.  The various methods of treatment have been discussed with the patient and family. After consideration of risks, benefits and other options for treatment, the patient has consented to  Procedure(s): ?LEFT TOTAL KNEE ARTHROPLASTY (Left) as a surgical intervention.  The patient's history has been reviewed, patient examined, no change in status, stable for surgery.  I have reviewed the patient's chart and labs.  Questions were answered to the patient's satisfaction.   ? ? ?Johnny Huber ? ? ?

## 2021-10-07 NOTE — Progress Notes (Signed)
Orthopedic Tech Progress Note ?Patient Details:  ?Johnny Huber ?[birth year] ?[id number] ? ?CPM Left Knee ?CPM Left Knee: Off ?Left Knee Flexion (Degrees): 0 ?Left Knee Extension (Degrees): 70 ?Additional Comments: fresh ice ?The patient refused the knee immobilizer being applied. Patient said he had to be able to bend his knee went he wanted. I left the immobilizer in the room and told the patient to talk to the dr about it in the morning. ?Post Interventions ?Patient Tolerated: Well ?Instructions Provided: Care of device ? ?Karolee Stamps ?10/07/2021, 8:56 PM ? ?

## 2021-10-07 NOTE — Anesthesia Procedure Notes (Signed)
Spinal ? ?Patient location during procedure: OR ?Start time: 10/07/2021 1:00 PM ?End time: 10/07/2021 1:05 PM ?Reason for block: surgical anesthesia ?Staffing ?Performed: anesthesiologist  ?Anesthesiologist: Merlinda Makenzie, MD ?Preanesthetic Checklist ?Completed: patient identified, IV checked, risks and benefits discussed, surgical consent, monitors and equipment checked, pre-op evaluation and timeout performed ?Spinal Block ?Patient position: sitting ?Prep: DuraPrep ?Patient monitoring: cardiac monitor, continuous pulse ox and blood pressure ?Approach: midline ?Location: L3-4 ?Injection technique: single-shot ?Needle ?Needle type: Pencan  ?Needle gauge: 24 G ?Needle length: 9 cm ?Assessment ?Events: CSF return ?Additional Notes ?Functioning IV was confirmed and monitors were applied. Sterile prep and drape, including hand hygiene and sterile gloves were used. The patient was positioned and the spine was prepped. The skin was anesthetized with lidocaine.  Free flow of clear CSF was obtained prior to injecting local anesthetic into the CSF.  The spinal needle aspirated freely following injection.  The needle was carefully withdrawn.  The patient tolerated the procedure well.  ? ? ? ?

## 2021-10-07 NOTE — Transfer of Care (Signed)
Immediate Anesthesia Transfer of Care Note ? ?Patient: Johnny Huber ? ?Procedure(s) Performed: LEFT TOTAL KNEE ARTHROPLASTY (Left: Knee) ? ?Patient Location: PACU ? ?Anesthesia Type:Regional and Spinal ? ?Level of Consciousness: drowsy ? ?Airway & Oxygen Therapy: Patient Spontanous Breathing and Patient connected to face mask oxygen ? ?Post-op Assessment: Report given to RN and Post -op Vital signs reviewed and stable ? ?Post vital signs: Reviewed and stable ? ?Last Vitals:  ?Vitals Value Taken Time  ?BP 109/67 10/07/21 1523  ?Temp    ?Pulse 62 10/07/21 1523  ?Resp 17 10/07/21 1523  ?SpO2 96 % 10/07/21 1523  ?Vitals shown include unvalidated device data. ? ?Last Pain:  ?Vitals:  ? 10/07/21 1059  ?TempSrc:   ?PainSc: 0-No pain  ?   ? ?  ? ?Complications: No notable events documented. ?

## 2021-10-07 NOTE — H&P (Signed)
TOTAL KNEE ADMISSION H&P ? ?Patient is being admitted for left total knee arthroplasty. ? ?Subjective: ? ?Chief Complaint:left knee pain. ? ?HPI: Johnny Huber, 76 y.o. male, has a history of pain and functional disability in the left knee due to trauma and has failed non-surgical conservative treatments for greater than 12 weeks to includeNSAID's and/or analgesics, corticosteriod injections, use of assistive devices, and activity modification.  Onset of symptoms was gradual, starting 10 years ago with gradually worsening course since that time. The patient noted no past surgery on the left knee(s).  Patient currently rates pain in the left knee(s) at 10 out of 10 with activity. Patient has night pain, worsening of pain with activity and weight bearing, pain that interferes with activities of daily living, pain with passive range of motion, crepitus, and joint swelling.  Patient has evidence of subchondral cysts, subchondral sclerosis, periarticular osteophytes, and joint space narrowing by imaging studies. . There is no active infection. ? ?Patient Active Problem List  ? Diagnosis Date Noted  ? Unilateral primary osteoarthritis, left knee 06/13/2021  ? Spondylosis without myelopathy or radiculopathy, cervical region 04/07/2019  ? Hx of total knee arthroplasty, right 04/07/2019  ? Chronic right-sided low back pain with right-sided sciatica 01/01/2017  ? Hypercholesteremia 03/11/2016  ? Class 1 obesity due to excess calories with serious comorbidity and body mass index (BMI) of 34.0 to 34.9 in adult 03/11/2016  ? ANEMIA 12/31/2009  ? RECTAL BLEEDING 12/31/2009  ? DM type 2 causing vascular disease ([place]) 04/12/2007  ? KNEE PAIN 04/12/2007  ? Essential hypertension, benign 04/12/2007  ? ?Past Medical History:  ?Diagnosis Date  ? Asthmatic bronchitis   ? as a child  ? Coronary atherosclerosis of native coronary artery   ? BMS to RCA 2015  ? DDD (degenerative disc disease), lumbar   ? Diabetes ([place])   ?  Gastroesophageal reflux disease   ? Hypertension   ? Insomnia, unspecified   ? Obstructive sleep apnea   ? uses cpap  ? Pneumonia   ? Prostate cancer ([place])   ? had prostate removed  ? Restless legs syndrome   ? Rhinitis, allergic   ?  ?Past Surgical History:  ?Procedure Laterality Date  ? CARDIAC CATHETERIZATION  2015  ? CORONARY ANGIOPLASTY    ? OTHER SURGICAL HISTORY  1983  ? R KNEE ARTHORSCOPY x 3  ? PROSTATECTOMY  2011  ? TONSILLECTOMY    ? TOTAL KNEE ARTHROPLASTY Right 12/09/2017  ? CEMENTED   ? TOTAL KNEE ARTHROPLASTY Right 12/09/2017  ? Procedure: RIGHT TOTAL KNEE ARTHROPLASTY-CEMENTED;  Surgeon: [name], MD;  Location: [place];  Service: Orthopedics;  Laterality: Right;  ?  ?Current Facility-Administered Medications  ?Medication Dose Route Frequency Provider Last Rate Last Admin  ? acetaminophen (TYLENOL) tablet 1,000 mg  1,000 mg Oral Once Santa Lighter, MD      ? bupivacaine liposome (EXPAREL) 1.3 % injection 266 mg  20 mL Infiltration Once Lanae Crumbly, PA-C      ? ceFAZolin (ANCEF) IVPB 2g/100 mL premix  2 g Intravenous On Call to OR Lanae Crumbly, PA-C      ? chlorhexidine (PERIDEX) 0.12 % solution 15 mL  15 mL Mouth/Throat Once Annye Asa, MD      ? Or  ? MEDLINE mouth rinse  15 mL Mouth Rinse Once Annye Asa, MD      ? lactated ringers infusion   Intravenous Continuous Annye Asa, MD      ? ?No  Known Allergies  ?Social History  ? ?Tobacco Use  ? Smoking status: Former  ?  Types: Cigarettes  ?  Quit date: 09/01/1982  ?  Years since quitting: 39.1  ? Smokeless tobacco: Never  ?Substance Use Topics  ? Alcohol use: No  ?  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  ?Family History  ?Problem Relation Age of Onset  ? Cancer Sister   ?  ? ?Review of Systems  ?Constitutional:  Positive for activity change.  ?HENT: Negative.    ?Respiratory: Negative.    ?Cardiovascular: Negative.   ?Genitourinary: Negative.   ?Musculoskeletal:  Positive for gait problem  and joint swelling.  ?Psychiatric/Behavioral: Negative.    ? ?Objective: ? ?Physical Exam ?HENT:  ?   Head: Normocephalic.  ?   Nose: Nose normal.  ?Eyes:  ?   Extraocular Movements: Extraocular movements intact.  ?Cardiovascular:  ?   Heart sounds: Normal heart sounds.  ?Pulmonary:  ?   Effort: No respiratory distress.  ?Abdominal:  ?   General: There is no distension.  ?Neurological:  ?   Mental Status: He is alert.  ?Psychiatric:     ?   Mood and Affect: Mood normal.  ? ? ?Vital signs in last 24 hours: ?Temp:  [97.5 ?F (36.4 ?C)] 97.5 ?F (36.4 ?C) (05/15 1039) ?Pulse Rate:  [65] 65 (05/15 1039) ?Resp:  [18] 18 (05/15 1039) ?BP: (151)/(76) 151/76 (05/15 1039) ?SpO2:  [96 %] 96 % (05/15 1039) ?Weight:  [96.6 kg] 96.6 kg (05/15 1039) ? ?Labs: ? ? ?Estimated body mass index is 29.71 kg/m? as calculated from the following: ?  Height as of this encounter: '5\' 11"'$  (1.803 m). ?  Weight as of this encounter: 96.6 kg. ? ? ?Imaging Review ?Plain radiographs demonstrate moderate degenerative joint disease of the left knee(s). The overall alignment ismild varus. The bone quality appears to be good for age and reported activity level. ? ? ? ? ? ?Assessment/Plan: ? ?End stage arthritis, left knee  ? ?The patient history, physical examination, clinical judgment of the provider and imaging studies are consistent with end stage degenerative joint disease of the left knee(s) and total knee arthroplasty is deemed medically necessary. The treatment options including medical management, injection therapy arthroscopy and arthroplasty were discussed at length. The risks and benefits of total knee arthroplasty were presented and reviewed. The risks due to aseptic loosening, infection, stiffness, patella tracking problems, thromboembolic complications and other imponderables were discussed. The patient acknowledged the explanation, agreed to proceed with the plan and consent was signed. Patient is being admitted for inpatient treatment for  surgery, pain control, PT, OT, prophylactic antibiotics, VTE prophylaxis, progressive ambulation and ADL's and discharge planning. The patient is planning to be discharged home with home health services ? ? ? ? ?Patient's anticipated LOS is less than 2 midnights, meeting these requirements: ?- Younger than 56 ?- Lives within 1 hour of care ?- Has a competent adult at home to recover with post-op recover ?- NO history of ? - Chronic pain requiring opiods ? - Diabetes ? - Coronary Artery Disease ? - Heart failure ? - Heart attack ? - Stroke ? - DVT/VTE ? - Cardiac arrhythmia ? - Respiratory Failure/COPD ? - Renal failure ? - Anemia ? - Advanced Liver disease ? ? ?

## 2021-10-07 NOTE — Anesthesia Procedure Notes (Addendum)
Anesthesia Regional Block: Adductor canal block  ? ?Pre-Anesthetic Checklist: , timeout performed,  Correct Patient, Correct Site, Correct Laterality,  Correct Procedure, Correct Position, site marked,  Risks and benefits discussed,  Surgical consent,  Pre-op evaluation,  At surgeon's request and post-op pain management ? ?Laterality: Left ? ?Prep: chloraprep     ?  ?Needles:  ?Injection technique: Single-shot ? ?Needle Type: Echogenic Needle   ? ? ?Needle Length: 9cm  ?Needle Gauge: 21  ? ? ? ?Additional Needles: ? ? ?Procedures:,,,, ultrasound used (permanent image in chart),,    ?Narrative:  ?Start time: 10/07/2021 11:35 AM ?End time: 10/07/2021 11:45 AM ?Injection made incrementally with aspirations every 5 mL. ? ?Performed by: Personally  ?Anesthesiologist: Santa Lighter, MD ? ?Additional Notes: ?No pain on injection. No increased resistance to injection. Injection made in 5cc increments.  Good needle visualization.  Patient tolerated procedure well. ? ? ? ? ?

## 2021-10-07 NOTE — Progress Notes (Signed)
Orthopedic Tech Progress Note ?Patient Details:  ?STRYKER VEASEY ?1945/11/14 ?[id number] ? ?After helping PACU RN change patient I applied the CPM ? ?CPM Left Knee ?CPM Left Knee: On ?Left Knee Flexion (Degrees): 0 ?Left Knee Extension (Degrees): 70 ?Additional Comments: fresh ice ? ?Post Interventions ?Patient Tolerated: Well ?Instructions Provided: Care of device ? ?Janit Pagan ?10/07/2021, 5:15 PM ? ?

## 2021-10-07 NOTE — Interval H&P Note (Signed)
History and Physical Interval Note: ? ?10/07/2021 ?12:02 PM ? ?Johnny Huber  has presented today for surgery, with the diagnosis of left knee osteoarthritis.  The various methods of treatment have been discussed with the patient and family. After consideration of risks, benefits and other options for treatment, the patient has consented to  Procedure(s): ?LEFT TOTAL KNEE ARTHROPLASTY (Left) as a surgical intervention.  The patient's history has been reviewed, patient examined, no change in status, stable for surgery.  I have reviewed the patient's chart and labs.  Questions were answered to the patient's satisfaction.   ? ? ?[name] ? ? ?

## 2021-10-08 ENCOUNTER — Encounter (HOSPITAL_COMMUNITY): Payer: Self-pay | Admitting: Orthopaedic Surgery

## 2021-10-08 DIAGNOSIS — M1712 Unilateral primary osteoarthritis, left knee: Secondary | ICD-10-CM | POA: Diagnosis not present

## 2021-10-08 LAB — BASIC METABOLIC PANEL
Anion gap: 8 (ref 5–15)
BUN: 17 mg/dL (ref 8–23)
CO2: 23 mmol/L (ref 22–32)
Calcium: 8.4 mg/dL — ABNORMAL LOW (ref 8.9–10.3)
Chloride: 104 mmol/L (ref 98–111)
Creatinine, Ser: 0.99 mg/dL (ref 0.61–1.24)
GFR, Estimated: >60 mL/min (ref >60)
Glucose, Bld: 203 mg/dL — ABNORMAL HIGH (ref 70–99)
Potassium: 4.3 mmol/L (ref 3.5–5.1)
Sodium: 135 mmol/L (ref 135–145)

## 2021-10-08 LAB — CBC
HCT: 35.6 % — ABNORMAL LOW (ref 39.0–52.0)
Hemoglobin: 11.9 g/dL — ABNORMAL LOW (ref 13.0–17.0)
MCH: 32.9 pg (ref 26.0–34.0)
MCHC: 33.4 g/dL (ref 30.0–36.0)
MCV: 98.3 fL (ref 80.0–100.0)
Platelets: 166 10*3/uL (ref 150–400)
RBC: 3.62 MIL/uL — ABNORMAL LOW (ref 4.22–5.81)
RDW: 11.3 % — ABNORMAL LOW (ref 11.5–15.5)
WBC: 13.2 10*3/uL — ABNORMAL HIGH (ref 4.0–10.5)
nRBC: 0 % (ref 0.0–0.2)

## 2021-10-08 LAB — GLUCOSE, CAPILLARY: Glucose-Capillary: 260 mg/dL — ABNORMAL HIGH (ref 70–99)

## 2021-10-08 NOTE — TOC Transition Note (Signed)
Transition of Care (TOC) - CM/SW Discharge Note ?Marvetta Gibbons Therapist, sports, BSN ?Transitions of Care ?Unit 4E- RN Case Manager ?See Treatment Team for direct phone #  ?Cross Coverage 5N ? ?Patient Details  ?Name: Johnny Huber ?MRN: [id number] ?Date of Birth: 03/06/1946 ? ?Transition of Care (TOC) CM/SW Contact:  ?Dahlia Client, Romeo Rabon, RN ?Phone Number: ?10/08/2021, 1:58 PM ? ? ?Clinical Narrative:    ?Pt s/p TKA, from home. Order placed for HHPT.  ?CM in to see pt at bedside, grandson also present. Discussed HH and DME, per pt he has all needed DME at home from previous knee surgery. No new DME needs noted.  ?List provided to pt for [facility] choice Per CMS guidelines from medicare.gov website with star ratings (copy placed in shadow chart), pt reports he used [facility] on his last knee surgery 4 years ago but does not remember name. After review of list with grandson, pt has decided on [facility] as first choice.  ? ?Address, phone #s and PCP all confirmed in epic, grandson to transport home.  ? ?Call made to [facility] with [facility] for [place] referral - referral has been accepted and they will contact pt to schedule for start of care.  ? ? ?Final next level of care: [place] ?Barriers to Discharge: No Barriers Identified ? ? ?Patient Goals and CMS Choice ?Patient states their goals for this hospitalization and ongoing recovery are:: return home ?CMS Medicare.gov Compare Post Acute Care list provided to:: Patient ?Choice offered to / list presented to : Patient ? ?Discharge Placement ?  ?           ? Home w/ HH ?  ?  ?  ? ?Discharge Plan and Services ?  ?Discharge Planning Services: CM Consult ?Post Acute Care Choice: Home Health          ?DME Arranged: N/A ?DME Agency: NA ?  ?  ?  ?HH Arranged: PT ?[place] Agency: [place] ?Date HH Agency Contacted: 10/08/21 ?Time [place]: 2482 ?Representative spoke with at [place]: Tommi Rumps ? ?Social Determinants of Health (SDOH) Interventions ?  ? ? ?Readmission Risk  Interventions ?   ? View : No data to display.  ?  ?  ?  ? ? ? ? ? ?

## 2021-10-08 NOTE — Progress Notes (Signed)
Knee immobilizer applied to left knee.  

## 2021-10-08 NOTE — Evaluation (Addendum)
Physical Therapy Evaluation ?Patient Details ?Name: Johnny Huber ?MRN: [id number] ?DOB: December 24, 1945 ?Today's Date: 10/08/2021 ? ?History of Present Illness ? Pt is 76 yo M s/p L TKA on 10/07/21. PMH: DM, HTN, CAD  ?Clinical Impression ? Pt  was previously independent with all ADLs and IADLs with no AD. Pt currently requiring min guard for transfers. Able to ambulate 434f modI with RW. Pt reports little to no pain with activity. Pt would benefit from acute PT to address strength and mobility deficits. Pt would benefit from additional therapy upon discharge to continue to address deficits.  ?   ? ?Recommendations for follow up therapy are one component of a multi-disciplinary discharge planning process, led by the attending physician.  Recommendations may be updated based on patient status, additional functional criteria and insurance authorization. ? ?Follow Up Recommendations Follow physician's recommendations for discharge plan and follow up therapies ? ?  ?Assistance Recommended at Discharge Intermittent Supervision/Assistance  ?Patient can return home with the following ? Assistance with cooking/housework;A little help with bathing/dressing/bathroom;Assist for transportation;Help with stairs or ramp for entrance ? ?  ?Equipment Recommendations None recommended by PT  ?Recommendations for Other Services ?    ?  ?Functional Status Assessment Patient has had a recent decline in their functional status and demonstrates the ability to make significant improvements in function in a reasonable and predictable amount of time.  ? ?  ?Precautions / Restrictions Precautions ?Precautions: Knee;Fall ?Required Braces or Orthoses: Knee Immobilizer - Left ?Knee Immobilizer - Left: On at all times;Other (comment) (except when doing ther ex) ?Restrictions ?Weight Bearing Restrictions: Yes ?LLE Weight Bearing: Weight bearing as tolerated  ? ?  ? ?Mobility ? Bed Mobility ?Overal bed mobility: Independent ?  ?  ?  ?  ?  ?  ?  ?   ? ?Transfers ?Overall transfer level: Modified independent ?Equipment used: Rolling walker (2 wheels) ?  ?  ?  ?  ?  ?  ?  ?  ?  ? ?Ambulation/Gait ?Ambulation/Gait assistance: Modified independent (Device/Increase time) ?Gait Distance (Feet): 400 Feet ?Assistive device: Rolling walker (2 wheels) ?Gait Pattern/deviations: Step-through pattern, Decreased weight shift to left, Decreased stance time - left ?Gait velocity: decreased ?  ?  ?General Gait Details: Verbal cues for pointing L toes forward ? ?Stairs ?  ?  ?  ?  ?  ? ?Wheelchair Mobility ?  ? ?Modified Rankin (Stroke Patients Only) ?  ? ?  ? ?Balance Overall balance assessment: Modified Independent ?  ?  ?  ?  ?  ?  ?  ?  ?  ?  ?  ?  ?  ?  ?  ?  ?  ?  ?   ? ? ? ?Pertinent Vitals/Pain Pain Assessment ?Pain Assessment: Faces ?Faces Pain Scale: Hurts a little bit ?Pain Location: L knee ?Pain Descriptors / Indicators: Grimacing, Guarding ?Pain Intervention(s): Monitored during session  ? ? ?Home Living Family/patient expects to be discharged to:: Private residence ?Living Arrangements: Spouse/significant other;Children;Other relatives ?Available Help at Discharge: Family ?Type of Home: House ?Home Access: Ramped entrance ?  ?  ?  ?Home Layout: Able to live on main level with bedroom/bathroom ?Home Equipment: RConservation officer, nature(2 wheels);Cane - quad;Wheelchair - manual ?   ?  ?Prior Function Prior Level of Function : Independent/Modified Independent ?  ?  ?  ?  ?  ?  ?  ?  ?  ? ? ?Hand Dominance  ?   ? ?  ?Extremity/Trunk Assessment  ?  Upper Extremity Assessment ?Upper Extremity Assessment: Overall WFL for tasks assessed ?  ? ?Lower Extremity Assessment ?Lower Extremity Assessment: LLE deficits/detail ?LLE Deficits / Details: Expected post op deficits, did not formally assess, at least 3/5 strength; able to perform SLR, quad set, LAQ. ?  ? ?Cervical / Trunk Assessment ?Cervical / Trunk Assessment: Normal  ?Communication  ? Communication: No difficulties  ?Cognition  Arousal/Alertness: Awake/alert ?Behavior During Therapy: [facility] for tasks assessed/performed ?Overall Cognitive Status: Within Functional Limits for tasks assessed ?  ?  ?  ?  ?  ?  ?  ?  ?  ?  ?  ?  ?  ?  ?  ?  ?  ?  ?  ? ?  ?General Comments   ? ?  ?Exercises Total Joint Exercises ?Quad Sets: Left, 10 reps ?Heel Slides: Left, 10 reps ?Straight Leg Raises: Left, 10 reps ?Goniometric ROM: 10-95  ? ?Assessment/Plan  ?  ?PT Assessment Patient needs continued PT services  ?PT Problem List Decreased strength;Decreased range of motion;Decreased activity tolerance;Decreased mobility;Pain ? ?   ?  ?PT Treatment Interventions DME instruction;Gait training;Stair training;Functional mobility training;Therapeutic activities;Therapeutic exercise;Balance training;Patient/family education   ? ?PT Goals (Current goals can be found in the Care Plan section)  ?Acute Rehab PT Goals ?Patient Stated Goal: to return home ?PT Goal Formulation: With patient ?Time For Goal Achievement: 10/22/21 ?Potential to Achieve Goals: Good ? ?  ?Frequency 7X/week ?  ? ? ?Co-evaluation   ?  ?  ?  ?  ? ? ?  ?AM-PAC PT "6 Clicks" Mobility  ?Outcome Measure Help needed turning from your back to your side while in a flat bed without using bedrails?: None ?Help needed moving from lying on your back to sitting on the side of a flat bed without using bedrails?: None ?Help needed moving to and from a bed to a chair (including a wheelchair)?: None ?Help needed standing up from a chair using your arms (e.g., wheelchair or bedside chair)?: None ?Help needed to walk in hospital room?: None ?Help needed climbing 3-5 steps with a railing? : A Little ?6 Click Score: 23 ? ?  ?End of Session Equipment Utilized During Treatment: Gait belt ?Activity Tolerance: Patient tolerated treatment well ?Patient left: with call bell/phone within reach;in chair ?Nurse Communication: Mobility status ?PT Visit Diagnosis: Difficulty in walking, not elsewhere classified (R26.2) ?  ? ?Time:  6237-6283 ?PT Time Calculation (min) (ACUTE ONLY): 32 min ? ? ?Charges:   PT Evaluation ?$PT Eval Low Complexity: 1 Low ?PT Treatments ?$Gait Training: 8-22 mins ?  ?   ? ? ?Mackie Pai, SPT ?Acute Rehabilitation Services ? ?Mackie Pai ?10/08/2021, 12:26 PM ? ?

## 2021-10-08 NOTE — Discharge Instructions (Signed)

## 2021-10-08 NOTE — Progress Notes (Signed)
Patient ID: Johnny Huber, male   DOB: Jun 04, 1945, 76 y.o.   MRN: [id number] ?Walked 400 ft , pain controlled. Has ramp at home , office one week . Dressing change by RN and discharge Home. HHPT needed.  ?

## 2021-10-08 NOTE — Plan of Care (Signed)

## 2021-10-08 NOTE — Progress Notes (Signed)
AVS provided and reviewed with pt. All questions answered at this time.  ?

## 2021-10-08 NOTE — Progress Notes (Signed)
Inpatient Diabetes Program Recommendations ? ?AACE/ADA: New Consensus Statement on Inpatient Glycemic Control (2015) ? ?Target Ranges:  Prepandial:   less than 140 mg/dL ?     Peak postprandial:   less than 180 mg/dL (1-2 hours) ?     Critically ill patients:  140 - 180 mg/dL  ? ?Lab Results  ?Component Value Date  ? GLUCAP 260 (H) 10/08/2021  ? HGBA1C 8.3 (H) 09/23/2016  ? ? ?Review of Glycemic Control ? Latest Reference Range & Units 10/07/21 15:25 10/07/21 17:56 10/07/21 21:13 10/08/21 09:07  ?Glucose-Capillary 70 - 99 mg/dL 137 (H) 168 (H) 288 (H) 260 (H)  ? ?Diabetes history: DM 2 ?Outpatient Diabetes medications: Lantus 28 units ?Current orders for Inpatient glycemic control:  ?Semglee 28 units ? ?A1c 8.3% on 5/1 ?Note: Pt received decadron 5 mg yesterday during surgery ? ?Inpatient Diabetes Program Recommendations:   ? ?To cover hyperglycemia spikes due to decadron dose consider the following: ? ?-  Start Novolog 0-15 units tid + hs  ? ?Thanks, ? ?Tama Headings RN, MSN, BC-ADM ?Inpatient Diabetes Coordinator ?Team Pager [phone] (8a-5p) ? ? ?

## 2021-10-08 NOTE — Progress Notes (Signed)
Physical Therapy Treatment ?Patient Details ?Name: Johnny Huber ?MRN: [id number] ?DOB: 09/22/1945 ?Today's Date: 10/08/2021 ? ? ?History of Present Illness Pt is 76 yo M s/p L TKA on 10/07/21. PMH: DM, HTN, CAD ? ?  ?PT Comments  ? ? Pt was able to ambulate 150ft modI with RW and able to negotiate 3 steps with bilateral rails and min guard. Pt able to demonstrate ability to don L knee immobilizer. Education provided on HEP and pt was able to demonstrate understanding. Pt is discharged from acute PT but would benefit from continue therapy upon discharge.  ?   ?Recommendations for follow up therapy are one component of a multi-disciplinary discharge planning process, led by the attending physician.  Recommendations may be updated based on patient status, additional functional criteria and insurance authorization. ? ?Follow Up Recommendations ? Follow physician's recommendations for discharge plan and follow up therapies ?  ?  ?Assistance Recommended at Discharge Intermittent Supervision/Assistance  ?Patient can return home with the following Assistance with cooking/housework;A little help with bathing/dressing/bathroom;Assist for transportation;Help with stairs or ramp for entrance ?  ?Equipment Recommendations ? None recommended by PT  ?  ?Recommendations for Other Services   ? ? ?  ?Precautions / Restrictions Precautions ?Precautions: Knee;Fall ?Required Braces or Orthoses: Knee Immobilizer - Left ?Knee Immobilizer - Left: On at all times;Other (comment) (except when doing therex) ?Restrictions ?Weight Bearing Restrictions: Yes ?LLE Weight Bearing: Weight bearing as tolerated  ?  ? ?Mobility ? Bed Mobility ?Overal bed mobility: Independent ?  ?  ?  ?  ?  ?  ?  ?  ? ?Transfers ?Overall transfer level: Modified independent ?Equipment used: Rolling walker (2 wheels) ?  ?  ?  ?  ?  ?  ?  ?  ?  ? ?Ambulation/Gait ?Ambulation/Gait assistance: Modified independent (Device/Increase time) ?Gait Distance (Feet): 150  Feet ?Assistive device: Rolling walker (2 wheels) ?Gait Pattern/deviations: Step-through pattern, Decreased weight shift to left, Decreased stance time - left, Shuffle ?Gait velocity: decreased ?  ?  ?General Gait Details: verbal cues to clear LLE and walker use ? ? ?Stairs ?Stairs: Yes ?Stairs assistance: Min guard ?Stair Management: Two rails, Step to pattern ?Number of Stairs: 3 ?General stair comments: min guard for safety, verbal cues for pattern ? ? ?Wheelchair Mobility ?  ? ?Modified Rankin (Stroke Patients Only) ?  ? ? ?  ?Balance Overall balance assessment: Modified Independent ?  ?  ?  ?  ?  ?  ?  ?  ?  ?  ?  ?  ?  ?  ?  ?  ?  ?  ?  ? ?  ?Cognition Arousal/Alertness: Awake/alert ?Behavior During Therapy: WFL for tasks assessed/performed ?Overall Cognitive Status: Within Functional Limits for tasks assessed ?  ?  ?  ?  ?  ?  ?  ?  ?  ?  ?  ?  ?  ?  ?  ?  ?  ?  ?  ? ?  ?Exercises Total Joint Exercises ?Quad Sets: Left, 10 reps ?Heel Slides: Left, 10 reps ?Straight Leg Raises: Left, 10 reps ?Goniometric ROM: 10-95 ? ?  ?General Comments   ?  ?  ? ?Pertinent Vitals/Pain Pain Assessment ?Pain Assessment: Faces ?Faces Pain Scale: Hurts little more ?Pain Location: L knee ?Pain Descriptors / Indicators: Grimacing, Guarding ?Pain Intervention(s): Monitored during session  ? ? ?Home Living   ?  ?  ?  ?  ?  ?  ?  ?  ?  ?   ?  ?  Prior Function    ?  ?  ?   ? ?PT Goals (current goals can now be found in the care plan section) Acute Rehab PT Goals ?Patient Stated Goal: to return home ?Potential to Achieve Goals: Good ?Progress towards PT goals: Goals met/education completed, patient discharged from PT ? ?  ?Frequency ? ? ?   ? ? ? ?  ?PT Plan Other (comment) (d/c from acute PT)  ? ? ?Co-evaluation   ?  ?  ?  ?  ? ?  ?AM-PAC PT "6 Clicks" Mobility   ?Outcome Measure ? Help needed turning from your back to your side while in a flat bed without using bedrails?: None ?Help needed moving from lying on your back to sitting on  the side of a flat bed without using bedrails?: None ?Help needed moving to and from a bed to a chair (including a wheelchair)?: None ?Help needed standing up from a chair using your arms (e.g., wheelchair or bedside chair)?: None ?Help needed to walk in hospital room?: None ?Help needed climbing 3-5 steps with a railing? : A Little ?6 Click Score: 23 ? ?  ?End of Session Equipment Utilized During Treatment: Gait belt;Left knee immobilizer ?Activity Tolerance: Patient tolerated treatment well ?Patient left: with call bell/phone within reach;in chair ?Nurse Communication: Mobility status ?PT Visit Diagnosis: Difficulty in walking, not elsewhere classified (R26.2) ?  ? ? ?Time: 1319-1347 ?PT Time Calculation (min) (ACUTE ONLY): 28 min ? ?Charges:  $Gait Training: 23-37 mins          ?          ? ?Kesha Porter, SPT ?Acute Rehabilitation Services  ? ? ?Kesha Porter ?10/08/2021, 2:48 PM ? ?

## 2021-10-09 ENCOUNTER — Ambulatory Visit: Payer: Medicare Other | Admitting: Physician Assistant

## 2021-10-10 ENCOUNTER — Telehealth: Payer: Self-pay | Admitting: Radiology

## 2021-10-10 NOTE — Telephone Encounter (Signed)
noted 

## 2021-10-10 NOTE — Telephone Encounter (Signed)
Patient called [facility] office stating that he would like to use [facility] for HHPT and they need a referral. Alvis Lemmings was supposed to come out, but he has not heard from them in regards to setting this up. He is status post TKA on 10/07/2021.   Sheri-is there any way that you could get this referral sent in for me? I am in [place] and have limited access to all of my paperwork, etc.  CB for patient is [phone]

## 2021-10-17 ENCOUNTER — Ambulatory Visit (INDEPENDENT_AMBULATORY_CARE_PROVIDER_SITE_OTHER): Payer: Medicare Other | Admitting: Orthopaedic Surgery

## 2021-10-17 ENCOUNTER — Ambulatory Visit (INDEPENDENT_AMBULATORY_CARE_PROVIDER_SITE_OTHER): Payer: Medicare Other

## 2021-10-17 ENCOUNTER — Encounter: Payer: Self-pay | Admitting: Orthopaedic Surgery

## 2021-10-17 VITALS — Ht 71.0 in | Wt 213.0 lb

## 2021-10-17 DIAGNOSIS — Z96652 Presence of left artificial knee joint: Secondary | ICD-10-CM

## 2021-10-17 MED ORDER — OXYCODONE-ACETAMINOPHEN 5-325 MG PO TABS: 1.0000 | ORAL_TABLET | ORAL | 0 refills | Status: DC | PRN

## 2021-10-17 NOTE — Addendum Note (Signed)
Addended by: Meyer Cory on: 10/17/2021 11:39 AM   Modules accepted: Orders

## 2021-10-17 NOTE — Progress Notes (Signed)
Post-Op Visit Note   Patient: Johnny Huber           Date of Birth: [birth year]           MRN: [id number] Visit Date: 10/17/2021 PCP: Bridget Hartshorn, NP   Assessment & Plan: Postop left total knee arthroplasty.  He needs to use his teds since he has some mild pitting edema left lower extremity.  Incision looks good return 1 week for staple removal.Pain medication renewed new dressing applied.  We will start outpatient therapy next week.  Chief Complaint:  Chief Complaint  Patient presents with   Left Knee - Routine Post Op    10/07/2021 Left TKA   Visit Diagnoses:  1. S/P total knee arthroplasty, left     Plan: Return in 1 week for staple removal.  Follow-Up Instructions: No follow-ups on file.   Orders:  Orders Placed This Encounter  Procedures   XR Knee 1-2 Views Left   No orders of the defined types were placed in this encounter.   Imaging: No results found.  PMFS History: Patient Active Problem List   Diagnosis Date Noted   Arthritis of left knee 10/07/2021   Unilateral primary osteoarthritis, left knee 06/13/2021   Spondylosis without myelopathy or radiculopathy, cervical region 04/07/2019   Hx of total knee arthroplasty, right 04/07/2019   Chronic right-sided low back pain with right-sided sciatica 01/01/2017   Hypercholesteremia 03/11/2016   Class 1 obesity due to excess calories with serious comorbidity and body mass index (BMI) of 34.0 to 34.9 in adult 03/11/2016   ANEMIA 12/31/2009   RECTAL BLEEDING 12/31/2009   DM type 2 causing vascular disease ([place]) 04/12/2007   KNEE PAIN 04/12/2007   Essential hypertension, benign 04/12/2007   Past Medical History:  Diagnosis Date   Asthmatic bronchitis    as a child   Coronary atherosclerosis of native coronary artery    BMS to RCA 2015   DDD (degenerative disc disease), lumbar    Diabetes ([place])    Gastroesophageal reflux disease    Hypertension    Insomnia, unspecified    Obstructive sleep  apnea    uses cpap   Pneumonia    Prostate cancer ([place])    had prostate removed   Restless legs syndrome    Rhinitis, allergic     Family History  Problem Relation Age of Onset   Cancer Sister     Past Surgical History:  Procedure Laterality Date   CARDIAC CATHETERIZATION  2015   CORONARY ANGIOPLASTY     OTHER SURGICAL HISTORY  1983   R KNEE ARTHORSCOPY x 3   PROSTATECTOMY  2011   TONSILLECTOMY     TOTAL KNEE ARTHROPLASTY Right 12/09/2017   CEMENTED    TOTAL KNEE ARTHROPLASTY Right 12/09/2017   Procedure: RIGHT TOTAL KNEE ARTHROPLASTY-CEMENTED;  Surgeon: [name], MD;  Location: [place];  Service: Orthopedics;  Laterality: Right;   TOTAL KNEE ARTHROPLASTY Left 10/07/2021   Procedure: LEFT TOTAL KNEE ARTHROPLASTY;  Surgeon: [name], MD;  Location: [place];  Service: Orthopedics;  Laterality: Left;   Social History   Occupational History   Not on file  Tobacco Use   Smoking status: Former    Types: Cigarettes    Quit date: 09/01/1982    Years since quitting: 39.1   Smokeless tobacco: Never  Vaping Use   Vaping Use: Never used  Substance and Sexual Activity   Alcohol use: No    Comment: heavy drinker in  the past, none for 15 years (as of 11/2017)   Drug use: No   Sexual activity: Yes    Partners: Female

## 2021-10-20 ENCOUNTER — Other Ambulatory Visit: Payer: Self-pay | Admitting: Cardiology

## 2021-10-22 ENCOUNTER — Telehealth: Payer: Self-pay | Admitting: Orthopaedic Surgery

## 2021-10-22 ENCOUNTER — Other Ambulatory Visit: Payer: Self-pay | Admitting: Orthopaedic Surgery

## 2021-10-22 MED ORDER — OXYCODONE-ACETAMINOPHEN 5-325 MG PO TABS: 1.0000 | ORAL_TABLET | ORAL | 0 refills | Status: DC | PRN

## 2021-10-22 NOTE — Telephone Encounter (Signed)
noted 

## 2021-10-22 NOTE — Telephone Encounter (Signed)
Pt wife called and states her husband is in a lot of pain. Would like refill on pain medicine.

## 2021-10-22 NOTE — Telephone Encounter (Signed)
Please advise 

## 2021-10-24 ENCOUNTER — Ambulatory Visit (INDEPENDENT_AMBULATORY_CARE_PROVIDER_SITE_OTHER): Payer: Medicare Other | Admitting: Orthopaedic Surgery

## 2021-10-24 DIAGNOSIS — Z96652 Presence of left artificial knee joint: Secondary | ICD-10-CM

## 2021-10-24 MED ORDER — OXYCODONE-ACETAMINOPHEN 5-325 MG PO TABS: 1.0000 | ORAL_TABLET | ORAL | 0 refills | Status: AC | PRN

## 2021-10-24 NOTE — Progress Notes (Signed)
Post-Op Visit Note   Patient: Johnny Huber           Date of Birth: [birth year]           MRN: [id number] Visit Date: 10/24/2021 PCP: Bridget Hartshorn, NP   Assessment & Plan: Patient returns staples removed left knee.  He is in home therapy starting outpatient therapy next week.  He has been taking some oxycodone and requested a refill.  Chief Complaint:  Chief Complaint  Patient presents with   Left Knee - Routine Post Op    It hurts some but not bad, but its all the time. I have been using ice and it seems to help some.   Visit Diagnoses:  1. History of total knee arthroplasty, left     Plan: Final third tablet of Percocet prescribed we discussed weaning off.  He can use Tylenol or ibuprofen.  Outpatient therapy Staples removed today.  Follow-Up Instructions: Return in about 4 weeks (around 11/21/2021).   Orders:  No orders of the defined types were placed in this encounter.  No orders of the defined types were placed in this encounter.   Imaging: No results found.  PMFS History: Patient Active Problem List   Diagnosis Date Noted   History of total knee arthroplasty, left 10/24/2021   Arthritis of left knee 10/07/2021   Spondylosis without myelopathy or radiculopathy, cervical region 04/07/2019   Hx of total knee arthroplasty, right 04/07/2019   Chronic right-sided low back pain with right-sided sciatica 01/01/2017   Hypercholesteremia 03/11/2016   Class 1 obesity due to excess calories with serious comorbidity and body mass index (BMI) of 34.0 to 34.9 in adult 03/11/2016   ANEMIA 12/31/2009   RECTAL BLEEDING 12/31/2009   DM type 2 causing vascular disease ([place]) 04/12/2007   KNEE PAIN 04/12/2007   Essential hypertension, benign 04/12/2007   Past Medical History:  Diagnosis Date   Asthmatic bronchitis    as a child   Coronary atherosclerosis of native coronary artery    BMS to RCA 2015   DDD (degenerative disc disease), lumbar    Diabetes ([place])     Gastroesophageal reflux disease    Hypertension    Insomnia, unspecified    Obstructive sleep apnea    uses cpap   Pneumonia    Prostate cancer ([place])    had prostate removed   Restless legs syndrome    Rhinitis, allergic     Family History  Problem Relation Age of Onset   Cancer Sister     Past Surgical History:  Procedure Laterality Date   CARDIAC CATHETERIZATION  2015   CORONARY ANGIOPLASTY     OTHER SURGICAL HISTORY  1983   R KNEE ARTHORSCOPY x 3   PROSTATECTOMY  2011   TONSILLECTOMY     TOTAL KNEE ARTHROPLASTY Right 12/09/2017   CEMENTED    TOTAL KNEE ARTHROPLASTY Right 12/09/2017   Procedure: RIGHT TOTAL KNEE ARTHROPLASTY-CEMENTED;  Surgeon: [name], MD;  Location: [place];  Service: Orthopedics;  Laterality: Right;   TOTAL KNEE ARTHROPLASTY Left 10/07/2021   Procedure: LEFT TOTAL KNEE ARTHROPLASTY;  Surgeon: [name], MD;  Location: [place];  Service: Orthopedics;  Laterality: Left;   Social History   Occupational History   Not on file  Tobacco Use   Smoking status: Former    Types: Cigarettes    Quit date: 09/01/1982    Years since quitting: 39.1   Smokeless tobacco: Never  Vaping Use   Vaping  Use: Never used  Substance and Sexual Activity   Alcohol use: No    Comment: 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

## 2021-10-29 NOTE — Discharge Summary (Signed)
Patient ID: Johnny Huber MRN: [id number] DOB/AGE: [birth year] 76 y.o.  Admit date: 10/07/2021 Discharge date: 10/08/2021  Admission Diagnoses:  Principal Problem:   Arthritis of left knee   Discharge Diagnoses:  Principal Problem:   Arthritis of left knee  status post Procedure(s): LEFT TOTAL KNEE ARTHROPLASTY  Past Medical History:  Diagnosis Date   Asthmatic bronchitis    as a child   Coronary atherosclerosis of native coronary artery    BMS to RCA 2015   DDD (degenerative disc disease), lumbar    Diabetes ([place])    Gastroesophageal reflux disease    Hypertension    Insomnia, unspecified    Obstructive sleep apnea    uses cpap   Pneumonia    Prostate cancer ([place])    had prostate removed   Restless legs syndrome    Rhinitis, allergic     Surgeries: Procedure(s): LEFT TOTAL KNEE ARTHROPLASTY on 10/07/2021   Consultants:   Discharged Condition: Improved  Hospital Course: Johnny Huber is an 76 y.o. male who was admitted 10/07/2021 for operative treatment of Arthritis of left knee. Patient failed conservative treatments (please see the history and physical for the specifics) and had severe unremitting pain that affects sleep, daily activities and work/hobbies. After pre-op clearance, the patient was taken to the operating room on 10/07/2021 and underwent  Procedure(s): LEFT TOTAL KNEE ARTHROPLASTY.    Patient was given perioperative antibiotics:  Anti-infectives (From admission, onward)    Start     Dose/Rate Route Frequency Ordered Stop   10/07/21 1100  ceFAZolin (ANCEF) IVPB 2g/100 mL premix        2 g 200 mL/hr over 30 Minutes Intravenous On call to O.R. 10/07/21 1046 10/07/21 1332        Patient was given sequential compression devices and early ambulation to prevent DVT.   Patient benefited maximally from hospital stay and there were no complications. At the time of discharge, the patient was urinating/moving their bowels without difficulty,  tolerating a regular diet, pain is controlled with oral pain medications and they have been cleared by PT/OT.   Recent vital signs: No data found.   Recent laboratory studies: No results for input(s): WBC, HGB, HCT, PLT, NA, K, CL, CO2, BUN, CREATININE, GLUCOSE, INR, CALCIUM in the last 72 hours.  Invalid input(s): PT, 2   Discharge Medications:   Allergies as of 10/08/2021   No Known Allergies      Medication List     STOP taking these medications    diclofenac 75 MG EC tablet Commonly known as: VOLTAREN       TAKE these medications    albuterol 108 (90 Base) MCG/ACT inhaler Commonly known as: VENTOLIN HFA Inhale 2 puffs into the lungs every 6 (six) hours as needed for wheezing or shortness of breath.   amLODipine 5 MG tablet Commonly known as: NORVASC Take 5 mg by mouth daily.   aspirin EC 325 MG tablet Take 1 tablet (325 mg total) by mouth daily. MUST TAKE AT LEAST 4 WEEKS POSTOP 4 DVT PROPHYLAXIS What changed:  medication strength how much to take additional instructions   atorvastatin 80 MG tablet Commonly known as: LIPITOR TAKE 1 TABLET DAILY   B-12 COMPLIANCE INJECTION IJ Inject as directed every 14 (fourteen) days.   BD Veo Insulin Syringe U/F 31G X 15/64" 0.3 ML Misc Generic drug: Insulin Syringe-Needle U-100   DULoxetine 30 MG capsule Commonly known as: CYMBALTA Take 30 mg by mouth daily.   glucose  blood test strip Commonly known as: FREESTYLE LITE Use as instructed bid   Insulin Pen Needle 31G X 8 MM Misc Commonly known as: B-D ULTRAFINE III SHORT PEN 1 each by Does not apply route as directed.   Lantus SoloStar 100 UNIT/ML Solostar Pen Generic drug: insulin glargine Inject 28 Units into the skin every morning.   linaclotide 72 MCG capsule Commonly known as: LINZESS Take 72 mcg by mouth daily as needed (Constipation).   loratadine 10 MG tablet Commonly known as: CLARITIN Take 10 mg by mouth daily.   methocarbamol 500 MG  tablet Commonly known as: ROBAXIN Take 1 tablet (500 mg total) by mouth every 6 (six) hours as needed for muscle spasms.   metoCLOPramide 10 MG tablet Commonly known as: REGLAN Take 10 mg by mouth 3 (three) times daily with meals.   mometasone 50 MCG/ACT nasal spray Commonly known as: NASONEX Place 2 sprays into the nose daily as needed (Congestion).   multivitamin with minerals tablet Take 1 tablet by mouth daily. Men's   nitroGLYCERIN 0.4 MG SL tablet Commonly known as: NITROSTAT Place 0.4 mg under the tongue every 5 (five) minutes as needed for chest pain.   ondansetron 8 MG tablet Commonly known as: ZOFRAN Take 8 mg by mouth 3 (three) times daily.   pantoprazole 40 MG tablet Commonly known as: PROTONIX Take 40 mg by mouth daily.   rOPINIRole 1 MG tablet Commonly known as: REQUIP Take 1 mg by mouth 2 (two) times daily.        Diagnostic Studies: DG Chest 2 View  Result Date: 10/05/2021 CLINICAL DATA:  Preop evaluation for upcoming knee replacement, initial encounter EXAM: CHEST - 2 VIEW COMPARISON:  11/27/2017 FINDINGS: Cardiac shadow is within normal limits. Lungs are well aerated without focal infiltrate or sizable effusion. No bony abnormality is seen. Prior cardiac stenting is seen. IMPRESSION: No acute abnormality noted. Electronically Signed   By: Inez Catalina M.D.   On: 10/05/2021 21:43   DG Knee 1-2 Views Left  Result Date: 10/07/2021 CLINICAL DATA:  Left knee replacement. EXAM: LEFT KNEE - 1-2 VIEW COMPARISON:  MRI of the left knee 05/30/2021. FINDINGS: Patient is status post left total knee arthroplasty. Acetabular and femoral components are well seated. Disc spacing is symmetric. Components are well seated. Gas and fluid are present in the joint as expected. IMPRESSION: Left total knee arthroplasty without radiographic evidence for complication. Electronically Signed   By: San Morelle M.D.   On: 10/07/2021 16:56   XR Knee 1-2 Views Left  Result  Date: 10/17/2021 AP lateral left knee images demonstrate bilateral total knee arthroplasty staples intact good position alignment no loosening or subsidence. Impression: Satisfactory postop left total knee arthroplasty.      Follow-up Information     [name], MD Follow up in 1 week(s).   Specialty: Orthopedic Surgery Contact information: [place] [address] [phone]         Care, [facility] Follow up.   Specialty: [place] Why: HHPT arranged- office will call you to schedule home visits Contact information: [place] [address] [phone]                 Discharge Plan:  discharge to   Disposition:     Signed: Benjiman Core for Dr. Melina Schools [facility] [phone] 10/29/2021, 9:16 AM       Patient ID: CURTIES CONIGLIARO MRN: [id number] DOB/AGE: 1945/08/13 76  y.o.  Admit date: 10/07/2021 Discharge date: 10/29/2021  Admission Diagnoses:  Principal Problem:   Arthritis of left knee   Discharge Diagnoses:  Principal Problem:   Arthritis of left knee  status post Procedure(s): LEFT TOTAL KNEE ARTHROPLASTY  Past Medical History:  Diagnosis Date   Asthmatic bronchitis    as a child   Coronary atherosclerosis of native coronary artery    BMS to RCA 2015   DDD (degenerative disc disease), lumbar    Diabetes ([place])    Gastroesophageal reflux disease    Hypertension    Insomnia, unspecified    Obstructive sleep apnea    uses cpap   Pneumonia    Prostate cancer ([place])    had prostate removed   Restless legs syndrome    Rhinitis, allergic     Surgeries: Procedure(s): LEFT TOTAL KNEE ARTHROPLASTY on 10/07/2021   Consultants:   Discharged Condition: Improved  Hospital Course: Johnny Huber is an 76 y.o. male who was admitted 10/07/2021 for operative treatment of Arthritis of left knee. Patient failed conservative treatments (please see the history and  physical for the specifics) and had severe unremitting pain that affects sleep, daily activities and work/hobbies. After pre-op clearance, the patient was taken to the operating room on 10/07/2021 and underwent  Procedure(s): LEFT TOTAL KNEE ARTHROPLASTY.    Patient was given perioperative antibiotics:  Anti-infectives (From admission, onward)    Start     Dose/Rate Route Frequency Ordered Stop   10/07/21 1100  ceFAZolin (ANCEF) IVPB 2g/100 mL premix        2 g 200 mL/hr over 30 Minutes Intravenous On call to O.R. 10/07/21 1046 10/07/21 1332        Patient was given sequential compression devices and early ambulation to prevent DVT.   Patient benefited maximally from hospital stay and there were no complications. At the time of discharge, the patient was urinating/moving their bowels without difficulty, tolerating a regular diet, pain is controlled with oral pain medications and they have been cleared by PT/OT.   Recent vital signs: No data found.   Recent laboratory studies: No results for input(s): WBC, HGB, HCT, PLT, NA, K, CL, CO2, BUN, CREATININE, GLUCOSE, INR, CALCIUM in the last 72 hours.  Invalid input(s): PT, 2   Discharge Medications:   Allergies as of 10/08/2021   No Known Allergies      Medication List     STOP taking these medications    diclofenac 75 MG EC tablet Commonly known as: VOLTAREN       TAKE these medications    albuterol 108 (90 Base) MCG/ACT inhaler Commonly known as: VENTOLIN HFA Inhale 2 puffs into the lungs every 6 (six) hours as needed for wheezing or shortness of breath.   amLODipine 5 MG tablet Commonly known as: NORVASC Take 5 mg by mouth daily.   aspirin EC 325 MG tablet Take 1 tablet (325 mg total) by mouth daily. MUST TAKE AT LEAST 4 WEEKS POSTOP 4 DVT PROPHYLAXIS What changed:  medication strength how much to take additional instructions   atorvastatin 80 MG tablet Commonly known as: LIPITOR TAKE 1 TABLET DAILY   B-12  COMPLIANCE INJECTION IJ Inject as directed every 14 (fourteen) days.   BD Veo Insulin Syringe U/F 31G X 15/64" 0.3 ML Misc Generic drug: Insulin Syringe-Needle U-100   DULoxetine 30 MG capsule Commonly known as: CYMBALTA Take 30 mg by mouth daily.   glucose blood test strip Commonly known as: FREESTYLE LITE Use as  instructed bid   Insulin Pen Needle 31G X 8 MM Misc Commonly known as: B-D ULTRAFINE III SHORT PEN 1 each by Does not apply route as directed.   Lantus SoloStar 100 UNIT/ML Solostar Pen Generic drug: insulin glargine Inject 28 Units into the skin every morning.   linaclotide 72 MCG capsule Commonly known as: LINZESS Take 72 mcg by mouth daily as needed (Constipation).   loratadine 10 MG tablet Commonly known as: CLARITIN Take 10 mg by mouth daily.   methocarbamol 500 MG tablet Commonly known as: ROBAXIN Take 1 tablet (500 mg total) by mouth every 6 (six) hours as needed for muscle spasms.   metoCLOPramide 10 MG tablet Commonly known as: REGLAN Take 10 mg by mouth 3 (three) times daily with meals.   mometasone 50 MCG/ACT nasal spray Commonly known as: NASONEX Place 2 sprays into the nose daily as needed (Congestion).   multivitamin with minerals tablet Take 1 tablet by mouth daily. Men's   nitroGLYCERIN 0.4 MG SL tablet Commonly known as: NITROSTAT Place 0.4 mg under the tongue every 5 (five) minutes as needed for chest pain.   ondansetron 8 MG tablet Commonly known as: ZOFRAN Take 8 mg by mouth 3 (three) times daily.   pantoprazole 40 MG tablet Commonly known as: PROTONIX Take 40 mg by mouth daily.   rOPINIRole 1 MG tablet Commonly known as: REQUIP Take 1 mg by mouth 2 (two) times daily.        Diagnostic Studies: DG Chest 2 View  Result Date: 10/05/2021 CLINICAL DATA:  Preop evaluation for upcoming knee replacement, initial encounter EXAM: CHEST - 2 VIEW COMPARISON:  11/27/2017 FINDINGS: Cardiac shadow is within normal limits. Lungs are  well aerated without focal infiltrate or sizable effusion. No bony abnormality is seen. Prior cardiac stenting is seen. IMPRESSION: No acute abnormality noted. Electronically Signed   By: Inez Catalina M.D.   On: 10/05/2021 21:43   DG Knee 1-2 Views Left  Result Date: 10/07/2021 CLINICAL DATA:  Left knee replacement. EXAM: LEFT KNEE - 1-2 VIEW COMPARISON:  MRI of the left knee 05/30/2021. FINDINGS: Patient is status post left total knee arthroplasty. Acetabular and femoral components are well seated. Disc spacing is symmetric. Components are well seated. Gas and fluid are present in the joint as expected. IMPRESSION: Left total knee arthroplasty without radiographic evidence for complication. Electronically Signed   By: San Morelle M.D.   On: 10/07/2021 16:56   XR Knee 1-2 Views Left  Result Date: 10/17/2021 AP lateral left knee images demonstrate bilateral total knee arthroplasty staples intact good position alignment no loosening or subsidence. Impression: Satisfactory postop left total knee arthroplasty.      Follow-up Information     [name], MD Follow up in 1 week(s).   Specialty: Orthopedic Surgery Contact information: [place] [address] [phone]         Care, [facility] Follow up.   Specialty: [place] Why: HHPT arranged- office will call you to schedule home visits Contact information: [place] [address] [phone]                 Discharge Plan:  discharge to home  Disposition:     Signed: Benjiman Core  10/29/2021, 9:16 AM

## 2021-11-21 ENCOUNTER — Ambulatory Visit (INDEPENDENT_AMBULATORY_CARE_PROVIDER_SITE_OTHER): Payer: Medicare Other | Admitting: Orthopaedic Surgery

## 2021-11-21 ENCOUNTER — Encounter: Payer: Self-pay | Admitting: Orthopaedic Surgery

## 2021-11-21 VITALS — Ht 71.0 in | Wt 213.0 lb

## 2021-11-21 DIAGNOSIS — Z96652 Presence of left artificial knee joint: Secondary | ICD-10-CM

## 2021-11-21 NOTE — Progress Notes (Signed)
Post-Op Visit Note   Patient: Johnny Huber           Date of Birth: 09/16/1945           MRN: [id number] Visit Date: 11/21/2021 PCP: Bridget Hartshorn, NP   Assessment & Plan: 6 weeks post left total knee.  Right knee 4 years ago had to use her therapy does not have full extension and has 10 degree extension lag.  Passively needs to work on prone positioning which we discussed last visit but has not done it.  We discussed he needs to get full extension and more quad strength otherwise he will continue to have significant knee pain.  He is flexing to 100 easily.  Chief Complaint:  Chief Complaint  Patient presents with   Left Knee - Routine Post Op    LT TKA DOS 10/07/21   Visit Diagnoses:  1. History of total knee arthroplasty, left     Plan: Patient will work on prone position with weight on his ankle recheck 4 weeks.  Follow-Up Instructions: Return in about 4 weeks (around 12/19/2021).   Orders:  No orders of the defined types were placed in this encounter.  No orders of the defined types were placed in this encounter.   Imaging: No results found.  PMFS History: Patient Active Problem List   Diagnosis Date Noted   History of total knee arthroplasty, left 10/24/2021   Arthritis of left knee 10/07/2021   Spondylosis without myelopathy or radiculopathy, cervical region 04/07/2019   Hx of total knee arthroplasty, right 04/07/2019   Chronic right-sided low back pain with right-sided sciatica 01/01/2017   Hypercholesteremia 03/11/2016   Class 1 obesity due to excess calories with serious comorbidity and body mass index (BMI) of 34.0 to 34.9 in adult 03/11/2016   ANEMIA 12/31/2009   RECTAL BLEEDING 12/31/2009   DM type 2 causing vascular disease ([place]) 04/12/2007   KNEE PAIN 04/12/2007   Essential hypertension, benign 04/12/2007   Past Medical History:  Diagnosis Date   Asthmatic bronchitis    as a child   Coronary atherosclerosis of native coronary artery     BMS to RCA 2015   DDD (degenerative disc disease), lumbar    Diabetes ([place])    Gastroesophageal reflux disease    Hypertension    Insomnia, unspecified    Obstructive sleep apnea    uses cpap   Pneumonia    Prostate cancer ([place])    had prostate removed   Restless legs syndrome    Rhinitis, allergic     Family History  Problem Relation Age of Onset   Cancer Sister     Past Surgical History:  Procedure Laterality Date   CARDIAC CATHETERIZATION  2015   CORONARY ANGIOPLASTY     OTHER SURGICAL HISTORY  1983   R KNEE ARTHORSCOPY x 3   PROSTATECTOMY  2011   TONSILLECTOMY     TOTAL KNEE ARTHROPLASTY Right 12/09/2017   CEMENTED    TOTAL KNEE ARTHROPLASTY Right 12/09/2017   Procedure: RIGHT TOTAL KNEE ARTHROPLASTY-CEMENTED;  Surgeon: [name], MD;  Location: [facility];  Service: Orthopedics;  Laterality: Right;   TOTAL KNEE ARTHROPLASTY Left 10/07/2021   Procedure: LEFT TOTAL KNEE ARTHROPLASTY;  Surgeon: [name], MD;  Location: [facility];  Service: Orthopedics;  Laterality: Left;   Social History   Occupational History   Not on file  Tobacco Use   Smoking status: Former    Types: Cigarettes    Quit  date: 09/01/1982    Years since quitting: 39.2   Smokeless tobacco: Never  Vaping Use   Vaping Use: Never used  Substance and Sexual Activity   Alcohol use: No    Comment: 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

## 2021-12-19 ENCOUNTER — Encounter: Payer: Medicare Other | Admitting: Orthopaedic Surgery

## 2021-12-24 ENCOUNTER — Other Ambulatory Visit: Payer: Self-pay | Admitting: Orthopaedic Surgery

## 2021-12-24 ENCOUNTER — Telehealth: Payer: Self-pay | Admitting: Orthopaedic Surgery

## 2021-12-24 NOTE — Telephone Encounter (Signed)
Pt's daughter Bryson Ha called requesting a call back from Dr. Lorin Mercy. Pt states pt is still experiencing lots of pain after surgey and need a call as soon as Dr Lorin Mercy has time. Please call pt's daughter Bryson Ha at [phone]

## 2021-12-24 NOTE — Telephone Encounter (Signed)
Please advise 

## 2021-12-26 ENCOUNTER — Ambulatory Visit (INDEPENDENT_AMBULATORY_CARE_PROVIDER_SITE_OTHER): Payer: Medicare Other | Admitting: Orthopaedic Surgery

## 2021-12-26 DIAGNOSIS — Z96651 Presence of right artificial knee joint: Secondary | ICD-10-CM

## 2021-12-26 DIAGNOSIS — Z96652 Presence of left artificial knee joint: Secondary | ICD-10-CM

## 2021-12-26 NOTE — Progress Notes (Signed)
Post-Op Visit Note   Patient: Johnny Huber           Date of Birth: 22-Mar-1946           MRN: [id number] Visit Date: 12/26/2021 PCP: Bridget Hartshorn, NP   Assessment & Plan: Post total knee arthroplasty left.  Still has flexion contracture.  He is only using 4 pounds weight we discussed using 5 to 10 pounds.  He is not able to tolerate prone positioning much we had a long discussion about the importance of getting his knee out to full extension to decrease pain and aid in walking.  He will redouble his efforts recheck 5 weeks.  Chief Complaint:  Chief Complaint  Patient presents with   Left Knee - Routine Post Op    10/07/21 (11w 3d) Left Total Knee Arthroplasty     Visit Diagnoses:  1. Hx of total knee arthroplasty, right   2. History of total knee arthroplasty, left     Plan: Return 5 weeks no x-ray needed on return.  Follow-Up Instructions: No follow-ups on file.   Orders:  No orders of the defined types were placed in this encounter.  No orders of the defined types were placed in this encounter.   Imaging: No results found.  PMFS History: Patient Active Problem List   Diagnosis Date Noted   History of total knee arthroplasty, left 10/24/2021   Spondylosis without myelopathy or radiculopathy, cervical region 04/07/2019   Hx of total knee arthroplasty, right 04/07/2019   Chronic right-sided low back pain with right-sided sciatica 01/01/2017   Hypercholesteremia 03/11/2016   Class 1 obesity due to excess calories with serious comorbidity and body mass index (BMI) of 34.0 to 34.9 in adult 03/11/2016   ANEMIA 12/31/2009   RECTAL BLEEDING 12/31/2009   DM type 2 causing vascular disease ([place]) 04/12/2007   KNEE PAIN 04/12/2007   Essential hypertension, benign 04/12/2007   Past Medical History:  Diagnosis Date   Asthmatic bronchitis    as a child   Coronary atherosclerosis of native coronary artery    BMS to RCA 2015   DDD (degenerative disc disease),  lumbar    Diabetes ([place])    Gastroesophageal reflux disease    Hypertension    Insomnia, unspecified    Obstructive sleep apnea    uses cpap   Pneumonia    Prostate cancer ([place])    had prostate removed   Restless legs syndrome    Rhinitis, allergic     Family History  Problem Relation Age of Onset   Cancer Sister     Past Surgical History:  Procedure Laterality Date   CARDIAC CATHETERIZATION  2015   CORONARY ANGIOPLASTY     OTHER SURGICAL HISTORY  1983   R KNEE ARTHORSCOPY x 3   PROSTATECTOMY  2011   TONSILLECTOMY     TOTAL KNEE ARTHROPLASTY Right 12/09/2017   CEMENTED    TOTAL KNEE ARTHROPLASTY Right 12/09/2017   Procedure: RIGHT TOTAL KNEE ARTHROPLASTY-CEMENTED;  Surgeon: [name], MD;  Location: [facility];  Service: Orthopedics;  Laterality: Right;   TOTAL KNEE ARTHROPLASTY Left 10/07/2021   Procedure: LEFT TOTAL KNEE ARTHROPLASTY;  Surgeon: [name], MD;  Location: [facility];  Service: Orthopedics;  Laterality: Left;   Social History   Occupational History   Not on file  Tobacco Use   Smoking status: Former    Types: Cigarettes    Quit date: 09/01/1982    Years since quitting: 28.3  Smokeless tobacco: Never  Vaping Use   Vaping Use: Never used  Substance and Sexual Activity   Alcohol use: No    Comment: 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

## 2022-01-30 ENCOUNTER — Encounter: Payer: Medicare Other | Admitting: Orthopaedic Surgery

## 2022-04-18 ENCOUNTER — Other Ambulatory Visit: Payer: Self-pay | Admitting: Cardiology

## 2022-09-25 ENCOUNTER — Other Ambulatory Visit: Payer: Self-pay | Admitting: Cardiology

## 2022-10-06 ENCOUNTER — Ambulatory Visit: Payer: Medicare Other | Admitting: General Practice

## 2022-10-17 ENCOUNTER — Other Ambulatory Visit: Payer: Self-pay | Admitting: Cardiology

## 2022-10-17 NOTE — Telephone Encounter (Signed)
Please keep upcoming appt for future refills. Thank you  °

## 2022-10-29 ENCOUNTER — Ambulatory Visit: Payer: Medicare Other | Attending: Nurse Practitioner | Admitting: Nurse Practitioner

## 2022-10-29 ENCOUNTER — Encounter: Payer: Self-pay | Admitting: Nurse Practitioner

## 2022-10-29 VITALS — BP 130/68 | HR 58 | Ht 71.0 in | Wt 213.4 lb

## 2022-10-29 DIAGNOSIS — I2510 Atherosclerotic heart disease of native coronary artery without angina pectoris: Secondary | ICD-10-CM | POA: Insufficient documentation

## 2022-10-29 DIAGNOSIS — E785 Hyperlipidemia, unspecified: Secondary | ICD-10-CM | POA: Diagnosis present

## 2022-10-29 DIAGNOSIS — I10 Essential (primary) hypertension: Secondary | ICD-10-CM

## 2022-10-29 NOTE — Progress Notes (Signed)
Office Visit    Patient Name: Johnny Huber Date of Encounter: 10/29/2022  PCP:  Rebecka Apley, NP   [facility]  Cardiologist:  Dina Rich, MD  Advanced Practice Provider:  No care team member to display Electrophysiologist:  None   Chief Complaint    Johnny Huber is a 77 y.o. male with a hx of CAD, HTN, HLD, T2DM, GERD, gastroparesis, OSA on CPAP, and prostate cancer, who presents today for 1 year follow-up.    Past Medical History    Past Medical History:  Diagnosis Date   Asthmatic bronchitis    as a child   Coronary atherosclerosis of native coronary artery    BMS to RCA 2015   DDD (degenerative disc disease), lumbar    Diabetes (HCC)    Gastroesophageal reflux disease    Hypertension    Insomnia, unspecified    Obstructive sleep apnea    uses cpap   Pneumonia    Prostate cancer (HCC)    had prostate removed   Restless legs syndrome    Rhinitis, allergic    Past Surgical History:  Procedure Laterality Date   CARDIAC CATHETERIZATION  2015   CORONARY ANGIOPLASTY     OTHER SURGICAL HISTORY  1983   R KNEE ARTHORSCOPY x 3   PROSTATECTOMY  2011   TONSILLECTOMY     TOTAL KNEE ARTHROPLASTY Right 12/09/2017   CEMENTED    TOTAL KNEE ARTHROPLASTY Right 12/09/2017   Procedure: RIGHT TOTAL KNEE ARTHROPLASTY-CEMENTED;  Surgeon: Eldred Manges, MD;  Location: [facility] OR;  Service: Orthopedics;  Laterality: Right;   TOTAL KNEE ARTHROPLASTY Left 10/07/2021   Procedure: LEFT TOTAL KNEE ARTHROPLASTY;  Surgeon: Eldred Manges, MD;  Location: [facility] OR;  Service: Orthopedics;  Laterality: Left;    Allergies  No Known Allergies  History of Present Illness    Johnny Huber is a 77 y.o. male with a PMH as mentioned above.   Previously followed with [facility].  Previous cardiovascular workup includes abnormal stress test in 2015, underwent cardiac catheterization and received subsequent PCI to RCA.  History of totally occluded  LAD with right to left collaterals.  Noted to have severe disease in circumflex, small caliber and was not felt amenable to PCI.  History of severe disease in RCA treated with BMS.  Last seen by Micah Flesher, PA-C in May 2023 for preoperative cardiovascular risk assessment, was pending left total knee arthroplasty at the time.  Was doing well from a cardiac perspective.  Today he presents for 1 year follow-up.  He states he is doing well. Denies any chest pain, shortness of breath, palpitations, syncope, presyncope, dizziness, orthopnea, PND, swelling or significant weight changes, acute bleeding, or claudication.  EKGs/Labs/Other Studies Reviewed:   The following studies were reviewed today:   EKG:  EKG is ordered today.  The ekg ordered today demonstrates sinus bradycardia, first-degree AV block, 58 bpm, nonspecific ST/T wave abnormality, no acute ischemic changes.   Recent Labs: No results found for requested labs within last 365 days.  Recent Lipid Panel    Component Value Date/Time   CHOL 101 10/04/2021 1603   TRIG 89 10/04/2021 1603   HDL 33 (L) 10/04/2021 1603   CHOLHDL 3.1 10/04/2021 1603   CHOLHDL 4.9 06/10/2016 0742   VLDL 26 06/10/2016 0742   LDLCALC 50 10/04/2021 1603   LDLDIRECT 54 10/04/2021 1603    Risk Assessment/Calculations:   The 10-year ASCVD risk score (Arnett DK, et  al., 2019) is: 53.8%   Values used to calculate the score:     Age: 62 years     Sex: Male     Is Non-Hispanic African American: No     Diabetic: Yes     Tobacco smoker: No     Systolic Blood Pressure: 130 mmHg     Is BP treated: Yes     HDL Cholesterol: 32 mg/dL     Total Cholesterol: 131 mg/dL   Home Medications   Current Meds  Medication Sig   albuterol (PROVENTIL HFA;VENTOLIN HFA) 108 (90 Base) MCG/ACT inhaler Inhale 2 puffs into the lungs every 6 (six) hours as needed for wheezing or shortness of breath.   amLODipine (NORVASC) 5 MG tablet Take 5 mg by mouth daily.   aspirin EC  325 MG tablet Take 1 tablet (325 mg total) by mouth daily. MUST TAKE AT LEAST 4 WEEKS POSTOP 4 DVT PROPHYLAXIS (Patient taking differently: Take 81 mg by mouth daily.)   atorvastatin (LIPITOR) 80 MG tablet TAKE 1 TABLET DAILY   BD VEO INSULIN SYRINGE U/F 31G X 15/64" 0.3 ML MISC    Cyanocobalamin (B-12 COMPLIANCE INJECTION IJ) Inject as directed every 14 (fourteen) days.   DULoxetine (CYMBALTA) 30 MG capsule Take 30 mg by mouth daily.   glucose blood (FREESTYLE LITE) test strip Use as instructed bid   Insulin Pen Needle (B-D ULTRAFINE III SHORT PEN) 31G X 8 MM MISC 1 each by Does not apply route as directed.   LANTUS SOLOSTAR 100 UNIT/ML Solostar Pen Inject 28 Units into the skin every morning.   loratadine (CLARITIN) 10 MG tablet Take 10 mg by mouth daily.   losartan (COZAAR) 50 MG tablet TAKE 1 TABLET DAILY (NEW MEDICATION 01/20/18)   metFORMIN (GLUCOPHAGE-XR) 500 MG 24 hr tablet Take 500 mg by mouth daily with breakfast.   mometasone (NASONEX) 50 MCG/ACT nasal spray Place 2 sprays into the nose daily as needed (Congestion).   Multiple Vitamins-Minerals (MULTIVITAMIN WITH MINERALS) tablet Take 1 tablet by mouth daily. Men's   nitroGLYCERIN (NITROSTAT) 0.4 MG SL tablet Place 0.4 mg under the tongue every 5 (five) minutes as needed for chest pain.   oxyCODONE-acetaminophen (PERCOCET/ROXICET) 5-325 MG tablet Take 1 tablet by mouth every 4 (four) hours as needed for severe pain.   pantoprazole (PROTONIX) 40 MG tablet Take 40 mg by mouth daily.   pregabalin (LYRICA) 150 MG capsule Take 150 mg by mouth 2 (two) times daily.   rOPINIRole (REQUIP) 1 MG tablet Take 1 mg by mouth 2 (two) times daily.      Review of Systems    All other systems reviewed and are otherwise negative except as noted above.  Physical Exam    VS:  BP 130/68   Pulse (!) 58   Ht 5\' 11"  (1.803 m)   Wt 213 lb 6.4 oz (96.8 kg)   SpO2 98%   BMI 29.76 kg/m  , BMI Body mass index is 29.76 kg/m.  Wt Readings from Last 3  Encounters:  10/29/22 213 lb 6.4 oz (96.8 kg)  11/21/21 213 lb (96.6 kg)  10/17/21 213 lb (96.6 kg)     GEN: Well nourished, well developed, in no acute distress. HEENT: normal. Neck: Supple, no JVD, carotid bruits, or masses. Cardiac: S1/S2, slow rate and regular rhythm, no murmurs, rubs, or gallops. No clubbing, cyanosis, edema.  Radials/PT 2+ and equal bilaterally.  Respiratory:  Respirations regular and unlabored, clear to auscultation bilaterally. MS: No deformity or atrophy.  Skin: Warm and dry, no rash. Neuro:  Strength and sensation are intact. Psych: Normal affect.  Assessment & Plan    CAD Stable with no anginal symptoms. No indication for ischemic evaluation. Continue Aspirin, atorvastatin, losartan, and NTG PRN. Heart healthy diet and regular cardiovascular exercise encouraged.   HTN BP stable. Discussed to monitor BP at home at least 2 hours after medications and sitting for 5-10 minutes. No medication changes at this time.   HLD, hypertriglyceridemia Recent LDL 66. Elevated TG. Continue atorvastatin. Heart healthy diet and regular cardiovascular exercise encouraged.    Disposition: Follow up in 1 year(s) with Dina Rich, MD or APP.  Signed, Sharlene Dory, NP 11/01/2022, 4:07 PM [facility]

## 2022-10-29 NOTE — Patient Instructions (Addendum)
Medication Instructions:  Your physician recommends that you continue on your current medications as directed. Please refer to the Current Medication list given to you today.  Labwork: none  Testing/Procedures: none  Follow-Up: Your physician recommends that you schedule a follow-up appointment in: 1 year with Dr. Wyline Mood. You will receive a reminder call in the mail in about 10 months reminding you to call and schedule your appointment. If you don't receive this call, please contact our office.  Any Other Special Instructions Will Be Listed Below (If Applicable).  If you need a refill on your cardiac medications before your next appointment, please call your pharmacy.

## 2023-09-21 ENCOUNTER — Other Ambulatory Visit: Payer: Self-pay | Admitting: Cardiology

## 2023-10-25 IMAGING — DX DG KNEE 1-2V*L*
2 series · 2 of 2 positions shown · non-contrast
Comparison: MRI of the left knee 05/30/2021.

CLINICAL DATA: Left knee replacement.

EXAM:
LEFT KNEE - 1-2 VIEW

[knee ap]
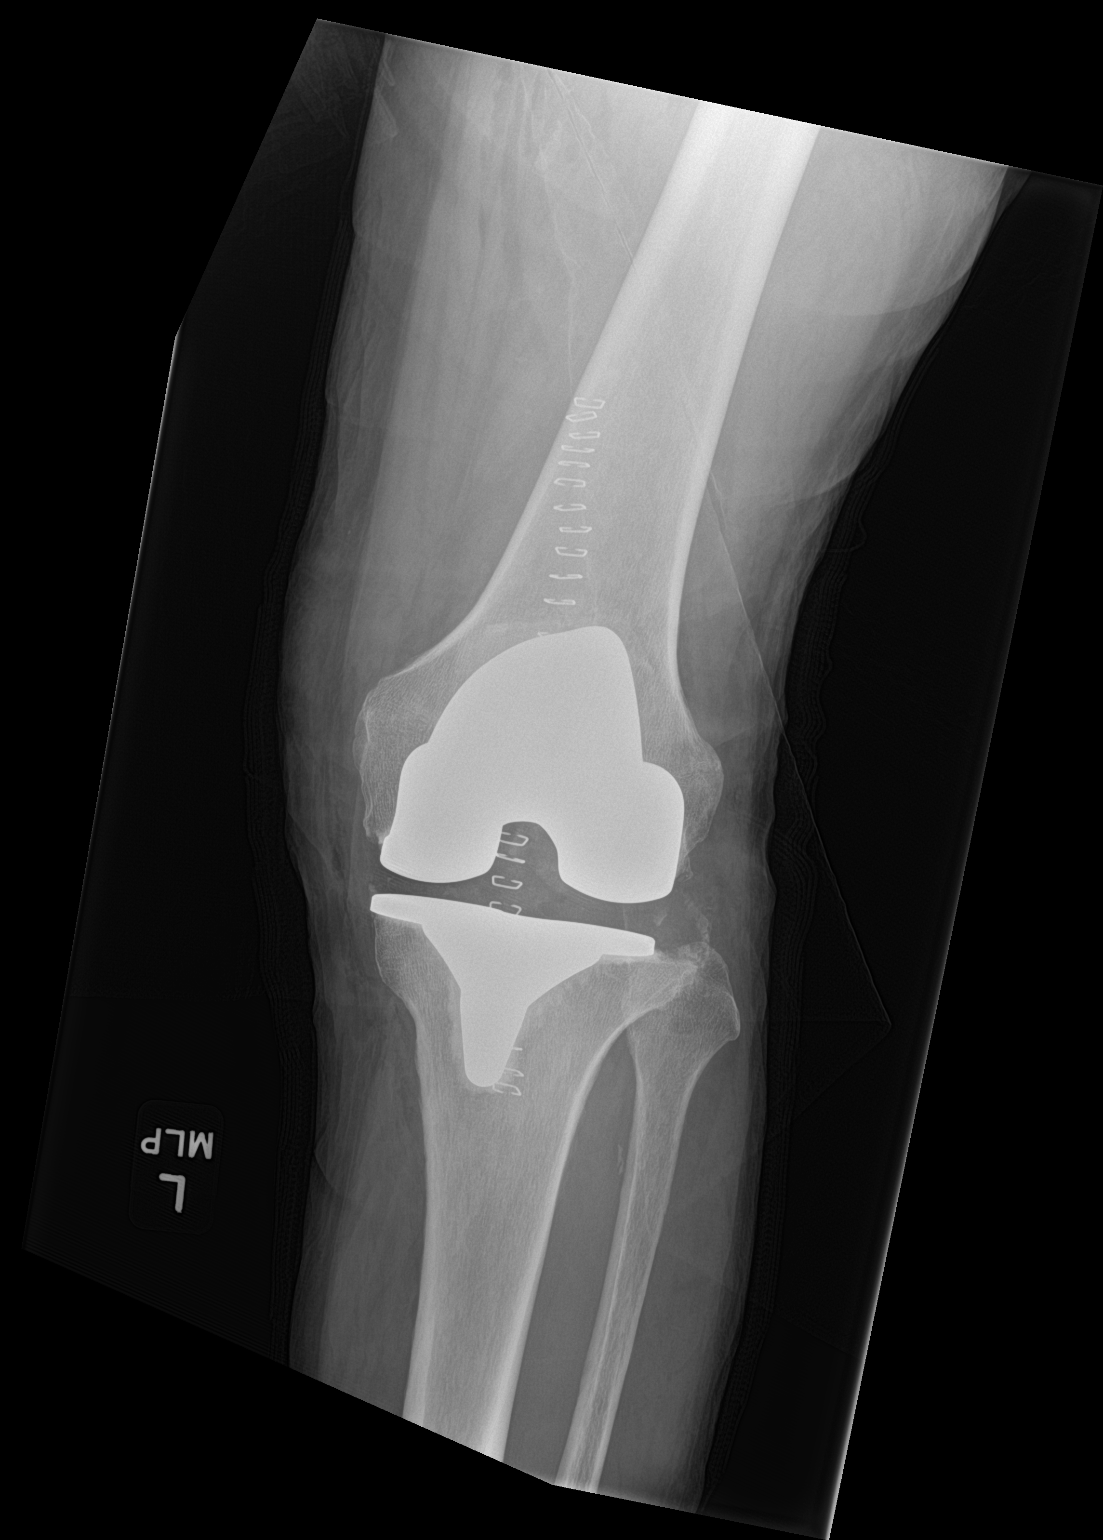

[knee lat]
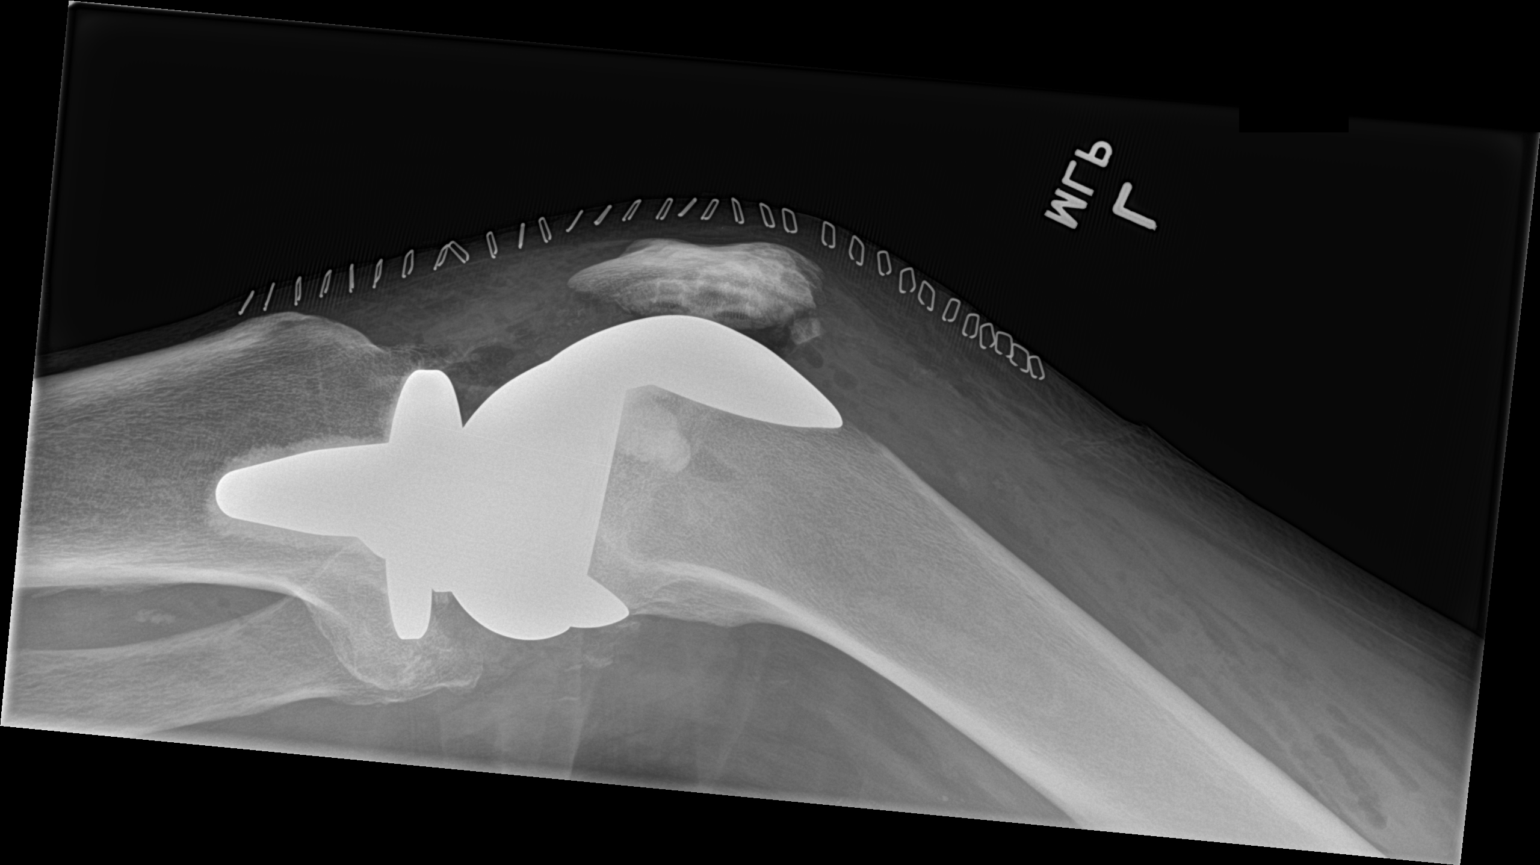

[2 of 2 positions shown; findings below may reference images not displayed]

FINDINGS: Patient is status post left total knee arthroplasty. Acetabular and
femoral components are well seated. Disc spacing is symmetric.
Components are well seated. Gas and fluid are present in the joint
as expected.
IMPRESSION: Left total knee arthroplasty without radiographic evidence for
complication.

## 2024-02-03 ENCOUNTER — Ambulatory Visit: Admitting: Cardiology

## 2024-02-03 NOTE — Progress Notes (Deleted)
 Clinical Summary Johnny Huber is a 78 y.o.male   seen today for follow up of the following medical problems.    1. CAD - former patient of [facility] cardiology - pci to RCA Nov 2015 after abnormal stress test. Normal LV function at that time.    - no chest pain. No SOB/DOE - compliant with meds    2. HTN - he is compliant with meds         3. Hyperlipidemia    09/2018 TC 113 TG 147 HDL 31 LDL 53 03/2020 TC 95, TG 220 HDL 30 LDL 21      4. AAA screen  - AAA screen 2017 no aneurysm   5. OSA - he is compliant with cpap     6. Gastroparesis - followed by GI at [facility]   7. Elevated LFTs - followed by GI - midly elevated AST/ALT to 40s-60s,      Daughter is a Engineer, civil (consulting), works as Scientist, forensic currently.  Past Medical History:  Diagnosis Date   Asthmatic bronchitis    as a child   Coronary atherosclerosis of native coronary artery    BMS to RCA 2015   DDD (degenerative disc disease), lumbar    Diabetes (HCC)    Gastroesophageal reflux disease    Hypertension    Insomnia, unspecified    Obstructive sleep apnea    uses cpap   Pneumonia    Prostate cancer (HCC)    had prostate removed   Restless legs syndrome    Rhinitis, allergic      No Known Allergies   Current Outpatient Medications  Medication Sig Dispense Refill   albuterol  (PROVENTIL  HFA;VENTOLIN  HFA) 108 (90 Base) MCG/ACT inhaler Inhale 2 puffs into the lungs every 6 (six) hours as needed for wheezing or shortness of breath.     amLODipine  (NORVASC ) 5 MG tablet Take 5 mg by mouth daily.     aspirin  EC 325 MG tablet Take 1 tablet (325 mg total) by mouth daily. MUST TAKE AT LEAST 4 WEEKS POSTOP 4 DVT PROPHYLAXIS (Patient taking differently: Take 81 mg by mouth daily.) 30 tablet 0   atorvastatin  (LIPITOR ) 80 MG tablet TAKE 1 TABLET DAILY 90 tablet 3   BD VEO INSULIN  SYRINGE U/F 31G X 15/64 0.3 ML MISC      Cyanocobalamin (B-12 COMPLIANCE INJECTION IJ) Inject as directed every 14 (fourteen) days.      DULoxetine  (CYMBALTA ) 30 MG capsule Take 30 mg by mouth daily.     glucose blood (FREESTYLE LITE) test strip Use as instructed bid 100 each 1   Insulin  Pen Needle (B-D ULTRAFINE III SHORT PEN) 31G X 8 MM MISC 1 each by Does not apply route as directed. 100 each 3   LANTUS  SOLOSTAR 100 UNIT/ML Solostar Pen Inject 28 Units into the skin every morning.     linaclotide  (LINZESS ) 72 MCG capsule Take 72 mcg by mouth daily as needed (Constipation). (Patient not taking: Reported on 10/29/2022)     loratadine  (CLARITIN ) 10 MG tablet Take 10 mg by mouth daily.     losartan  (COZAAR ) 50 MG tablet TAKE 1 TABLET DAILY (NEW MEDICATION 01/20/18) 30 tablet 0   metFORMIN  (GLUCOPHAGE -XR) 500 MG 24 hr tablet Take 500 mg by mouth daily with breakfast.     methocarbamol  (ROBAXIN ) 500 MG tablet Take 1 tablet (500 mg total) by mouth every 6 (six) hours as needed for muscle spasms. (Patient not taking: Reported on 10/29/2022) 60 tablet 0  mometasone (NASONEX) 50 MCG/ACT nasal spray Place 2 sprays into the nose daily as needed (Congestion).     Multiple Vitamins-Minerals (MULTIVITAMIN WITH MINERALS) tablet Take 1 tablet by mouth daily. Men's     nitroGLYCERIN  (NITROSTAT ) 0.4 MG SL tablet Place 0.4 mg under the tongue every 5 (five) minutes as needed for chest pain.     ondansetron  (ZOFRAN ) 8 MG tablet Take 8 mg by mouth 3 (three) times daily. (Patient not taking: Reported on 10/29/2022)     oxyCODONE -acetaminophen  (PERCOCET/ROXICET) 5-325 MG tablet Take 1 tablet by mouth every 4 (four) hours as needed for severe pain. 30 tablet 0   OZEMPIC, 1 MG/DOSE, 4 MG/3ML SOPN Inject into the skin. (Patient not taking: Reported on 10/29/2022)     pantoprazole  (PROTONIX ) 40 MG tablet Take 40 mg by mouth daily.     pregabalin (LYRICA) 150 MG capsule Take 150 mg by mouth 2 (two) times daily.     rOPINIRole  (REQUIP ) 1 MG tablet Take 1 mg by mouth 2 (two) times daily.      No current facility-administered medications for this visit.     Past  Surgical History:  Procedure Laterality Date   CARDIAC CATHETERIZATION  2015   CORONARY ANGIOPLASTY     OTHER SURGICAL HISTORY  1983   R KNEE ARTHORSCOPY x 3   PROSTATECTOMY  2011   TONSILLECTOMY     TOTAL KNEE ARTHROPLASTY Right 12/09/2017   CEMENTED    TOTAL KNEE ARTHROPLASTY Right 12/09/2017   Procedure: RIGHT TOTAL KNEE ARTHROPLASTY-CEMENTED;  Surgeon: Barbarann Oneil BROCKS, MD;  Location: [facility] OR;  Service: Orthopedics;  Laterality: Right;   TOTAL KNEE ARTHROPLASTY Left 10/07/2021   Procedure: LEFT TOTAL KNEE ARTHROPLASTY;  Surgeon: Barbarann Oneil BROCKS, MD;  Location: [facility] OR;  Service: Orthopedics;  Laterality: Left;     No Known Allergies    Family History  Problem Relation Age of Onset   Cancer Sister      Social History Johnny Huber reports that he quit smoking about 41 years ago. His smoking use included cigarettes. He has never used smokeless tobacco. Johnny Huber reports no history of alcohol use.   Review of Systems CONSTITUTIONAL: No weight loss, fever, chills, weakness or fatigue.  HEENT: Eyes: No visual loss, blurred vision, double vision or yellow sclerae.No hearing loss, sneezing, congestion, runny nose or sore throat.  SKIN: No rash or itching.  CARDIOVASCULAR:  RESPIRATORY: No shortness of breath, cough or sputum.  GASTROINTESTINAL: No anorexia, nausea, vomiting or diarrhea. No abdominal pain or blood.  GENITOURINARY: No burning on urination, no polyuria NEUROLOGICAL: No headache, dizziness, syncope, paralysis, ataxia, numbness or tingling in the extremities. No change in bowel or bladder control.  MUSCULOSKELETAL: No muscle, back pain, joint pain or stiffness.  LYMPHATICS: No enlarged nodes. No history of splenectomy.  PSYCHIATRIC: No history of depression or anxiety.  ENDOCRINOLOGIC: No reports of sweating, cold or heat intolerance. No polyuria or polydipsia.  Johnny Huber   Physical Examination There were no vitals filed for this visit. There were no vitals filed for this  visit.  Gen: resting comfortably, no acute distress HEENT: no scleral icterus, pupils equal round and reactive, no palptable cervical adenopathy,  CV Resp: Clear to auscultation bilaterally GI: abdomen is soft, non-tender, non-distended, normal bowel sounds, no hepatosplenomegaly MSK: extremities are warm, no edema.  Skin: warm, no rash Neuro:  no focal deficits Psych: appropriate affect   Diagnostic Studies 03/2016 AAA US  No anerusym   03/2014 cath Impression:  1.  Totally  occluded LAD with strong right to left collaterals 2.  Severe disease in the circumflex but small caliber vessel 3.  Severe disease in the future dominant right coronary artery that was responsible for collaterals to the LAD. 4.  Successful direct stenting with a bare-metal stent of the RCA 5.  Preserved left ventricular function           Assessment and Plan   1. CAD - denies any symptoms, continue current meds   2 .HTN -at goal, continue current meds   3. Hyperlipidemia - LDL at goal, we disucssed dietary changes to improve TGs - continue statin       Nylia Gavina F. Cataldo Cosgriff, M.D., F.A.C.C.

## 2024-02-04 ENCOUNTER — Ambulatory Visit: Attending: Cardiology | Admitting: Cardiology

## 2024-02-04 ENCOUNTER — Encounter: Payer: Self-pay | Admitting: Cardiology

## 2024-02-04 VITALS — BP 128/72 | HR 69 | Ht 71.0 in | Wt 243.0 lb

## 2024-02-04 DIAGNOSIS — I2510 Atherosclerotic heart disease of native coronary artery without angina pectoris: Secondary | ICD-10-CM | POA: Diagnosis present

## 2024-02-04 DIAGNOSIS — I10 Essential (primary) hypertension: Secondary | ICD-10-CM | POA: Insufficient documentation

## 2024-02-04 DIAGNOSIS — E782 Mixed hyperlipidemia: Secondary | ICD-10-CM | POA: Diagnosis present

## 2024-02-04 MED ORDER — ROSUVASTATIN CALCIUM 40 MG PO TABS: 40.0000 mg | ORAL_TABLET | Freq: Every day | ORAL | 3 refills | Status: AC

## 2024-02-04 NOTE — Progress Notes (Signed)
 Clinical Summary Mr. Mcomber is a 78 y.o.male seen today for follow up of the following medical problems.    1. CAD - former patient of [facility]ology - pci to RCA Nov 2015 after abnormal stress test. Normal LV function at that time.   - no chest pains, no SOB/DOE - compliant with meds      2. HTN - compliant with meds     3. Hyperlipidemia    09/2018 TC 113 TG 147 HDL 31 LDL 53 03/2020 TC 95, TG 220 HDL 30 LDL 21 - 09/2023 TC 845 TG 834 HDL 33 LDL 92      4. AAA screen  - AAA screen 2017 no aneurysm   5. OSA - he is compliant with cpap     6. Gastroparesis - followed by GI at [facility]        Daughter is a Engineer, civil (consulting), works as Scientist, forensic currently.  Past Medical History:  Diagnosis Date   Asthmatic bronchitis    as a child   Coronary atherosclerosis of native coronary artery    BMS to RCA 2015   DDD (degenerative disc disease), lumbar    Diabetes (HCC)    Gastroesophageal reflux disease    Hypertension    Insomnia, unspecified    Obstructive sleep apnea    uses cpap   Pneumonia    Prostate cancer (HCC)    had prostate removed   Restless legs syndrome    Rhinitis, allergic      No Known Allergies   Current Outpatient Medications  Medication Sig Dispense Refill   albuterol  (PROVENTIL  HFA;VENTOLIN  HFA) 108 (90 Base) MCG/ACT inhaler Inhale 2 puffs into the lungs every 6 (six) hours as needed for wheezing or shortness of breath.     amLODipine  (NORVASC ) 5 MG tablet Take 5 mg by mouth daily.     aspirin  EC 325 MG tablet Take 1 tablet (325 mg total) by mouth daily. MUST TAKE AT LEAST 4 WEEKS POSTOP 4 DVT PROPHYLAXIS (Patient taking differently: Take 81 mg by mouth daily.) 30 tablet 0   atorvastatin  (LIPITOR ) 80 MG tablet TAKE 1 TABLET DAILY 90 tablet 3   BD VEO INSULIN  SYRINGE U/F 31G X 15/64 0.3 ML MISC      Cyanocobalamin (B-12 COMPLIANCE INJECTION IJ) Inject as directed every 14 (fourteen) days.     DULoxetine  (CYMBALTA ) 30 MG capsule Take 30 mg  by mouth daily.     glucose blood (FREESTYLE LITE) test strip Use as instructed bid 100 each 1   Insulin  Pen Needle (B-D ULTRAFINE III SHORT PEN) 31G X 8 MM MISC 1 each by Does not apply route as directed. 100 each 3   LANTUS  SOLOSTAR 100 UNIT/ML Solostar Pen Inject 28 Units into the skin every morning.     linaclotide  (LINZESS ) 72 MCG capsule Take 72 mcg by mouth daily as needed (Constipation). (Patient not taking: Reported on 10/29/2022)     loratadine  (CLARITIN ) 10 MG tablet Take 10 mg by mouth daily.     losartan  (COZAAR ) 50 MG tablet TAKE 1 TABLET DAILY (NEW MEDICATION 01/20/18) 30 tablet 0   metFORMIN  (GLUCOPHAGE -XR) 500 MG 24 hr tablet Take 500 mg by mouth daily with breakfast.     methocarbamol  (ROBAXIN ) 500 MG tablet Take 1 tablet (500 mg total) by mouth every 6 (six) hours as needed for muscle spasms. (Patient not taking: Reported on 10/29/2022) 60 tablet 0   mometasone (NASONEX) 50 MCG/ACT nasal spray Place 2 sprays  into the nose daily as needed (Congestion).     Multiple Vitamins-Minerals (MULTIVITAMIN WITH MINERALS) tablet Take 1 tablet by mouth daily. Men's     nitroGLYCERIN  (NITROSTAT ) 0.4 MG SL tablet Place 0.4 mg under the tongue every 5 (five) minutes as needed for chest pain.     ondansetron  (ZOFRAN ) 8 MG tablet Take 8 mg by mouth 3 (three) times daily. (Patient not taking: Reported on 10/29/2022)     oxyCODONE -acetaminophen  (PERCOCET/ROXICET) 5-325 MG tablet Take 1 tablet by mouth every 4 (four) hours as needed for severe pain. 30 tablet 0   OZEMPIC, 1 MG/DOSE, 4 MG/3ML SOPN Inject into the skin. (Patient not taking: Reported on 10/29/2022)     pantoprazole  (PROTONIX ) 40 MG tablet Take 40 mg by mouth daily.     pregabalin (LYRICA) 150 MG capsule Take 150 mg by mouth 2 (two) times daily.     rOPINIRole  (REQUIP ) 1 MG tablet Take 1 mg by mouth 2 (two) times daily.      No current facility-administered medications for this visit.     Past Surgical History:  Procedure Laterality Date    CARDIAC CATHETERIZATION  2015   CORONARY ANGIOPLASTY     OTHER SURGICAL HISTORY  1983   R KNEE ARTHORSCOPY x 3   PROSTATECTOMY  2011   TONSILLECTOMY     TOTAL KNEE ARTHROPLASTY Right 12/09/2017   CEMENTED    TOTAL KNEE ARTHROPLASTY Right 12/09/2017   Procedure: RIGHT TOTAL KNEE ARTHROPLASTY-CEMENTED;  Surgeon: Barbarann Oneil BROCKS, MD;  Location: [facility] OR;  Service: Orthopedics;  Laterality: Right;   TOTAL KNEE ARTHROPLASTY Left 10/07/2021   Procedure: LEFT TOTAL KNEE ARTHROPLASTY;  Surgeon: Barbarann Oneil BROCKS, MD;  Location: [facility] OR;  Service: Orthopedics;  Laterality: Left;     No Known Allergies    Family History  Problem Relation Age of Onset   Cancer Sister      Social History Mr. Ormond reports that he quit smoking about 41 years ago. His smoking use included cigarettes. He has never used smokeless tobacco. Mr. Nygard reports no history of alcohol use.     Physical Examination Today's Vitals   02/04/24 0828  BP: 128/72  Pulse: 69  SpO2: 97%  Weight: 243 lb (110.2 kg)  Height: 5' 11 (1.803 m)   Body mass index is 33.89 kg/m.  Gen: resting comfortably, no acute distress HEENT: no scleral icterus, pupils equal round and reactive, no palptable cervical adenopathy,  CV: RRR, no m/rg, no jvd Resp: Clear to auscultation bilaterally GI: abdomen is soft, non-tender, non-distended, normal bowel sounds, no hepatosplenomegaly MSK: extremities are warm, no edema.  Skin: warm, no rash Neuro:  no focal deficits Psych: appropriate affect   Diagnostic Studies  03/2016 AAA US  No anerusym   03/2014 cath Impression:  1.  Totally occluded LAD with strong right to left collaterals 2.  Severe disease in the circumflex but small caliber vessel 3.  Severe disease in the future dominant right coronary artery that was responsible for collaterals to the LAD. 4.  Successful direct stenting with a bare-metal stent of the RCA 5.  Preserved left ventricular function    Assessment and Plan  1.  CAD - no symptoms, continue current meds - EKG today shows NSR, no acute ischemic changes.    2 .HTN -bp is at goal, continue current meds   3. Hyperlipidemia - LDL is above goal, continue current meds      Dorn PHEBE Ross, M.D.

## 2024-02-04 NOTE — Patient Instructions (Signed)
 Medication Instructions:  Your physician has recommended you make the following change in your medication:  Stop taking Atorvastatin   Start taking Rosuvastatin  40 mg once daily Continue taking all other medications as prescried  Labwork: None  Testing/Procedures: None  Follow-Up: Your physician recommends that you schedule a follow-up appointment in: 1 year. You will receive a reminder call in about 8 months reminding you to schedule your appointment. If you don't receive this call, please contact our office.   Any Other Special Instructions Will Be Listed Below (If Applicable). Thank you for choosing [facility]!     If you need a refill on your cardiac medications before your next appointment, please call your pharmacy.
